# Patient Record
Sex: Female | Born: 1955 | Race: Black or African American | Hispanic: No | Marital: Married | Smoking: Former smoker
Health system: Southern US, Community
[De-identification: ages and names within clinical notes are randomized; demographics above are authoritative.]

## PROBLEM LIST (undated history)

## (undated) DIAGNOSIS — G5711 Meralgia paresthetica, right lower limb: Secondary | ICD-10-CM

## (undated) DIAGNOSIS — K219 Gastro-esophageal reflux disease without esophagitis: Secondary | ICD-10-CM

## (undated) DIAGNOSIS — F419 Anxiety disorder, unspecified: Secondary | ICD-10-CM

## (undated) DIAGNOSIS — F32A Depression, unspecified: Secondary | ICD-10-CM

## (undated) DIAGNOSIS — I10 Essential (primary) hypertension: Secondary | ICD-10-CM

## (undated) DIAGNOSIS — F329 Major depressive disorder, single episode, unspecified: Secondary | ICD-10-CM

## (undated) HISTORY — DX: Essential (primary) hypertension: I10

## (undated) HISTORY — DX: Depression, unspecified: F32.A

## (undated) HISTORY — DX: Major depressive disorder, single episode, unspecified: F32.9

## (undated) HISTORY — DX: Anxiety disorder, unspecified: F41.9

## (undated) HISTORY — DX: Meralgia paresthetica, right lower limb: G57.11

## (undated) HISTORY — DX: Gastro-esophageal reflux disease without esophagitis: K21.9

## (undated) HISTORY — PX: KNEE CARTILAGE SURGERY: SHX688

---

## 2000-04-09 ENCOUNTER — Other Ambulatory Visit: Admission: RE | Admit: 2000-04-09 | Discharge: 2000-04-09 | Payer: Self-pay | Admitting: Obstetrics and Gynecology

## 2000-12-16 ENCOUNTER — Emergency Department (HOSPITAL_COMMUNITY): Admission: EM | Admit: 2000-12-16 | Discharge: 2000-12-16 | Payer: Self-pay | Admitting: Emergency Medicine

## 2001-06-30 ENCOUNTER — Other Ambulatory Visit: Admission: RE | Admit: 2001-06-30 | Discharge: 2001-06-30 | Payer: Self-pay | Admitting: Gynecology

## 2002-08-02 ENCOUNTER — Other Ambulatory Visit: Admission: RE | Admit: 2002-08-02 | Discharge: 2002-08-02 | Payer: Self-pay | Admitting: Gynecology

## 2003-08-03 ENCOUNTER — Other Ambulatory Visit: Admission: RE | Admit: 2003-08-03 | Discharge: 2003-08-03 | Payer: Self-pay | Admitting: Gynecology

## 2004-08-08 ENCOUNTER — Other Ambulatory Visit: Admission: RE | Admit: 2004-08-08 | Discharge: 2004-08-08 | Payer: Self-pay | Admitting: Gynecology

## 2005-10-22 ENCOUNTER — Other Ambulatory Visit: Admission: RE | Admit: 2005-10-22 | Discharge: 2005-10-22 | Payer: Self-pay | Admitting: Gynecology

## 2006-11-13 ENCOUNTER — Other Ambulatory Visit: Admission: RE | Admit: 2006-11-13 | Discharge: 2006-11-13 | Payer: Self-pay | Admitting: Gynecology

## 2007-05-05 ENCOUNTER — Encounter (INDEPENDENT_AMBULATORY_CARE_PROVIDER_SITE_OTHER): Payer: Self-pay | Admitting: Orthopedic Surgery

## 2007-05-05 ENCOUNTER — Ambulatory Visit (HOSPITAL_BASED_OUTPATIENT_CLINIC_OR_DEPARTMENT_OTHER): Admission: RE | Admit: 2007-05-05 | Discharge: 2007-05-05 | Payer: Self-pay | Admitting: Orthopedic Surgery

## 2008-01-19 ENCOUNTER — Ambulatory Visit (HOSPITAL_BASED_OUTPATIENT_CLINIC_OR_DEPARTMENT_OTHER): Admission: RE | Admit: 2008-01-19 | Discharge: 2008-01-19 | Payer: Self-pay | Admitting: Orthopedic Surgery

## 2010-07-03 NOTE — Op Note (Signed)
[name], [name]                ACCOUNT NO.:  [id number]   MEDICAL RECORD NO.:  [id number]          PATIENT TYPE:  AMB   LOCATION:  DSC                          FACILITY:  [facility]   PHYSICIAN:  Cindee Salt, M.D.       DATE OF BIRTH:  May 19, 1955   DATE OF PROCEDURE:  05/05/2007  DATE OF DISCHARGE:                               OPERATIVE REPORT   PREOPERATIVE DIAGNOSIS:  Carpal tunnel syndrome right hand, stenosing  tenosynovitis right ring, right thumb.   POSTOPERATIVE DIAGNOSIS:  Carpal tunnel syndrome right hand, stenosing  tenosynovitis right ring, right thumb, plus flexor sheath cyst right  ring finger.   OPERATION:  Right carpal tunnel release, release of A1 pulley right ring  finger with excision of cyst, release A1 pulley right thumb.   SURGEON:  Cindee Salt, M.D.   ASSISTANT:  Carolyne Fiscal R.N.   ANESTHESIA:  Forearm based IV regional.   ANESTHESIOLOGIST:  Dr. Sampson Goon.   HISTORY:  The patient is a 55 year old female with a history of carpal  tunnel syndrome, EMG nerve conductions positive which has not responded  to conservative treatment.  She also has triggering of her thumb and  ring finger also not responsive.  She has elected to undergo surgical  decompression of each of these.  She is aware of risks and complications  including infection, recurrence, injury to arteries, nerves, tendons  incomplete relief of symptoms, dystrophy.  In the preoperative area the  patient is seen, questions encouraged and answered, the extremity marked  by both the patient and surgeon.  Antibiotic given.   PROCEDURE:  The patient is brought to the operating room where a forearm  based IV regional anesthetic was carried out without difficulty.  She  was prepped using DuraPrep, supine position, right arm free.  A  transverse incision was made over the A1 pulley of the thumb, carried  down through subcutaneous tissue.  Bleeders were electrocauterized.  Retractors were placed, protecting the  radial and ulnar digital artery  and nerve on the radial aspect of the A1 pulley.  A release was then  performed.  The oblique pulley was left intact.  The thumb placed  through full range motion, no further triggering was evident.  The wound  was irrigated.  The skin closed with interrupted 5-0 Vicryl Rapide  sutures.  An oblique incision was then made over the metacarpophalangeal  joint of the ring finger, carried down through subcutaneous tissue.  Retractors again placed, protecting neurovascular structures both  radially and ulnarly.  A release was then performed on the radial side  of the A1 pulley.  A large cyst was present.  This was excised and sent  to pathology.  A small incision was made centrally in the A2 pulley.  Again a flexion/extension of the finger revealed no further triggering.  The wound was irrigated and skin closed with interrupted 5-0 Vicryl  Rapide sutures.  A separate incision was then made longitudinally in the  palm just to the radial side of the ring finger, carried down through  subcutaneous tissue.  Bleeders again electrocauterized.  Palmar fascia  was split, superficial palmar arch identified, flexor tendon to the ring  finger identified to the ulnar side of the median nerve.  Carpal  retinaculum was incised with sharp dissection.  A right-angle and Sewall  retractor were placed between skin and forearm fascia.  The fascia was  then released for approximately 1.5 cm proximal to the wrist crease  under direct vision.  The canal was explored.  Area of compression of  median nerve was apparent.  Persistent median artery was also present.  This was not thrombosed.  The wound was irrigated.  The skin was then  closed with interrupted 5-0 Vicryl Rapide sutures.  Sterile compressive  dressing and splint to the wrist, fingers free, was applied.  The  patient tolerated the procedure well and was taken to the recovery room  for observation in satisfactory condition.   She will be discharged home  to return to [facility] [place] in one week on Vicodin.           ______________________________  Cindee Salt, M.D.     GK/MEDQ  D:  05/05/2007  T:  05/05/2007  Job:  [id number]

## 2010-07-03 NOTE — Op Note (Signed)
[name], [name]                ACCOUNT NO.:  [id number]   MEDICAL RECORD NO.:  [id number]          PATIENT TYPE:  AMB   LOCATION:  DSC                          FACILITY:  [facility]   PHYSICIAN:  Cindee Salt, M.D.       DATE OF BIRTH:  19-Oct-1955   DATE OF PROCEDURE:  01/19/2008  DATE OF DISCHARGE:                               OPERATIVE REPORT   PREOPERATIVE DIAGNOSES:  1. Carpal tunnel syndrome, left hand.  2. Stenosing tenosynovitis, left ring finger.   POSTOPERATIVE DIAGNOSES:  1. Carpal tunnel syndrome, left hand.  2. Stenosing tenosynovitis, left ring finger.   OPERATION:  Release of A1 pulley left ring finger, carpal tunnel release  left hand.   SURGEON:  Cindee Salt, MD   ASSISTANT:  Carolyne Fiscal, RN   ANESTHESIA:  General.   ANESTHESIOLOGIST:  Burna Forts, MD.   HISTORY:  The patient is a 55 year old female with a history of carpal  tunnel syndrome.  EMG nerve conduction is positive which has not  responded to conservative treatment.  She has elected to undergo  surgical decompression.  Postoperative course had been discussed along  with risks and complications.  She is aware that there is no guarantee  with the surgery, possibility of infection, recurrence of injury to  arteries, nerves, tendons, incomplete relief of symptoms, dystrophy, and  possible recurrence of the stenosing tenosynovitis.  In the preoperative  area, the patient is seen.  The extremity marked by both the patient and  surgeon.  Antibiotic given.   PROCEDURE:  The patient was brought to the operating room where a  general anesthetic was carried out without difficulty under the  direction of Dr. Jacklynn Bue.  She was prepped using DuraPrep in supine  position with the left arm free.  A time-out was taken.  A longitudinal  incision was made in the palm, carried down through subcutaneous tissue.  Bleeders were electrocauterized.  Palmar fascia was split.  Superficial  palmar arch identified.  The  flexor tendon to the ring and little finger  identified to the ulnar side of the median nerve.  The carpal  retinaculum was incised with sharp dissection.  Right angle and Sewall  retractor were placed between skin and forearm fascia.  The fascia  released for approximately 1.5 cm proximal to the wrist crease under  direct vision.  Canal was explored.  Area compression to the nerve was  apparent.  No further lesions were identified.  The wound was irrigated  and closed with interrupted 5-0 Vicryl Rapide sutures.  A separate  incision was then made over the A1 pulley.  Obliquely, the left ring  finger carried down through the subcutaneous tissue.  Bleeders again  electrocauterized with bipolar.  The dissection carried down to the  flexor sheath.  The A1 pulley was then released on its radial aspect.  A  small incision was made centrally in the A2 pulley.  No further lesions  were identified.  Finger placed through full range motion, no further  triggering was noted.  The wound was again irrigated and closed with  interrupted 5-0 Vicryl Rapide  sutures.  Sterile compressive dressing and splint to the wrist was  applied.  The patient tolerated the procedure well, was taken to the  recovery room for observation in satisfactory condition.  She will be  discharged home to return to the [facility] [place] in 1 week on  Vicodin.           ______________________________  Cindee Salt, M.D.     GK/MEDQ  D:  01/19/2008  T:  01/19/2008  Job:  [id number]

## 2010-11-12 LAB — BASIC METABOLIC PANEL
GFR calc non Af Amer: >60
Potassium: 3.9
Sodium: 137

## 2010-11-12 LAB — POCT HEMOGLOBIN-HEMACUE: Hemoglobin: 13.9

## 2016-10-14 ENCOUNTER — Ambulatory Visit (INDEPENDENT_AMBULATORY_CARE_PROVIDER_SITE_OTHER): Payer: Commercial Managed Care - PPO | Admitting: Family Medicine

## 2016-10-14 ENCOUNTER — Encounter: Payer: Self-pay | Admitting: Family Medicine

## 2016-10-14 VITALS — BP 140/82 | HR 89 | Temp 98.1°F | Resp 16 | Ht 62.0 in | Wt 178.2 lb

## 2016-10-14 DIAGNOSIS — I10 Essential (primary) hypertension: Secondary | ICD-10-CM | POA: Diagnosis not present

## 2016-10-14 DIAGNOSIS — F419 Anxiety disorder, unspecified: Secondary | ICD-10-CM

## 2016-10-14 DIAGNOSIS — G4701 Insomnia due to medical condition: Secondary | ICD-10-CM

## 2016-10-14 DIAGNOSIS — F339 Major depressive disorder, recurrent, unspecified: Secondary | ICD-10-CM

## 2016-10-14 MED ORDER — SERTRALINE HCL 100 MG PO TABS: 200.0000 mg | ORAL_TABLET | Freq: Every day | ORAL | 0 refills | Status: DC

## 2016-10-14 MED ORDER — AMLODIPINE BESYLATE 5 MG PO TABS: 5.0000 mg | ORAL_TABLET | Freq: Every day | ORAL | 1 refills | Status: DC

## 2016-10-14 NOTE — Progress Notes (Signed)
Subjective:     Patient ID: [name], female   DOB: [birth year], 61 y.o.   MRN: [id number]  Here as a new patient visit. Has been struggling with mood for some time now. Reports that has tremendous family stress. Has been on the zoloft for about 7 months. Takes trazodone for sleep. Uses xanax for helping with sleep at night. Reports that can take xanax up to two pills and still does not sleep.  Had been on Wellbutrin and Prozac in the past for depression. Has no interest in doing anything that used to enjoy doing. Reports that spends most of time just taking care of mom and then when she is not doing that, she does not have the motivation. Has two sisters that help to take care of mom. Reports that she has not told them how she feels or that she is suffering with depression. Has never thought of hurting herself. No alcohol or drug use.   Needs refill on BP medication. Has never had labs done to check thyroid. Has taken BP medication, has not had any problems with it. 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    Anxiety  Presents for initial visit. Episode onset: Has dealth with anxiety and depression for the past several years.  The problem has been gradually worsening. Symptoms include decreased concentration, depressed mood, insomnia and nervous/anxious behavior. Patient reports no chest pain, compulsions, dizziness, dry mouth, feeling of choking, irritability, malaise, muscle tension, nausea, palpitations, panic, shortness of breath or suicidal ideas. Symptoms occur occasionally. The severity of symptoms is mild. The symptoms are aggravated by family issues and social activities. The quality of sleep is poor. Nighttime awakenings: one to two.   There is no history of suicide attempts. Past treatments include benzodiazephines, SSRIs, non-SSRI antidepressants and lifestyle changes.  Depression         This is a recurrent problem.  The current  episode started more than 1 month ago.   The onset quality is gradual.   The problem occurs daily.  Associated symptoms include decreased concentration, fatigue, hopelessness, insomnia, decreased interest, body aches and sad.  Associated symptoms include no appetite change, no myalgias, no headaches, no indigestion and no suicidal ideas.     The symptoms are aggravated by family issues.  Past treatments include SSRIs - Selective serotonin reuptake inhibitors and SNRIs - Serotonin and norepinephrine reuptake inhibitors.  Compliance with treatment is good.  Previous treatment provided mild relief.  Risk factors include history of self-injury.   Past medical history includes chronic illness and anxiety.     Pertinent negatives include no thyroid problem and no suicide attempts. Hypertension  This is a chronic problem. The current episode started more than 1 year ago. The problem has been waxing and waning since onset. The problem is controlled (BP runs well when she takes her medication). Associated symptoms include anxiety. Pertinent negatives include no chest pain, headaches, orthopnea, palpitations, peripheral edema, shortness of breath or sweats. There are no associated agents to hypertension. Risk factors for coronary artery disease include family history, stress and sedentary lifestyle. Past treatments include calcium channel blockers. The current treatment provides moderate improvement. Compliance problems include diet, exercise and psychosocial issues.  There is no history of angina, kidney disease or CAD/MI. There is no history of chronic renal disease or a thyroid problem.   Review of Systems  Constitutional: Positive for fatigue. Negative for appetite change and irritability.  Eyes: Negative  for visual disturbance.  Respiratory: Negative for shortness of breath.   Cardiovascular: Negative for chest pain, palpitations and orthopnea.  Gastrointestinal: Negative for nausea.  Endocrine: Positive for  polyuria.  Musculoskeletal: Negative for myalgias.  Skin: Negative for rash.  Neurological: Negative for dizziness and headaches.  Psychiatric/Behavioral: Positive for decreased concentration and depression. Negative for suicidal ideas. The patient is nervous/anxious and has insomnia.    Past Medical History:  Diagnosis Date  . Anxiety   . Depression   . GERD (gastroesophageal reflux disease)   . Hypertension     There is no immunization history on file for this patient. Social History   Social History  . Marital status: Married    Spouse name: N/A  . Number of children: N/A  . Years of education: N/A   Occupational History  . Not on file.   Social History Main Topics  . Smoking status: Former Research scientist (life sciences)  . Smokeless tobacco: Never Used  . Alcohol use No  . Drug use: No  . Sexual activity: Yes    Birth control/ protection: Post-menopausal   Other Topics Concern  . Not on file   Social History Narrative   Married. 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 Spends every 3rd night at [facility]. Has been taking care of mother since 89.    Family History  Problem Relation Age of Onset  . Alzheimer's disease Mother   . Hypertension Sister   . Diabetes Maternal Uncle   . Hypertension Sister        Objective:   Physical Exam  Constitutional: She is oriented to person, place, and time. She appears well-developed and well-nourished. No distress.  HENT:  Head: Normocephalic.  Mouth/Throat: No oropharyngeal exudate.  Eyes: Pupils are equal, round, and reactive to light. Conjunctivae are normal.  Neck: Normal range of motion. Neck supple. No JVD present. No tracheal deviation present. No thyromegaly present.  Cardiovascular: Normal rate and regular rhythm.   Pulmonary/Chest: Effort normal and breath sounds normal. No respiratory distress. She has no wheezes. She exhibits no tenderness.  Abdominal: Soft. Bowel sounds are normal. She exhibits no distension.   Musculoskeletal: She exhibits no edema.  Lymphadenopathy:    She has no cervical adenopathy.  Neurological: She is alert and oriented to person, place, and time. No cranial nerve deficit.  Skin: Skin is warm and dry. No rash noted. She is not diaphoretic. No erythema.  Psychiatric: Her behavior is normal. Thought content normal. She is not agitated, not aggressive, not hyperactive, not slowed, not withdrawn and not actively hallucinating. Thought content is not paranoid and not delusional. Cognition and memory are not impaired. She does not express impulsivity or inappropriate judgment. She exhibits a depressed mood. She expresses no homicidal and no suicidal ideation. She expresses no suicidal plans and no homicidal plans. She exhibits normal recent memory and normal remote memory. She is attentive.   Vitals:   10/14/16 1441  BP: 140/82  Pulse: 89  Resp: 16  Temp: 98.1 F (36.7 C)  SpO2: 98%          Plan and Assessment:     1. Depression, recurrent ([place]),, uncontrolled -Discussed with patient in detail treatment of depression and side effects of depression. Discussed medication. Discussed risks and benefits of medications. Discussed that if patient felt like she wanted to harm herself or anyone else that she would call 911 or seek help. Patient voiced agreement and understanding. Discussed ways to help with depression. -Patient to start  walking each day as directed.  - sertraline (ZOLOFT) 100 MG tablet; Take 2 tablets (200 mg total) by mouth daily.  Dispense: 60 tablet; Refill: 0 Will increase the zoloft as directed.  -Has not been taking the trazodone. Will start with Trazodone 50 mg po qhs. She will not take more than xanax 1mg  po qhs. Will begin decreasing the xanax. Discussed in detail the risks of addiction, hablit forming, sedation, etc of xanax. Patient voiced understanding.   2. Anxiety -.secondary to family stress.Defers counseling at this time. Agrees to discuss with sisters  need for help and will consider other care options for her mother.  - sertraline (ZOLOFT) 100 MG tablet; Take 2 tablets (200 mg total) by mouth daily.  Dispense: 60 tablet; Refill: 0 -patient does not need refill of xanax at this time, but understands that this is not a good management for anxiety or sleep.   3. HTN, goal below 130/80  -Check BMP.  Diet and exercise discussed.  - amLODipine (NORVASC) 5 MG tablet; Take 1 tablet (5 mg total) by mouth daily.; Refill: 1  4. 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  Avoid caffeine.  Exercise early in the day. Suspect that insomnia is secondary to above.  Trazodone for sleep as directed. Has not been taking at all. So take, trazodone 50 mg, po qhs as directed.  Medication risks discussed with patient.    Suspect that fatigue is secondary to above problems. But check labs and follow up as directed.  Call with questions or concerns or worsening changes in mood.  Sleep hygiene discussed with patient.  Spend over 50% of OV counseling patient on medications, diet, sleep, exercise, depression, anxiety.

## 2016-10-16 ENCOUNTER — Telehealth: Payer: Self-pay | Admitting: Family Medicine

## 2016-10-16 DIAGNOSIS — I10 Essential (primary) hypertension: Secondary | ICD-10-CM

## 2016-10-16 DIAGNOSIS — Z79899 Other long term (current) drug therapy: Secondary | ICD-10-CM

## 2016-10-16 DIAGNOSIS — F419 Anxiety disorder, unspecified: Secondary | ICD-10-CM

## 2016-10-16 DIAGNOSIS — R5383 Other fatigue: Secondary | ICD-10-CM

## 2016-10-16 DIAGNOSIS — F339 Major depressive disorder, recurrent, unspecified: Secondary | ICD-10-CM

## 2016-10-16 MED ORDER — SERTRALINE HCL 100 MG PO TABS: 200.0000 mg | ORAL_TABLET | Freq: Every day | ORAL | 0 refills | Status: DC

## 2016-10-16 MED ORDER — AMLODIPINE BESYLATE 5 MG PO TABS: 5.0000 mg | ORAL_TABLET | Freq: Every day | ORAL | 1 refills | Status: DC

## 2016-10-16 NOTE — Telephone Encounter (Signed)
Called patient regarding message below. No answer, left generic message for patient to return call.   

## 2016-10-16 NOTE — Telephone Encounter (Signed)
Patient called [facility] and they dont have the BP medication and Zoloft.  Pt will be taking 200 mg of the Zoloft instead of 100 mg.  Was that changed?  Please advise.  She called them late yesterday.  Please let her know when this has been sent in.  Also patient was taking 2 xanax and 2 advil PM and instead last night she was to take 50 mg of trazadone at bedtime and 1 xanax 1mg  tablet at bedtime. She wants to let you know she did not sleep well at all.  Does not feel rested today.  [place] to leave VM

## 2016-10-16 NOTE — Telephone Encounter (Signed)
Medications refaxed to pharmacy. Message sent to PCP for review.

## 2016-10-16 NOTE — Telephone Encounter (Signed)
Please advise patient that she does need to decrease the xanax as we discussed. She is going to probably not sleep well for the first several nights, but the trazodone will help with her sleep and give her more benefit.  Please also try to do the relaxation and exercise if possible during the day to help with her sleep hygiene. Please keep follow up appointment and let us know if she is having any problems.

## 2016-10-17 NOTE — Telephone Encounter (Signed)
I have ordered the labs, please advise patient that she should be fasting when she gets the labs done. You can read her the labs that I have ordered. Thanks.

## 2016-10-17 NOTE — Telephone Encounter (Signed)
Patient informed of message below, verbalized understanding.  

## 2016-10-17 NOTE — Telephone Encounter (Signed)
Spoke to patient regarding all information below. She is planning on getting necessary labs 1 weeks prior to next visit. What labs need to be ordered for her?

## 2016-10-18 ENCOUNTER — Ambulatory Visit: Payer: Self-pay | Admitting: Family Medicine

## 2016-11-06 ENCOUNTER — Other Ambulatory Visit: Payer: Self-pay | Admitting: Family Medicine

## 2016-11-06 NOTE — Progress Notes (Signed)
Patient ID: [name], female    DOB: [birth year], 61 y.o.   MRN: [id number]  Chief Complaint  Patient presents with  . Follow-up  . Hypertension  . Depression    Allergies Patient has no allergy information on record.  Subjective:   [name] is a 61 y.o. female who presents to [facility] today.  HPI Here for follow up. Has been feeling a bit better. Taking zoloft 100 mg, two a day. Takes one xanax and two trazodone each night. Reports that is feeling better. Does not feel quite as stressed. Sleeping at night. The days after stays with mom during the night and there the next day, has to recover. Takes care of uncle too. When goes home the next day, usually messes around the house and does laundry/clean. Has not been doing any exercise. Still have knee issues where surgery was done. Waiting for attorney to call.   Here to discuss labs and recheck BP.   Hyperlipidemia  This is a new problem. This is a new diagnosis. The problem is uncontrolled. Recent lipid tests were reviewed and are high. Exacerbating diseases include obesity. She has no history of diabetes. Factors aggravating her hyperlipidemia include fatty foods. Pertinent negatives include no chest pain, focal sensory loss, focal weakness, leg pain, myalgias or shortness of breath. She is currently on no antihyperlipidemic treatment. Compliance problems include adherence to diet and adherence to exercise.  Risk factors for coronary artery disease include dyslipidemia, family history, hypertension, obesity, stress, a sedentary lifestyle and post-menopausal.    Past Medical History:  Diagnosis Date  . Anxiety   . Depression   . GERD (gastroesophageal reflux disease)   . Hypertension     Past Surgical History:  Procedure Laterality Date  . KNEE CARTILAGE SURGERY Right    Workers Compensation, fell out of chair at work.     Family History  Problem Relation Age of Onset  . Alzheimer's disease Mother     . Hypertension Sister   . Diabetes Maternal Uncle   . Hypertension Sister      Social History   Social History  . Marital status: Married    Spouse name: N/A  . Number of children: N/A  . Years of education: N/A   Social History Main Topics  . Smoking status: Former Research scientist (life sciences)  . Smokeless tobacco: Never Used  . Alcohol use No  . Drug use: No  . Sexual activity: Yes    Birth control/ protection: Post-menopausal   Other Topics Concern  . None   Social History Narrative   Married. 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 Spends every 3rd night at [facility] [employer]. Has been taking care of mother since 45.     Review of Systems  Respiratory: Negative for shortness of breath.   Cardiovascular: Negative for chest pain.  Musculoskeletal: Negative for myalgias.  Neurological: Negative for focal weakness.    Current Outpatient Prescriptions on File Prior to Visit  Medication Sig Dispense Refill  . ALPRAZolam (XANAX) 1 MG tablet Take 1 mg by mouth at bedtime as needed.  1  . aspirin EC 81 MG tablet Take 81 mg by mouth daily.    . Cholecalciferol (D3-1000) 1000 units capsule Take 4,000 Units by mouth daily.    . Multiple Vitamin (MULTIVITAMIN) capsule Take 1 capsule by mouth daily.     No current facility-administered medications on file prior to visit.     Objective:   BP 120/82 (BP Location:  Left Arm, Patient Position: Sitting, Cuff Size: Normal)   Pulse 91   Temp 97.8 F (36.6 C) (Other (Comment))   Resp 16   Ht 5\' 2"  (1.575 m)   Wt 180 lb 8 oz (81.9 kg)   SpO2 98%   BMI 33.01 kg/m   Physical Exam  Constitutional: She is oriented to person, place, and time. She appears well-developed and well-nourished.  HENT:  Head: Normocephalic and atraumatic.  Eyes: Pupils are equal, round, and reactive to light. EOM are normal.  Neck: Normal range of motion. Neck supple. No JVD present. No tracheal deviation present. No thyromegaly present.  Cardiovascular: Normal  rate and regular rhythm.   Pulmonary/Chest: Effort normal and breath sounds normal.  Lymphadenopathy:    She has no cervical adenopathy.  Neurological: She is alert and oriented to person, place, and time. No cranial nerve deficit.  Skin: Skin is warm and dry. No rash noted.  Psychiatric: She has a normal mood and affect. Her behavior is normal. Judgment and thought content normal.  Vitals reviewed.    Assessment and Plan   1. HTN, goal below 130/80. Lifestyle modifications discussed with patient including a diet emphasizing vegetables, fruits, and whole grains. Limiting intake of sodium to less than 2,400 mg per day.  Recommendations discussed include consuming low-fat dairy products, poultry, fish, legumes, non-tropical vegetable oils, and nuts; and limiting intake of sweets, sugar-sweetened beverages, and red meat. Discussed following a plan such as the Dietary Approaches to Stop Hypertension (DASH) diet. Patient to read up on this diet.  Continue norvasc qd. Patient counseled in detail regarding the risks of medication. Told to call or return to clinic if develop any worrisome signs or symptoms. Patient voiced understanding.    2. Insomnia, unspecified type Patient went back to her previous level of trazodone, which we discussed is an elevated dose, but she reports it is working. She will continue medication. Has decreased the xanax to 1 at bedtime. Now will decreased the xanax to 1/2 po qd. Understands the risks of xanax.  - traZODone (DESYREL) 100 MG tablet; Take two pills each night as directed.  Dispense: 180 tablet; Refill: 0  3. Hyperlipidemia LDL goal <100 Discussed cholesterol goals and ways to lower cholesterol. Would like to try diet modifications and recheck it in one month. Spent time discussing this with patient and lifestyle changes. Weight loss and exercise discussed. Future lab for lipid panel placed along with liver tests.   4. Need for immunization against  influenza  - Flu Vaccine QUAD 36+ mos IM  5.  Anxiety Improved. Continue the zoloft and trazodone. Relaxation and stress management discussed.   Return in about 2 months (around 01/12/2017) for cholesterol . Caren Macadam, MD 11/12/2016

## 2016-11-12 ENCOUNTER — Encounter: Payer: Self-pay | Admitting: Family Medicine

## 2016-11-12 ENCOUNTER — Telehealth: Payer: Self-pay

## 2016-11-12 ENCOUNTER — Ambulatory Visit (INDEPENDENT_AMBULATORY_CARE_PROVIDER_SITE_OTHER): Payer: Commercial Managed Care - PPO | Admitting: Family Medicine

## 2016-11-12 VITALS — BP 120/82 | HR 91 | Temp 97.8°F | Resp 16 | Ht 62.0 in | Wt 180.5 lb

## 2016-11-12 DIAGNOSIS — Z23 Encounter for immunization: Secondary | ICD-10-CM | POA: Diagnosis not present

## 2016-11-12 DIAGNOSIS — E785 Hyperlipidemia, unspecified: Secondary | ICD-10-CM | POA: Diagnosis not present

## 2016-11-12 DIAGNOSIS — F419 Anxiety disorder, unspecified: Secondary | ICD-10-CM

## 2016-11-12 DIAGNOSIS — F339 Major depressive disorder, recurrent, unspecified: Secondary | ICD-10-CM

## 2016-11-12 DIAGNOSIS — G4700 Insomnia, unspecified: Secondary | ICD-10-CM | POA: Diagnosis not present

## 2016-11-12 DIAGNOSIS — I10 Essential (primary) hypertension: Secondary | ICD-10-CM | POA: Diagnosis not present

## 2016-11-12 LAB — BASIC METABOLIC PANEL
CALCIUM: 9
CHLORIDE: 108
Carbon Dioxide, Total: 29
Creatine, Serum: 0.89
EGFR (African American): 82
EGFR (Non-African Amer.): 70
GLUCOSE: 94
POTASSIUM: 4
Sodium: 143
Urea Nitrogen: 8

## 2016-11-12 LAB — CBC WITH DIFFERENTIAL/PLATELET
BASOS ABS: 47
Basophils Absolute: 47 cells/uL (ref 0–200)
Basophils Relative: 0.5 %
Basophils: 0.5
EOS PCT: 2.2 %
Eosinophil: 2.2
Eosinophils Absolute: 207 cells/uL (ref 15–500)
Eosinophils Absolute: 207
HCT: 35.9 % (ref 35.0–45.0)
HCT: 36
Hemoglobin: 12 g/dL (ref 11.7–15.5)
Hemoglobin: 12
LYMPHO ABS: 1570 /uL
LYMPHS PCT: 16.7
Lymphs Abs: 1570 cells/uL (ref 850–3900)
MCH: 27.8 pg (ref 27.0–33.0)
MCH: 27.8
MCHC: 33.4 g/dL (ref 32.0–36.0)
MCHC: 33.4
MCV: 83.1 fL (ref 80.0–100.0)
MCV: 83.1
MONO ABS: 498
MPV: 9.2 fL (ref 7.5–12.5)
MPV: 9.2 fL (ref 7.5–11.5)
Monocytes Relative: 5.3 %
Monocytes: 5.3
NEUTROS PCT: 75.3
Neutro Abs: 7078 cells/uL (ref 1500–7800)
Neutrophils Absolute: 7078 /uL
Neutrophils Relative %: 75.3 %
PLATELET COUNT: 317
PLATELETS: 317 10*3/uL (ref 140–400)
RBC: 4.32 10*6/uL (ref 3.80–5.10)
RDW: 13.6 % (ref 11.0–15.0)
RDW: 13.6
Red Blood Cell Count: 4.32
TOTAL LYMPHOCYTE: 16.7 %
WBC mixed population: 498 cells/uL (ref 200–950)
WBC: 9.4 10*3/uL (ref 3.8–10.8)
White Blood Cells: 9.4

## 2016-11-12 LAB — HEMOGLOBIN A1C
EAG (MMOL/L): 6.2 (calc)
EAG (MMOL/L): 6.2
HEMOGLOBIN A1C: 5.5
Hgb A1c MFr Bld: 5.5 % of total Hgb (ref <5.7)
Mean Plasma Glucose: 111 (calc)

## 2016-11-12 LAB — TSH
TSH: 1.23 m[IU]/L (ref 0.40–4.50)
TSH: 1.23

## 2016-11-12 LAB — LIPID PANEL
CHOLESTEROL: 217 mg/dL — AB (ref <200)
Cholesterol, Total: 217
HDL Cholesterol: 61
HDL: 61 mg/dL (ref >50)
LDL CALC: 140
LDL Cholesterol (Calc): 140 mg/dL (calc) — ABNORMAL HIGH
NON-HDL CHOLESTEROL (CALC): 156 mg/dL — AB (ref <130)
Non HDL Cholesterol: 156
Total CHOL/HDL Ratio: 3.6 (calc) (ref <5.0)
Total CHOL/HDL Ratio: 3.6
Triglycerides: 66 mg/dL (ref <150)
Triglycerides: 66

## 2016-11-12 LAB — BASIC METABOLIC PANEL WITH GFR
BUN: 8 mg/dL (ref 7–25)
CALCIUM: 9 mg/dL (ref 8.6–10.4)
CHLORIDE: 108 mmol/L (ref 98–110)
CO2: 29 mmol/L (ref 20–32)
CREATININE: 0.89 mg/dL (ref 0.50–0.99)
GFR, EST AFRICAN AMERICAN: 82 mL/min/{1.73_m2} (ref >=60)
GFR, Est Non African American: 70 mL/min/{1.73_m2} (ref >=60)
Glucose, Bld: 94 mg/dL (ref 65–99)
Potassium: 4 mmol/L (ref 3.5–5.3)
Sodium: 143 mmol/L (ref 135–146)

## 2016-11-12 MED ORDER — SERTRALINE HCL 100 MG PO TABS: 200.0000 mg | ORAL_TABLET | Freq: Every day | ORAL | 0 refills | Status: DC

## 2016-11-12 MED ORDER — AMLODIPINE BESYLATE 5 MG PO TABS: 5.0000 mg | ORAL_TABLET | Freq: Every day | ORAL | 1 refills | Status: DC

## 2016-11-12 MED ORDER — TRAZODONE HCL 100 MG PO TABS: ORAL_TABLET | ORAL | 0 refills | Status: DC

## 2016-11-12 NOTE — Patient Instructions (Addendum)
Cholesterol Cholesterol is a white, waxy, fat-like substance that is needed by the human body in small amounts. The liver makes all the cholesterol we need. Cholesterol is carried from the liver by the blood through the blood vessels. Deposits of cholesterol (plaques) may build up on blood vessel (artery) walls. Plaques make the arteries narrower and stiffer. Cholesterol plaques increase the risk for heart attack and stroke. You cannot feel your cholesterol level even if it is very high. The only way to know that it is high is to have a blood test. Once you know your cholesterol levels, you should keep a record of the test results. Work with your health care provider to keep your levels in the desired range. What do the results mean?  Total cholesterol is a rough measure of all the cholesterol in your blood.  LDL (low-density lipoprotein) is the "bad" cholesterol. This is the type that causes plaque to build up on the artery walls. You want this level to be low.  HDL (high-density lipoprotein) is the "good" cholesterol because it cleans the arteries and carries the LDL away. You want this level to be high.  Triglycerides are fat that the body can either burn for energy or store. High levels are closely linked to heart disease. What are the desired levels of cholesterol?  Total cholesterol below 200.  LDL below 100 for people who are at risk, below 70 for people at very high risk.  HDL above 40 is good. A level of 60 or higher is considered to be protective against heart disease.  Triglycerides below 150. How can I lower my cholesterol? Diet Follow your diet program as told by your health care provider.  Choose fish or white meat chicken and [place], roasted or baked. Limit fatty cuts of red meat, fried foods, and processed meats, such as sausage and lunch meats.  Eat lots of fresh fruits and vegetables.  Choose whole grains, beans, pasta, potatoes, and cereals.  Choose olive oil, corn  oil, or canola oil, and use only small amounts.  Avoid butter, mayonnaise, shortening, or palm kernel oils.  Avoid foods with trans fats.  Drink skim or nonfat milk and eat low-fat or nonfat yogurt and cheeses. Avoid whole milk, cream, ice cream, egg yolks, and full-fat cheeses.  Healthier desserts include angel food cake, ginger snaps, animal crackers, hard candy, popsicles, and low-fat or nonfat frozen yogurt. Avoid pastries, cakes, pies, and cookies.  Exercise  Follow your exercise program as told by your health care provider. A regular program: ? Helps to decrease LDL and raise HDL. ? Helps with weight control.  Do things that increase your activity level, such as gardening, walking, and taking the stairs.  Ask your health care provider about ways that you can be more active in your daily life.  Medicine  Take over-the-counter and prescription medicines only as told by your health care provider. ? Medicine may be prescribed by your health care provider to help lower cholesterol and decrease the risk for heart disease. This is usually done if diet and exercise have failed to bring down cholesterol levels. ? If you have several risk factors, you may need medicine even if your levels are normal.  This information is not intended to replace advice given to you by your health care provider. Make sure you discuss any questions you have with your health care provider. Document Released: 10/30/2000 Document Revised: 09/02/2015 Document Reviewed: 08/05/2015 Elsevier Interactive Patient Education  2017 [place]. High Cholesterol  High cholesterol is a condition in which the blood has high levels of a white, waxy, fat-like substance (cholesterol). The human body needs small amounts of cholesterol. The liver makes all the cholesterol that the body needs. Extra (excess) cholesterol comes from the food that we eat. Cholesterol is carried from the liver by the blood through the blood vessels.  If you have high cholesterol, deposits (plaques) may build up on the walls of your blood vessels (arteries). Plaques make the arteries narrower and stiffer. Cholesterol plaques increase your risk for heart attack and stroke. Work with your health care provider to keep your cholesterol levels in a healthy range. What increases the risk? This condition is more likely to develop in people who:  Eat foods that are high in animal fat (saturated fat) or cholesterol.  Are overweight.  Are not getting enough exercise.  Have a family history of high cholesterol.  What are the signs or symptoms? There are no symptoms of this condition. How is this diagnosed? This condition may be diagnosed from the results of a blood test.  If you are older than age 44, your health care provider may check your cholesterol every 4-6 years.  You may be checked more often if you already have high cholesterol or other risk factors for heart disease.  The blood test for cholesterol measures:  "Bad" cholesterol (LDL cholesterol). This is the main type of cholesterol that causes heart disease. The desired level for LDL is less than 100.  "Good" cholesterol (HDL cholesterol). This type helps to protect against heart disease by cleaning the arteries and carrying the LDL away. The desired level for HDL is 60 or higher.  Triglycerides. These are fats that the body can store or burn for energy. The desired number for triglycerides is lower than 150.  Total cholesterol. This is a measure of the total amount of cholesterol in your blood, including LDL cholesterol, HDL cholesterol, and triglycerides. A healthy number is less than 200.  How is this treated? This condition is treated with diet changes, lifestyle changes, and medicines. Diet changes  This may include eating more whole grains, fruits, vegetables, nuts, and fish.  This may also include cutting back on red meat and foods that have a lot of added  sugar. Lifestyle changes  Changes may include getting at least 40 minutes of aerobic exercise 3 times a week. Aerobic exercises include walking, biking, and swimming. Aerobic exercise along with a healthy diet can help you maintain a healthy weight.  Changes may also include quitting smoking. Medicines  Medicines are usually given if diet and lifestyle changes have failed to reduce your cholesterol to healthy levels.  Your health care provider may prescribe a statin medicine. Statin medicines have been shown to reduce cholesterol, which can reduce the risk of heart disease. Follow these instructions at home: Eating and drinking  If told by your health care provider:  Eat chicken (without skin), fish, veal, shellfish, ground [place] breast, and round or loin cuts of red meat.  Do not eat fried foods or fatty meats, such as hot dogs and salami.  Eat plenty of fruits, such as apples.  Eat plenty of vegetables, such as broccoli, potatoes, and carrots.  Eat beans, peas, and lentils.  Eat grains such as barley, rice, couscous, and bulgur wheat.  Eat pasta without cream sauces.  Use skim or nonfat milk, and eat low-fat or nonfat yogurt and cheeses.  Do not eat or drink whole milk, cream, ice cream,  egg yolks, or hard cheeses.  Do not eat stick margarine or tub margarines that contain trans fats (also called partially hydrogenated oils).  Do not eat saturated tropical oils, such as coconut oil and palm oil.  Do not eat cakes, cookies, crackers, or other baked goods that contain trans fats.  General instructions  Exercise as directed by your health care provider. Increase your activity level with activities such as gardening, walking, and taking the stairs.  Take over-the-counter and prescription medicines only as told by your health care provider.  Do not use any products that contain nicotine or tobacco, such as cigarettes and e-cigarettes. If you need help quitting, ask your  health care provider.  Keep all follow-up visits as told by your health care provider. This is important. Contact a health care provider if:  You are struggling to maintain a healthy diet or weight.  You need help to start on an exercise program.  You need help to stop smoking. Get help right away if:  You have chest pain.  You have trouble breathing. This information is not intended to replace advice given to you by your health care provider. Make sure you discuss any questions you have with your health care provider. Document Released: 02/04/2005 Document Revised: 09/02/2015 Document Reviewed: 08/05/2015 Elsevier Interactive Patient Education  2017 [employer] Fat and Cholesterol Restricted Diet High levels of fat and cholesterol in your blood may lead to various health problems, such as diseases of the heart, blood vessels, gallbladder, liver, and pancreas. Fats are concentrated sources of energy that come in various forms. Certain types of fat, including saturated fat, may be harmful in excess. Cholesterol is a substance needed by your body in small amounts. Your body makes all the cholesterol it needs. Excess cholesterol comes from the food you eat. When you have high levels of cholesterol and saturated fat in your blood, health problems can develop because the excess fat and cholesterol will gather along the walls of your blood vessels, causing them to narrow. Choosing the right foods will help you control your intake of fat and cholesterol. This will help keep the levels of these substances in your blood within normal limits and reduce your risk of disease. What is my plan? Your health care provider recommends that you:  Limit your fat intake to ______% or less of your total calories per day.  Limit the amount of cholesterol in your diet to less than _________mg per day.  Eat 20-30 grams of fiber each day.  What types of fat should I choose?  Choose healthy fats more often.  Choose monounsaturated and polyunsaturated fats, such as olive and canola oil, flaxseeds, walnuts, almonds, and seeds.  Eat more omega-3 fats. Good choices include salmon, mackerel, sardines, tuna, flaxseed oil, and ground flaxseeds. Aim to eat fish at least two times a week.  Limit saturated fats. Saturated fats are primarily found in animal products, such as meats, butter, and cream. Plant sources of saturated fats include palm oil, palm kernel oil, and coconut oil.  Avoid foods with partially hydrogenated oils in them. These contain trans fats. Examples of foods that contain trans fats are stick margarine, some tub margarines, cookies, crackers, and other baked goods. What general guidelines do I need to follow? These guidelines for healthy eating will help you control your intake of fat and cholesterol:  Check food labels carefully to identify foods with trans fats or high amounts of saturated fat.  Fill one half of your plate  with vegetables and green salads.  Fill one fourth of your plate with whole grains. Look for the word "whole" as the first word in the ingredient list.  Fill one fourth of your plate with lean protein foods.  Limit fruit to two servings a day. Choose fruit instead of juice.  Eat more foods that contain fiber, such as apples, broccoli, carrots, beans, peas, and barley.  Eat more home-cooked food and less restaurant, buffet, and fast food.  Limit or avoid alcohol.  Limit foods high in starch and sugar.  Limit fried foods.  Cook foods using methods other than frying. Baking, boiling, grilling, and broiling are all great options.  Lose weight if you are overweight. Losing just 5-10% of your initial body weight can help your overall health and prevent diseases such as diabetes and heart disease.  What foods can I eat? Grains  Whole grains, such as whole wheat or whole grain breads, crackers, cereals, and pasta. Unsweetened oatmeal, bulgur, barley, quinoa, or  brown rice. Corn or whole wheat flour tortillas. Vegetables  Fresh or frozen vegetables (raw, steamed, roasted, or grilled). Green salads. Fruits  All fresh, canned (in natural juice), or frozen fruits. Meats and other protein foods  Ground beef (85% or leaner), grass-fed beef, or beef trimmed of fat. Skinless chicken or [place]. Ground chicken or [place]. Pork trimmed of fat. All fish and seafood. Eggs. Dried beans, peas, or lentils. Unsalted nuts or seeds. Unsalted canned or dry beans. Dairy  Low-fat dairy products, such as skim or 1% milk, 2% or reduced-fat cheeses, low-fat ricotta or cottage cheese, or plain low-fat yo Fats and oils  Tub margarines without trans fats. Light or reduced-fat mayonnaise and salad dressings. Avocado. Olive, canola, sesame, or safflower oils. Natural peanut or almond butter (choose ones without added sugar and oil). The items listed above may not be a complete list of recommended foods or beverages. Contact your dietitian for more options. Foods to avoid Grains  White bread. White pasta. White rice. Cornbread. Bagels, pastries, and croissants. Crackers that contain trans fat. Vegetables  White potatoes. Corn. Creamed or fried vegetables. Vegetables in a cheese sauce. Fruits  Dried fruits. Canned fruit in light or heavy syrup. Fruit juice. Meats and other protein foods  Fatty cuts of meat. Ribs, chicken wings, bacon, sausage, bologna, salami, chitterlings, fatback, hot dogs, bratwurst, and packaged luncheon meats. Liver and organ meats. Dairy  Whole or 2% milk, cream, half-and-half, and cream cheese. Whole milk cheeses. Whole-fat or sweetened yogurt. Full-fat cheeses. Nondairy creamers and whipped toppings. Processed cheese, cheese spreads, or cheese curds. Beverages  Alcohol. Sweetened drinks (such as sodas, lemonade, and fruit drinks or punches). Fats and oils  Butter, stick margarine, lard, shortening, ghee, or bacon fat. Coconut, palm kernel, or  palm oils. Sweets and desserts  Corn syrup, sugars, honey, and molasses. Candy. Jam and jelly. Syrup. Sweetened cereals. Cookies, pies, cakes, donuts, muffins, and ice cream. The items listed above may not be a complete list of foods and beverages to avoid. Contact your dietitian for more information. This information is not intended to replace advice given to you by your health care provider. Make sure you discuss any questions you have with your health care provider. Document Released: 02/04/2005 Document Revised: 02/25/2014 Document Reviewed: 05/05/2013 Elsevier Interactive Patient Education  2017 [employer].

## 2016-11-12 NOTE — Telephone Encounter (Signed)
Pt said she needs a 90 day rx for Zoloft and Norvasc.  She said the drug store told her the only rx they received was Trazodone.

## 2016-11-13 DIAGNOSIS — Z23 Encounter for immunization: Secondary | ICD-10-CM | POA: Insufficient documentation

## 2016-11-13 DIAGNOSIS — F419 Anxiety disorder, unspecified: Secondary | ICD-10-CM | POA: Insufficient documentation

## 2016-11-13 DIAGNOSIS — E785 Hyperlipidemia, unspecified: Secondary | ICD-10-CM | POA: Insufficient documentation

## 2016-11-13 DIAGNOSIS — G4700 Insomnia, unspecified: Secondary | ICD-10-CM | POA: Insufficient documentation

## 2016-11-13 DIAGNOSIS — I10 Essential (primary) hypertension: Secondary | ICD-10-CM | POA: Insufficient documentation

## 2016-11-15 ENCOUNTER — Telehealth: Payer: Self-pay | Admitting: Family Medicine

## 2016-11-15 NOTE — Telephone Encounter (Signed)
Patient is requesting a referral to Dr.Doonquah. She says she is having burning, numbness, and stinging around her knee/upper thigh. Cb#: [phone]

## 2016-11-19 NOTE — Telephone Encounter (Signed)
What type of doctor is he and where is he located? Gwen Her. Mannie Stabile, MD

## 2016-11-19 NOTE — Telephone Encounter (Signed)
[name] is neuro here in [place]

## 2016-11-20 ENCOUNTER — Telehealth: Payer: Self-pay | Admitting: Family Medicine

## 2016-11-20 DIAGNOSIS — M5432 Sciatica, left side: Secondary | ICD-10-CM

## 2016-11-20 DIAGNOSIS — M5431 Sciatica, right side: Secondary | ICD-10-CM

## 2016-11-20 NOTE — Telephone Encounter (Signed)
Patient called and left second message. See other message.

## 2016-11-20 NOTE — Telephone Encounter (Signed)
Ok. Please advise that I did the referral but she might want to see orthopedics here in [place], Dr. Aline Brochure or Luna Glasgow. We could probably get her in today or tomorrow. Advise. Request notes from hospital. Thanks.

## 2016-11-20 NOTE — Telephone Encounter (Signed)
REGARDING REFERRAL INFO REQUESTED BY PATIENT LAST WEEK...  Patient went to [facility] 11/18/16  (i will request note) due to the pain radiating from thigh into foot on right leg. ER Dr. Diagnosed her with sciatic nerve pain.  Her Orthopedic is out of the office until the end of the month so patient is requesting a referral to see Dr.Doonquah. The pain in unbearable so the sooner the appt the better.   Cb#: [phone]

## 2016-11-20 NOTE — Telephone Encounter (Signed)
Please call patient and advise that I would recommend orthopedics. Please get her an appointment with Dr. Aline Brochure /Dr. Luna Glasgow for tomorrow. Thanks.

## 2016-11-20 NOTE — Telephone Encounter (Signed)
Patient wants to know if it is better to see neuro or ortho,but is in agreement to go see ortho. She states tomorrow would be best because she is sitting with her mother today. She states ED Dr. Did not think pain was r/t to surgery though.

## 2016-11-20 NOTE — Telephone Encounter (Signed)
Please ask patient if this is related to the surgery that she had on her knee? Is orthopedics following her for this. If this is the knee that she had surgery on, she needs to follow up and discuss this with orthopedics. Gwen Her. Mannie Stabile, MD

## 2016-11-26 ENCOUNTER — Telehealth: Payer: Self-pay | Admitting: Orthopaedic Surgery

## 2016-11-26 ENCOUNTER — Encounter: Payer: Self-pay | Admitting: Family Medicine

## 2016-11-26 NOTE — Telephone Encounter (Signed)
Dr. Luna Glasgow reviewed notes and stated he would see this patient.  I called her to schedule an appointment and she said she originialy had an appointment with Dr. Alphonzo Cruise but it was not until the 29th.  She said Dr. Alphonzo Cruise had a cancellation and they called her and have scheduled her for the 16th.  She said she would just rather go ahead and see Dr. Alphonzo Cruise since she has seen him in the past.  I told her that was fine

## 2016-12-06 ENCOUNTER — Telehealth: Payer: Self-pay | Admitting: Family Medicine

## 2016-12-06 NOTE — Telephone Encounter (Signed)
Patient requesting Rx Xanax (she takes 1/2 tablet of the 1mg  at bedtime)  Dr. Scotty Court was the last to refill.  Please call in @ [facility] in [place].  She is completely out.  She is still taking the Trazodone, but without the xanax she was unable to sleep last night.  She is aware that Dr. Mannie Stabile is not in until Tuesday 23rd.

## 2016-12-09 ENCOUNTER — Telehealth: Payer: Self-pay | Admitting: Family Medicine

## 2016-12-09 NOTE — Telephone Encounter (Signed)
Patient was seen in the ER [facility] last night. Her complaint was heart palpitations.  She was not sure if it was from the Rx steroid given to her by the  orthopedist for her R Knee surgery.   She stopped the Rx yesterday. She states that EKG was done along with chest xray and blood work.  The ER doctor told her that everything looked normal.  She was unable to sleep last night and feels like this anxiety which she denies having felt like this before.  Patient is scheduled for appt this Friday but is requesting something sooner if at all possible. Please call patient and advise.

## 2016-12-09 NOTE — Telephone Encounter (Signed)
I will call patient to inform you are not prescribing xanax, unless otherwise told.

## 2016-12-09 NOTE — Telephone Encounter (Signed)
See if she can come in at 8 in the morning for a work in visit. I will come in early and see her. Gwen Her. Mannie Stabile, MD

## 2016-12-09 NOTE — Telephone Encounter (Signed)
Per Jackelyn Poling, she will be here at 8

## 2016-12-10 ENCOUNTER — Ambulatory Visit (INDEPENDENT_AMBULATORY_CARE_PROVIDER_SITE_OTHER): Payer: Commercial Managed Care - PPO | Admitting: Family Medicine

## 2016-12-10 ENCOUNTER — Encounter (INDEPENDENT_AMBULATORY_CARE_PROVIDER_SITE_OTHER): Payer: Self-pay

## 2016-12-10 ENCOUNTER — Other Ambulatory Visit: Payer: Self-pay

## 2016-12-10 ENCOUNTER — Encounter: Payer: Self-pay | Admitting: Family Medicine

## 2016-12-10 VITALS — BP 120/78 | HR 94 | Temp 97.2°F | Resp 16 | Ht 62.0 in | Wt 172.2 lb

## 2016-12-10 DIAGNOSIS — R002 Palpitations: Secondary | ICD-10-CM

## 2016-12-10 DIAGNOSIS — F99 Mental disorder, not otherwise specified: Secondary | ICD-10-CM

## 2016-12-10 DIAGNOSIS — G4700 Insomnia, unspecified: Secondary | ICD-10-CM

## 2016-12-10 DIAGNOSIS — F5105 Insomnia due to other mental disorder: Secondary | ICD-10-CM | POA: Diagnosis not present

## 2016-12-10 DIAGNOSIS — F419 Anxiety disorder, unspecified: Secondary | ICD-10-CM | POA: Diagnosis not present

## 2016-12-10 LAB — BASIC METABOLIC PANEL
BUN: 14 mg/dL (ref 7–25)
CO2: 30 mmol/L (ref 20–32)
CREATININE: 0.93 mg/dL (ref 0.50–0.99)
Calcium: 9.2 mg/dL (ref 8.6–10.4)
Chloride: 106 mmol/L (ref 98–110)
GLUCOSE: 107 mg/dL — AB (ref 65–99)
Potassium: 4 mmol/L (ref 3.5–5.3)
Sodium: 143 mmol/L (ref 135–146)

## 2016-12-10 MED ORDER — TRAZODONE HCL 100 MG PO TABS: 100.0000 mg | ORAL_TABLET | Freq: Every day | ORAL | 0 refills | Status: DC

## 2016-12-10 MED ORDER — METOPROLOL SUCCINATE ER 25 MG PO TB24: 25.0000 mg | ORAL_TABLET | Freq: Every day | ORAL | 0 refills | Status: DC

## 2016-12-10 MED ORDER — TEMAZEPAM 7.5 MG PO CAPS: 7.5000 mg | ORAL_CAPSULE | Freq: Every evening | ORAL | 0 refills | Status: DC | PRN

## 2016-12-10 NOTE — Progress Notes (Signed)
Patient ID: [name], female    DOB: 1955/12/08, 61 y.o.   MRN: [id number]  Chief Complaint  Patient presents with  . Palpitations    around day 4 of steroid, heart racing, went back to ED    Allergies Patient has no allergy information on record.  Subjective:   [name] is a 61 y.o. female who presents to [facility] today.  HPI Here for follow up after going to the ED. Went to the ED on 12/07/16 b/c was having palpitations. Had been seen at orthopedic the week before (10/16) and given steroids for her knee. Felt like the steroids were causing to feel bad. Started having palpitations and so sister took her to the ED at [facility] for evaluation. At the ED, they did CXR, EKG, blood work. Told her that everything was ok and gave her hydroxyzine. It did not help just made feel groggy.   Palpitations feel like heart is beating faster. Heart will be beating at a regular rhythm and then speed up and skip a beat. Last for a few seconds and then it goes back to regular rhythm, can last a bit longer. Occurs throughout the day. Nothing seems to make it better or worse. Can occur when thinking about stress in life or just going about normal life. Does not make SOB, nauseated, and no associated CP.   Has felt stressed lately with life situation and caring for mother and her uncle. Eating well. Does not feel depressed. Taking the zoloft. Has not had much sleep b/c weaned off xanax as we discussed and she ran out of it. Sleep schedule can be sporadic on the nights she has to stay and take care of mother and uncle. Is trying to exercise and take care of herself. Drinks one cup of coffee and a diet coke each day. Is taking trazodone for sleep each night but it does not seem to help. Has trouble initiating sleep and staying asleep. Does not drink alcohol or do drugs. No tobacco use.   Labs reviewed from [facility] ED and K of 3.2, all other labs within normal limits.     Palpitations    This is a new problem. The current episode started 1 to 4 weeks ago. The problem occurs 2 to 4 times per day (sometimes more frequently). The problem has been gradually improving (have decresed some in frequency since stopping steroid). The symptoms are aggravated by unknown. Associated symptoms include an irregular heartbeat. Pertinent negatives include no chest fullness, chest pain, coughing, diaphoresis, dizziness, nausea, near-syncope, shortness of breath, syncope, vomiting or weakness. Treatments tried: was given hydroxyzine but no help. The treatment provided no relief. Risk factors include obesity, post menopause and stress. Her past medical history is significant for anxiety. There is no history of hyperthyroidism or a valve disorder.    Past Medical History:  Diagnosis Date  . Anxiety   . Depression   . GERD (gastroesophageal reflux disease)   . Hypertension     Past Surgical History:  Procedure Laterality Date  . KNEE CARTILAGE SURGERY Right    Workers Compensation, fell out of chair at work.     Family History  Problem Relation Age of Onset  . Alzheimer's disease Mother   . Hypertension Sister   . Diabetes Maternal Uncle   . Hypertension Sister      Social History   Social History  . Marital status: Married    Spouse name: N/A  . Number of  children: N/A  . Years of education: N/A   Social History Main Topics  . Smoking status: Former Research scientist (life sciences)  . Smokeless tobacco: Never Used  . Alcohol use No  . Drug use: No  . Sexual activity: Yes    Birth control/ protection: Post-menopausal   Other Topics Concern  . None   Social History Narrative   Married. 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 Spends every 3rd night at [employer]. Has been taking care of mother since 50.     Review of Systems  Constitutional: Negative for diaphoresis.  Respiratory: Negative for cough and shortness of breath.   Cardiovascular: Positive for palpitations. Negative for  chest pain, syncope and near-syncope.  Gastrointestinal: Negative for nausea and vomiting.  Neurological: Negative for dizziness and weakness.     Objective:   BP 120/78 (BP Location: Left Arm, Patient Position: Sitting, Cuff Size: Normal)   Pulse 94   Temp (!) 97.2 F (36.2 C) (Other (Comment))   Resp 16   Ht 5\' 2"  (1.575 m)   Wt 172 lb 4 oz (78.1 kg)   SpO2 96%   BMI 31.50 kg/m   Physical Exam  Constitutional: She is oriented to person, place, and time. She appears well-developed and well-nourished. No distress.  HENT:  Head: Normocephalic and atraumatic.  Eyes: Pupils are equal, round, and reactive to light.  Neck: Normal range of motion. Neck supple. No JVD present. No tracheal deviation present. No thyromegaly present.  Cardiovascular: Normal rate, regular rhythm and normal heart sounds.   No murmur heard. Pulmonary/Chest: Effort normal and breath sounds normal. No respiratory distress.  Lymphadenopathy:    She has no cervical adenopathy.  Neurological: She is alert and oriented to person, place, and time. No cranial nerve deficit.  Skin: Skin is warm and dry. She is not diaphoretic.  Psychiatric: She has a normal mood and affect. Her behavior is normal. Judgment and thought content normal.  Nursing note and vitals reviewed.  EKG done and reviewed, NSR.    Assessment and Plan   1. Palpitations Uncertain etiology, refer to cardiology for heart monitor/recorder and evaluation.  TSH was WNL in 9/18. Recheck potassium that was 3.2 at ED. CBC was WNL.  D/c the amlodipine and start metoprolol.  Possible associated with anxiety but need to rule out electrical issue with heart.  Counseled regarding worrisome s/s and if develop to the ED. Reassurance given today.  - metoprolol succinate (TOPROL-XL) 25 MG 24 hr tablet; Take 1 tablet (25 mg total) by mouth daily.  Dispense: 30 tablet; Refill: 0 - Basic Metabolic Panel (BMET) - Ambulatory referral to Cardiology  2. Anxiety/  Insomnia, unspecified type, likely due to stress/anxiety and sleep/wake cycle disturbance.   - traZODone (DESYREL) 100 MG tablet; Take 1 tablet (100 mg total) by mouth at bedtime.   Dispense: 90 tablet; Refill: 0  - temazepam (RESTORIL) 7.5 MG capsule; Take 1 capsule (7.5 mg total) by mouth at bedtime as needed for sleep.  Dispense: 30 capsule; Refill: 0 - traZODone (DESYREL) 100 MG tablet; Take 1 tablet (100 mg total) by mouth at bedtime. Take two pills each night as directed.  Dispense: 90 tablet; Refill: 0 Patient counseled in detail regarding the risks of medication. Told to call or return to clinic if develop any worrisome signs or symptoms. Patient voiced understanding.   Sleep hygiene discussed with patient in detail. Risk of this medication and abuse potential discussed.  Patient defers counseling at this time due to  time constraints.  Continue zoloft.  Follow up in 2-4 weeks.   Return in about 4 weeks (around 01/07/2017). Caren Macadam, MD 12/10/2016

## 2016-12-10 NOTE — Patient Instructions (Signed)
Stop the amlodipine for Blood pressure and start the metoprolol er 25 mg a day  You may use the retoril/temazepam for sleep at night if needed Just take one of the trazodone at night, every night , for sleep    Tell the lab, only check BMP today-not cholesterol and liver those are future orders.

## 2016-12-12 ENCOUNTER — Encounter: Payer: Self-pay | Admitting: Family Medicine

## 2016-12-13 ENCOUNTER — Ambulatory Visit: Payer: Self-pay | Admitting: Family Medicine

## 2016-12-13 ENCOUNTER — Encounter: Payer: Self-pay | Admitting: Family Medicine

## 2016-12-13 NOTE — Telephone Encounter (Signed)
Please call patient and put her on the open slot for Tuesday. I have to see and evaluate her for this pain before I can do a referral. We need to get records from Dr. Case. Advise her that if she wants to call him and have him do referral or MRI that would be fine, but I need to see her for this problem before doing referral. Thanks. Gwen Her. Mannie Stabile, MD

## 2016-12-13 NOTE — Telephone Encounter (Signed)
I called patient and left a message, per DPR, and informed of this. I went ahead and scheduled patient so that appointment would not be taken. She is to call back and confirm or cancel

## 2016-12-17 ENCOUNTER — Ambulatory Visit: Payer: Self-pay | Admitting: Family Medicine

## 2016-12-25 ENCOUNTER — Ambulatory Visit: Payer: Self-pay | Admitting: Cardiovascular Disease

## 2016-12-27 ENCOUNTER — Other Ambulatory Visit: Payer: Self-pay

## 2016-12-27 ENCOUNTER — Ambulatory Visit (INDEPENDENT_AMBULATORY_CARE_PROVIDER_SITE_OTHER): Payer: Commercial Managed Care - PPO | Admitting: Family Medicine

## 2016-12-27 ENCOUNTER — Encounter: Payer: Self-pay | Admitting: Family Medicine

## 2016-12-27 VITALS — BP 134/82 | HR 68 | Temp 97.7°F | Resp 16 | Ht 62.0 in | Wt 178.8 lb

## 2016-12-27 DIAGNOSIS — M79609 Pain in unspecified limb: Secondary | ICD-10-CM | POA: Diagnosis not present

## 2016-12-27 DIAGNOSIS — M25572 Pain in left ankle and joints of left foot: Secondary | ICD-10-CM | POA: Diagnosis not present

## 2016-12-27 DIAGNOSIS — R202 Paresthesia of skin: Secondary | ICD-10-CM | POA: Diagnosis not present

## 2016-12-27 MED ORDER — GABAPENTIN 100 MG PO CAPS: 100.0000 mg | ORAL_CAPSULE | Freq: Two times a day (BID) | ORAL | 1 refills | Status: DC

## 2016-12-27 NOTE — Progress Notes (Signed)
Patient ID: [name], female    DOB: 08-11-1955, 61 y.o.   MRN: [id number]  Chief Complaint  Patient presents with  . Leg Pain    right leg; burning, tingling, numbness  . Edema    left ankle    Allergies Patient has no known allergies.  Subjective:   [name] is a 62 y.o. female who presents to [facility] today.  HPI Here for follow up. Reports that has right leg pain that radiates down to the toes on the right foot. Has never had this type of pain until after the knee surgery. Has had this pain since August/September. The pain is severe and causes her significant distress. Reports that the pain is burning in quality with associated numbness and paresthesias. Is not having any back pain. No muscle pain in back. Has had some pain/discomfort sensation in the right gluteal region.  Can walk normally. Ice has helped it but the pain sensations come back and bother her everyday. No change in muscle strength.   Has had some edema in her left ankle for two days. Has not had this in the past. Bother a little bit to walk b/c it hurts. Took advil tablet and it did not help. No falls or injury to the left foot/ankle.   Leg Pain   The incident occurred more than 1 week ago. There was no injury mechanism. The pain is present in the right thigh, right foot and right toes. The quality of the pain is described as burning. The pain is at a severity of 7/10. The pain is moderate. The pain has been fluctuating since onset. Associated symptoms include numbness and tingling. Pertinent negatives include no inability to bear weight, loss of motion or muscle weakness. Nothing aggravates the symptoms. She has tried acetaminophen, heat, ice, NSAIDs and rest for the symptoms. The treatment provided mild relief.  Ankle Pain   The incident occurred 2 days ago. There was no injury mechanism. The pain is present in the left ankle. The quality of the pain is described as aching. The pain is at a  severity of 1/10. The pain is mild. The pain has been fluctuating since onset. Associated symptoms include numbness and tingling. Pertinent negatives include no inability to bear weight, loss of motion or muscle weakness. The symptoms are aggravated by movement. She has tried acetaminophen for the symptoms. The treatment provided mild relief.    Past Medical History:  Diagnosis Date  . Anxiety   . Depression   . GERD (gastroesophageal reflux disease)   . Hypertension     Past Surgical History:  Procedure Laterality Date  . KNEE CARTILAGE SURGERY Right    Workers Compensation, fell out of chair at work.     Family History  Problem Relation Age of Onset  . Alzheimer's disease Mother   . Hypertension Sister   . Diabetes Maternal Uncle   . Hypertension Sister      Social History   Socioeconomic History  . Marital status: Married    Spouse name: None  . Number of children: None  . Years of education: None  . Highest education level: None  Social Needs  . Financial resource strain: None  . Food insecurity - worry: None  . Food insecurity - inability: None  . Transportation needs - medical: None  . Transportation needs - non-medical: None  Occupational History  . None  Tobacco Use  . Smoking status: Former Research scientist (life sciences)  . Smokeless tobacco: Never  Used  Substance and Sexual Activity  . Alcohol use: No  . Drug use: No  . Sexual activity: Yes    Birth control/protection: Post-menopausal  Other Topics Concern  . None  Social History Narrative   Married. 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 Spends every 3rd night at [facility] [employer]. Has been taking care of mother since 8.    Current Outpatient Medications on File Prior to Visit  Medication Sig Dispense Refill  . aspirin EC 81 MG tablet Take 81 mg by mouth daily.    . Cholecalciferol (D3-1000) 1000 units capsule Take 4,000 Units by mouth daily.    . hydrOXYzine (ATARAX/VISTARIL) 25 MG tablet Take 25 mg by mouth 3  (three) times daily as needed.    . metoprolol succinate (TOPROL-XL) 25 MG 24 hr tablet Take 1 tablet (25 mg total) by mouth daily. 30 tablet 0  . Multiple Vitamin (MULTIVITAMIN) capsule Take 1 capsule by mouth daily.    . sertraline (ZOLOFT) 100 MG tablet Take 2 tablets (200 mg total) by mouth daily. 60 tablet 0  . temazepam (RESTORIL) 7.5 MG capsule Take 1 capsule (7.5 mg total) by mouth at bedtime as needed for sleep. 30 capsule 0  . traZODone (DESYREL) 100 MG tablet Take 1 tablet (100 mg total) by mouth at bedtime. Take two pills each night as directed. 90 tablet 0   No current facility-administered medications on file prior to visit.     Review of Systems  Constitutional: Negative for appetite change, chills, diaphoresis, fatigue and unexpected weight change.  HENT: Negative for trouble swallowing.   Eyes: Negative for visual disturbance.  Respiratory: Negative for cough.   Cardiovascular: Negative for chest pain and leg swelling.  Gastrointestinal: Negative for abdominal distention, abdominal pain, diarrhea and vomiting.  Genitourinary: Negative for decreased urine volume, difficulty urinating, dysuria, flank pain, frequency, hematuria and urgency.  Musculoskeletal: Positive for arthralgias and joint swelling. Negative for gait problem, neck pain and neck stiffness.  Skin: Negative for rash.  Neurological: Positive for tingling and numbness. Negative for dizziness, tremors, syncope, speech difficulty, light-headedness and headaches.  Psychiatric/Behavioral: Negative for agitation and behavioral problems. The patient is not nervous/anxious.      Objective:   BP 134/82 (BP Location: Left Arm, Patient Position: Sitting, Cuff Size: Normal)   Pulse 68   Temp 97.7 F (36.5 C) (Other (Comment))   Resp 16   Ht 5\' 2"  (1.575 m)   Wt 178 lb 12 oz (81.1 kg)   SpO2 98%   BMI 32.69 kg/m   Physical Exam  Constitutional: She appears well-developed and well-nourished.  HENT:  Head:  Normocephalic and atraumatic.  Eyes: EOM are normal. Pupils are equal, round, and reactive to light.  Neck: Normal range of motion. Neck supple. No thyromegaly present.  Cardiovascular: Normal rate, regular rhythm and normal heart sounds.  Pulmonary/Chest: Effort normal and breath sounds normal.  Musculoskeletal:       Lumbar back: She exhibits tenderness and pain. She exhibits normal range of motion, no bony tenderness, no edema, no deformity and no spasm.       Left foot: There is tenderness, bony tenderness, swelling and deformity. There is normal range of motion, normal capillary refill, no crepitus and no laceration.  Hammer toes on left foot. Mild bony tenderness at left ankle joint. Mild tenderness with plantar flexion, dorsiflexion, and eversion of foot at ankle.  No bony tenderness to palpation.   Mild pain with palpation over the gluteal region  at the L4 region and below.   Neurological: She is alert. She has normal strength. She is not disoriented. She displays no atrophy and no tremor. No cranial nerve deficit or sensory deficit. She exhibits normal muscle tone.  Reflex Scores:      Patellar reflexes are 1+ on the right side and 2+ on the left side. No sensation difference b/w left and right in the LE.   Skin: Skin is warm and dry.  Psychiatric: She has a normal mood and affect. Her behavior is normal. Thought content normal.  Vitals reviewed.     Assessment and Plan  1. Paresthesia and pain of right extremity Prescription given for gabapentin 100 mg, 1 p.o. twice daily as directed. Patient counseled in detail regarding the risks of medication. Told to call or return to clinic if develop any worrisome signs or symptoms. Patient voiced understanding.  We will proceed with testing at this time and follow-up after results.  Patient was counseled concerning worrisome signs and symptoms of paresthesias and back pain.  She was told if those develop to call office or go to the  emergency department. - MR LUMBAR SPINE W WO CONTRAST; Future  2. Acute left ankle pain  Suspect pain in ankle at this time is secondary to a strain will defer x-rays at this time.  Patient is having very minimal discomfort and able to bear weight with no problem. Patient told to use ice or heat 20 minutes several times a day. She was told to elevate ankle and leg as directed. She was told to call with any questions or concerns. Handouts given and questions addressed. She was instructed on range of motion exercises to perform 3 times a day.  Return in about 4 weeks (around 01/24/2017). Caren Macadam, MD 12/27/2016

## 2016-12-27 NOTE — Patient Instructions (Signed)
Ankle Sprain An ankle sprain is a stretch or tear in one of the tough, fiber-like tissues (ligaments) in the ankle. The ligaments in your ankle help to hold the bones of the ankle together. What are the causes? This condition is often caused by stepping on or falling on the outer edge of the foot. What increases the risk? This condition is more likely to develop in people who play sports. What are the signs or symptoms? Symptoms of this condition include:  Pain in your ankle.  Swelling.  Bruising. Bruising may develop right after you sprain your ankle or 1-2 days later.  Trouble standing or walking, especially when you turn or change directions.  How is this diagnosed? This condition is diagnosed with a physical exam. During the exam, your health care provider will press on certain parts of your foot and ankle and try to move them in certain ways. X-rays may be taken to see how severe the sprain is and to check for broken bones. How is this treated? This condition may be treated with:  A brace. This is used to keep the ankle from moving until it heals.  An elastic bandage. This is used to support the ankle.  Crutches.  Pain medicine.  Surgery. This may be needed if the sprain is severe.  Physical therapy. This may help to improve the range of motion in the ankle.  Follow these instructions at home:  Rest your ankle.  Take over-the-counter and prescription medicines only as told by your health care provider.  For 2-3 days, keep your ankle raised (elevated) above the level of your heart as much as possible.  If directed, apply ice to the area: ? Put ice in a plastic bag. ? Place a towel between your skin and the bag. ? Leave the ice on for 20 minutes, 2-3 times a day.  If you were given a brace: ? Wear it as directed. ? Remove it to shower or bathe. ? Try not to move your ankle much, but wiggle your toes from time to time. This helps to prevent swelling.  If you were  given an elastic bandage (dressing): ? Remove it to shower or bathe. ? Try not to move your ankle much, but wiggle your toes from time to time. This helps to prevent swelling. ? Adjust the dressing to make it more comfortable if it feels too tight. ? Loosen the dressing if you have numbness or tingling in your foot, or if your foot becomes cold and blue.  If you have crutches, use them as told by your health care provider. Continue to use them until you can walk without feeling pain in your ankle. Contact a health care provider if:  You have rapidly increasing bruising or swelling.  Your pain is not relieved with medicine. Get help right away if:  Your toes or foot becomes numb or blue.  You have severe pain that gets worse. This information is not intended to replace advice given to you by your health care provider. Make sure you discuss any questions you have with your health care provider. Document Released: 02/04/2005 Document Revised: 06/14/2015 Document Reviewed: 09/06/2014 Elsevier Interactive Patient Education  2017 [employer]. Ankle Pain Many things can cause ankle pain, including an injury to the area and overuse of the ankle.The ankle joint holds your body weight and allows you to move around. Ankle pain can occur on either side or the back of one ankle or both ankles. Ankle pain may  be sharp and burning or dull and aching. There may be tenderness, stiffness, redness, or warmth around the ankle. Follow these instructions at home: Activity  Rest your ankle as told by your health care provider. Avoid any activities that cause ankle pain.  Do exercises as told by your health care provider.  Ask your health care provider if you can drive. Using a brace, a bandage, or crutches  If you were given a brace: ? Wear it as told by your health care provider. ? Remove it when you take a bath or a shower. ? Try not to move your ankle very much, but wiggle your toes from time to  time. This helps to prevent swelling.  If you were given an elastic bandage: ? Remove it when you take a bath or a shower. ? Try not to move your ankle very much, but wiggle your toes from time to time. This helps to prevent swelling. ? Adjust the bandage to make it more comfortable if it feels too tight. ? Loosen the bandage if you have numbness or tingling in your foot or if your foot turns cold and blue.  If you have crutches, use them as told by your health care provider. Continue to use them until you can walk without feeling pain in your ankle. Managing pain, stiffness, and swelling  Raise (elevate) your ankle above the level of your heart while you are sitting or lying down.  If directed, apply ice to the area: ? Put ice in a plastic bag. ? Place a towel between your skin and the bag. ? Leave the ice on for 20 minutes, 2-3 times per day. General instructions  Keep all follow-up visits as told by your health care provider. This is important.  Record this information that may be helpful for you and your health care provider: ? How often you have ankle pain. ? Where the pain is located. ? What the pain feels like.  Take over-the-counter and prescription medicines only as told by your health care provider. Contact a health care provider if:  Your pain gets worse.  Your pain is not relieved with medicines.  You have a fever or chills.  You are having more trouble with walking.  You have new symptoms. Get help right away if:  Your foot, leg, toes, or ankle tingles or becomes numb.  Your foot, leg, toes, or ankle becomes swollen.  Your foot, leg, toes, or ankle turns pale or blue. This information is not intended to replace advice given to you by your health care provider. Make sure you discuss any questions you have with your health care provider. Document Released: 07/25/2009 Document Revised: 10/06/2015 Document Reviewed: 09/06/2014 Elsevier Interactive Patient  Education  2017 [employer].

## 2016-12-31 ENCOUNTER — Ambulatory Visit (HOSPITAL_COMMUNITY): Payer: Commercial Managed Care - PPO

## 2017-01-02 ENCOUNTER — Ambulatory Visit (INDEPENDENT_AMBULATORY_CARE_PROVIDER_SITE_OTHER): Payer: Commercial Managed Care - PPO | Admitting: Cardiology

## 2017-01-02 ENCOUNTER — Telehealth: Payer: Self-pay | Admitting: *Deleted

## 2017-01-02 ENCOUNTER — Encounter: Payer: Self-pay | Admitting: Cardiology

## 2017-01-02 VITALS — BP 148/92 | HR 82 | Ht 62.0 in | Wt 176.0 lb

## 2017-01-02 DIAGNOSIS — I10 Essential (primary) hypertension: Secondary | ICD-10-CM

## 2017-01-02 DIAGNOSIS — F17201 Nicotine dependence, unspecified, in remission: Secondary | ICD-10-CM

## 2017-01-02 DIAGNOSIS — R002 Palpitations: Secondary | ICD-10-CM

## 2017-01-02 NOTE — Telephone Encounter (Signed)
Spoke to patient. Informed that [name] was received.

## 2017-01-02 NOTE — Progress Notes (Signed)
Cardiology Office Note  Date: 01/02/2017   ID: [name], DOB 04-25-1955, MRN [id number]  PCP: Caren Macadam, MD  Consulting Cardiologist: Rozann Lesches, MD   Chief Complaint  Patient presents with  . Palpitations    History of Present Illness: [name] is a 61 y.o. female referred for cardiology consultation by Dr. Mannie Stabile for the evaluation of palpitations.  She states that about 3 or 4 weeks ago she experienced an episode of shortness of breath and diaphoresis when she was indoors at her mother's home.  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  She went outside to catch her breath and felt a sense of rapid heartbeat, was taken to the ER, reportedly with no significant arrhythmia documented by telemetry or ECG.  She states that she has been under a lot of stress as a caregiver.  Since that event she has had no prolonged palpitations, only brief events.  These tend to occur when she is "stressed out."  She has no associated chest pain, lightheadedness, or syncope.  In reviewing the chart I see that she was changed to Norvasc to Toprol-XL back on October 23 related to palpitations.  I reviewed her recent ECG and lab work.  TSH was normal.  She states that she is compliant with her medications as outlined below.  Blood pressure was elevated today.  She checks it periodically at home.  She denies any known history of cardiac arrhythmias.  Past Medical History:  Diagnosis Date  . Anxiety   . Depression   . GERD (gastroesophageal reflux disease)   . Hypertension     Past Surgical History:  Procedure Laterality Date  . KNEE CARTILAGE SURGERY Right    Work[employer] Compensation, fell out of chair at work.     Current Outpatient Medications  Medication Sig Dispense Refill  . aspirin EC 81 MG tablet Take 81 mg by mouth daily.    . Cholecalciferol (D3-1000) 1000 units capsule Take 4,000 Units by mouth daily.    Marland Kitchen gabapentin (NEURONTIN) 100 MG  capsule Take 1 capsule (100 mg total) 2 (two) times daily by mouth. 60 capsule 1  . hydrOXYzine (ATARAX/VISTARIL) 25 MG tablet Take 25 mg by mouth 3 (three) times daily as needed.    . metoprolol succinate (TOPROL-XL) 25 MG 24 hr tablet Take 1 tablet (25 mg total) by mouth daily. 30 tablet 0  . Multiple Vitamin (MULTIVITAMIN) capsule Take 1 capsule by mouth daily.    . sertraline (ZOLOFT) 100 MG tablet Take 2 tablets (200 mg total) by mouth daily. 60 tablet 0  . temazepam (RESTORIL) 7.5 MG capsule Take 1 capsule (7.5 mg total) by mouth at bedtime as needed for sleep. 30 capsule 0  . traZODone (DESYREL) 100 MG tablet Take 1 tablet (100 mg total) by mouth at bedtime. Take two pills each night as directed. 90 tablet 0   No current facility-administered medications for this visit.    Allergies:  Patient has no known allergies.   Social History: The patient  reports that she has quit smoking. she has never used smokeless tobacco. She reports that she does not drink alcohol or use drugs.   Family History: The patient's family history includes Alzheimer's disease in her mother; Diabetes in her maternal uncle; Hypertension in her sister and sister.   ROS:  Please see the history of present illness. Otherwise, complete review of systems is positive for situational stress.  All other systems are reviewed  and negative.   Physical Exam: VS:  BP (!) 148/92   Pulse 82   Ht 5\' 2"  (1.575 m)   Wt 176 lb (79.8 kg)   SpO2 98%   BMI 32.19 kg/m , BMI Body mass index is 32.19 kg/m.  Wt Readings from Last 3 Encounters:  01/02/17 176 lb (79.8 kg)  12/27/16 178 lb 12 oz (81.1 kg)  12/10/16 172 lb 4 oz (78.1 kg)    General: Patient appears comfortable at rest. HEENT: Conjunctiva and lids normal, oropharynx clear. Neck: Supple, no elevated JVP or carotid bruits, no thyromegaly. Lungs: Clear to auscultation, nonlabored breathing at rest. Cardiac: Regular rate and rhythm, no S3 or significant systolic  murmur, no pericardial rub. Abdomen: Soft, nontender, bowel sounds present, no guarding or rebound. Extremities: No pitting edema, distal pulses 2+. Skin: Warm and dry. Musculoskeletal: No kyphosis. Neuropsychiatric: Alert and oriented x3, affect grossly appropriate.  ECG: I personally reviewed the tracing from 12/10/2016 which revealed sinus rhythm with possible left atrial enlargement and nonspecific ST changes.  Recent Labwork: 11/06/2016: Hemoglobin 12.0; Platelets 317; TSH 1.23; TSH 1.23 12/10/2016: BUN 14; Creat 0.93; Potassium 4.0; Sodium 143     Component Value Date/Time   CHOL 217 11/06/2016 1105   CHOL 217 (H) 11/06/2016 1105   TRIG 66 11/06/2016 1105   TRIG 66 11/06/2016 1105   HDL 61 11/06/2016 1105   HDL 61 11/06/2016 1105   CHOLHDL 3.6 11/06/2016 1105   CHOLHDL 3.6 11/06/2016 1105   LDLCALC 140 11/06/2016 1105    Assessment and Plan:  1.  Palpitations as discussed above.  No associated chest pain or syncope.  ECG reviewed and nonspecific.  TSH normal.  Patient admits to a significant amount of situational stress as caregiver for her mother with advanced dementia, also an uncle with chronic health problems.  Most palpitations seem to be associated with psychosocial stressors.  We will obtain a 7-day event monitor to exclude any obvious arrhythmias.  If this is reassuring, no further cardiac workup is planned.  2.  Essential hypertension, previously on Norvasc, switched to Toprol-XL in October.  Blood pressure is elevated today.  I asked her to keep a blood pressure log and follow-up with PCP to see if medication adjustments need to be made.  3.  Tobacco abuse in remission.  Current medicines were reviewed with the patient today.   Orders Placed This Encounter  Procedures  . Cardiac event monitor    Disposition: Call with test results.  Signed, Satira Sark, MD, [facility] 01/02/2017 10:58 AM    [facility] [place] [place], [place], [place] [address] Phone: [phone]; Fax: [phone]

## 2017-01-02 NOTE — Patient Instructions (Signed)
Medication Instructions:  Your physician recommends that you continue on your current medications as directed. Please refer to the Current Medication list given to you today.  Labwork: NONE  Testing/Procedures: Your physician has recommended that you wear an event monitor FOR 7 DAYS. Event monitors are medical devices that record the heart's electrical activity. Doctors most often us these monitors to diagnose arrhythmias. Arrhythmias are problems with the speed or rhythm of the heartbeat. The monitor is a small, portable device. You can wear one while you do your normal daily activities. This is usually used to diagnose what is causing palpitations/syncope (passing out).  Follow-Up: Your physician recommends that you schedule a follow-up appointment PENDING TEST RESULTS  Any Other Special Instructions Will Be Listed Below (If Applicable).  If you need a refill on your cardiac medications before your next appointment, please call your pharmacy. 

## 2017-01-02 NOTE — Telephone Encounter (Signed)
Patient left message stating she was scheduled for tomorrow for her MRI and they called her stating the MRI has not been authorized by [employer]. Please call patient [phone]

## 2017-01-03 ENCOUNTER — Ambulatory Visit (HOSPITAL_COMMUNITY)
Admission: RE | Admit: 2017-01-03 | Discharge: 2017-01-03 | Disposition: A | Discharge status: Home / Self Care | Payer: Commercial Managed Care - PPO | Source: Ambulatory Visit | Attending: Family Medicine | Admitting: Family Medicine

## 2017-01-03 ENCOUNTER — Ambulatory Visit (HOSPITAL_COMMUNITY): Payer: Commercial Managed Care - PPO

## 2017-01-03 DIAGNOSIS — M79604 Pain in right leg: Secondary | ICD-10-CM | POA: Insufficient documentation

## 2017-01-03 DIAGNOSIS — R202 Paresthesia of skin: Secondary | ICD-10-CM | POA: Insufficient documentation

## 2017-01-03 DIAGNOSIS — M48061 Spinal stenosis, lumbar region without neurogenic claudication: Secondary | ICD-10-CM | POA: Insufficient documentation

## 2017-01-03 DIAGNOSIS — M79609 Pain in unspecified limb: Secondary | ICD-10-CM

## 2017-01-03 MED ORDER — GADOBENATE DIMEGLUMINE 529 MG/ML IV SOLN
20.0000 mL | Freq: Once | INTRAVENOUS | Status: AC | PRN
  Administered 2017-01-03: 20 mL via INTRAVENOUS

## 2017-01-06 ENCOUNTER — Telehealth: Payer: Self-pay | Admitting: Family Medicine

## 2017-01-06 ENCOUNTER — Other Ambulatory Visit: Payer: Self-pay | Admitting: Family Medicine

## 2017-01-06 DIAGNOSIS — M5126 Other intervertebral disc displacement, lumbar region: Secondary | ICD-10-CM

## 2017-01-06 DIAGNOSIS — M5136 Other intervertebral disc degeneration, lumbar region: Secondary | ICD-10-CM

## 2017-01-06 DIAGNOSIS — F99 Mental disorder, not otherwise specified: Principal | ICD-10-CM

## 2017-01-06 DIAGNOSIS — M5441 Lumbago with sciatica, right side: Secondary | ICD-10-CM

## 2017-01-06 DIAGNOSIS — F5105 Insomnia due to other mental disorder: Secondary | ICD-10-CM

## 2017-01-06 MED ORDER — TEMAZEPAM 7.5 MG PO CAPS: 7.5000 mg | ORAL_CAPSULE | Freq: Every evening | ORAL | 0 refills | Status: DC | PRN

## 2017-01-06 NOTE — Telephone Encounter (Signed)
Patient informed of message below, verbalized understanding.  

## 2017-01-06 NOTE — Telephone Encounter (Signed)
Patient returned [name]'s call. [phone]

## 2017-01-06 NOTE — Telephone Encounter (Signed)
Called patient regarding message below. No answer, unable to leave message. Voice mail not set up 

## 2017-01-06 NOTE — Telephone Encounter (Signed)
Called patient regarding message below. No answer, unable to leave message. Patient does not have voicemail set up

## 2017-01-06 NOTE — Telephone Encounter (Signed)
  Please call patient and advise that her MRI revealed 1. At L3-4 there is a minimal broad-based disc bulge. Mild bilateral facet arthropathy. Mild left foraminal stenosis. 2. At L4-5 there is a broad-based disc bulge eccentric towards the right. Mild bilateral facet arthropathy. Right lateral recess stenosis. Moderate right foraminal stenosis. 3. At L5-S1 there is a broad-based disc bulge. Mild bilateral facet arthropathy. Moderate right foraminal stenosis. Mild left foraminal Stenosis.  These findings are consistent with the symptoms that she is having on physical exam.  Please advised that I have placed a referral to a back specialist at this time.[facility], Dr. Vertell Limber or Dr. Trenton Gammon.

## 2017-01-06 NOTE — Telephone Encounter (Signed)
Please try to call patient again. [name]. Mannie Stabile, MD

## 2017-01-08 ENCOUNTER — Telehealth: Payer: Self-pay | Admitting: Family Medicine

## 2017-01-08 NOTE — Telephone Encounter (Signed)
She has hepatic and lipid that have not yet been drawn. Should she complete those before coming back??

## 2017-01-08 NOTE — Telephone Encounter (Signed)
Patient wants to know if she needs any labs before her next followup

## 2017-01-12 ENCOUNTER — Other Ambulatory Visit: Payer: Self-pay | Admitting: Family Medicine

## 2017-01-12 DIAGNOSIS — F339 Major depressive disorder, recurrent, unspecified: Secondary | ICD-10-CM

## 2017-01-12 DIAGNOSIS — R002 Palpitations: Secondary | ICD-10-CM

## 2017-01-12 DIAGNOSIS — F419 Anxiety disorder, unspecified: Secondary | ICD-10-CM

## 2017-01-13 MED ORDER — METOPROLOL SUCCINATE ER 25 MG PO TB24: 25.0000 mg | ORAL_TABLET | Freq: Every day | ORAL | 0 refills | Status: DC

## 2017-01-13 MED ORDER — SERTRALINE HCL 100 MG PO TABS: 200.0000 mg | ORAL_TABLET | Freq: Every day | ORAL | 0 refills | Status: DC

## 2017-01-13 NOTE — Telephone Encounter (Signed)
Patient called left message wanting to know if she needs to complete lab work. Please advise [phone]

## 2017-01-14 ENCOUNTER — Ambulatory Visit: Payer: Commercial Managed Care - PPO | Admitting: Family Medicine

## 2017-01-14 LAB — LIPID PANEL
CHOL/HDL RATIO: 3.5 (calc) (ref <5.0)
CHOLESTEROL: 221 mg/dL — AB (ref <200)
HDL: 64 mg/dL (ref >50)
LDL CHOLESTEROL (CALC): 137 mg/dL — AB
NON-HDL CHOLESTEROL (CALC): 157 mg/dL — AB (ref <130)
TRIGLYCERIDES: 92 mg/dL (ref <150)

## 2017-01-14 LAB — HEPATIC FUNCTION PANEL
AG Ratio: 1.4 (calc) (ref 1.0–2.5)
ALBUMIN MSPROF: 3.8 g/dL (ref 3.6–5.1)
ALT: 15 U/L (ref 6–29)
AST: 18 U/L (ref 10–35)
Alkaline phosphatase (APISO): 77 U/L (ref 33–130)
BILIRUBIN DIRECT: 0.1 mg/dL (ref 0.0–0.2)
BILIRUBIN INDIRECT: 0.2 mg/dL (ref 0.2–1.2)
BILIRUBIN TOTAL: 0.3 mg/dL (ref 0.2–1.2)
Globulin: 2.7 g/dL (calc) (ref 1.9–3.7)
Total Protein: 6.5 g/dL (ref 6.1–8.1)

## 2017-01-14 NOTE — Telephone Encounter (Signed)
Has office visit today. Just wait and discuss at visit since would not have results back anyway. [name]. Renee Stabile, MD

## 2017-01-15 ENCOUNTER — Telehealth: Payer: Self-pay | Admitting: Family Medicine

## 2017-01-15 ENCOUNTER — Encounter: Payer: Self-pay | Admitting: Family Medicine

## 2017-01-15 NOTE — Telephone Encounter (Signed)
Please ask Selena who Dr. Meda Coffee refers patients to for back/sciatica that need evaluation due to disc issues on MRI and symptoms. I do not know of a specific provider. Gwen Her. Mannie Stabile, MD

## 2017-01-15 NOTE — Telephone Encounter (Signed)
Patient will come in for a visit.  Referral for neurosurgery is in. Do you have a recommendation for provider? Patient is waiting on referral and appointment.

## 2017-01-15 NOTE — Telephone Encounter (Signed)
Please call patient and advised that her cholesterol is basically unchanged from when we first checked it.  Advised her that her LDL is still elevated at 137 and prior it was 141.  I am glad she has been working on her diet and exercise however I would recommend cholesterol medication at this time.  Which she like to start this or discuss at her follow-up.  We discussed cholesterol medications including mechanism of action, risks versus benefits at her last appointment.  Please let me know if she wants me to call in her whether she will return to clinic to discuss.

## 2017-01-16 ENCOUNTER — Telehealth: Payer: Self-pay

## 2017-01-16 ENCOUNTER — Telehealth: Payer: Self-pay | Admitting: Family Medicine

## 2017-01-16 DIAGNOSIS — F419 Anxiety disorder, unspecified: Secondary | ICD-10-CM

## 2017-01-16 DIAGNOSIS — F339 Major depressive disorder, recurrent, unspecified: Secondary | ICD-10-CM

## 2017-01-16 MED ORDER — SERTRALINE HCL 100 MG PO TABS: 200.0000 mg | ORAL_TABLET | Freq: Every day | ORAL | 0 refills | Status: DC

## 2017-01-16 NOTE — Telephone Encounter (Signed)
Patient contacted office stating she has too much going on in her life right now to worry with the monitor and that she sent it back this morning. Patient stated it may be something she can try at a later time but right now is not good. Patient states she never opened the box. Advised patient on the importance of wearing monitor so Dr. Domenic Polite can further assess palpitations.

## 2017-01-16 NOTE — Telephone Encounter (Signed)
Patient states [name] did not receive her zoloft script. They gave her 4 pills to last her until they receive it. Cb# [phone]

## 2017-01-16 NOTE — Telephone Encounter (Signed)
Resent

## 2017-01-16 NOTE — Telephone Encounter (Signed)
Noted.  Will let Dr. Mannie Stabile know as well.  Certainly, if the patient has more palpitations and would like to investigate this further, cardiac monitor could be pursued at a future date.

## 2017-01-19 ENCOUNTER — Encounter: Payer: Self-pay | Admitting: Family Medicine

## 2017-01-21 ENCOUNTER — Ambulatory Visit: Payer: Commercial Managed Care - PPO | Admitting: Family Medicine

## 2017-01-21 NOTE — Telephone Encounter (Signed)
We do not know of any providers. Do you?

## 2017-01-21 NOTE — Telephone Encounter (Signed)
This has been done, I sent it to [facility].

## 2017-01-22 ENCOUNTER — Other Ambulatory Visit: Payer: Self-pay

## 2017-01-22 DIAGNOSIS — F339 Major depressive disorder, recurrent, unspecified: Secondary | ICD-10-CM

## 2017-01-22 DIAGNOSIS — F419 Anxiety disorder, unspecified: Secondary | ICD-10-CM

## 2017-01-22 MED ORDER — SERTRALINE HCL 100 MG PO TABS: 200.0000 mg | ORAL_TABLET | Freq: Every day | ORAL | 0 refills | Status: DC

## 2017-01-25 ENCOUNTER — Other Ambulatory Visit: Payer: Self-pay | Admitting: Family Medicine

## 2017-01-25 DIAGNOSIS — F339 Major depressive disorder, recurrent, unspecified: Secondary | ICD-10-CM

## 2017-01-25 DIAGNOSIS — F419 Anxiety disorder, unspecified: Secondary | ICD-10-CM

## 2017-01-29 ENCOUNTER — Ambulatory Visit (INDEPENDENT_AMBULATORY_CARE_PROVIDER_SITE_OTHER): Payer: Commercial Managed Care - PPO | Admitting: Family Medicine

## 2017-01-29 ENCOUNTER — Other Ambulatory Visit: Payer: Self-pay

## 2017-01-29 ENCOUNTER — Encounter: Payer: Self-pay | Admitting: Family Medicine

## 2017-01-29 VITALS — BP 148/90 | HR 100 | Temp 97.8°F | Resp 16 | Ht 62.0 in | Wt 179.8 lb

## 2017-01-29 DIAGNOSIS — I10 Essential (primary) hypertension: Secondary | ICD-10-CM | POA: Diagnosis not present

## 2017-01-29 DIAGNOSIS — F5105 Insomnia due to other mental disorder: Secondary | ICD-10-CM

## 2017-01-29 DIAGNOSIS — F339 Major depressive disorder, recurrent, unspecified: Secondary | ICD-10-CM | POA: Diagnosis not present

## 2017-01-29 DIAGNOSIS — F419 Anxiety disorder, unspecified: Secondary | ICD-10-CM | POA: Diagnosis not present

## 2017-01-29 DIAGNOSIS — M5431 Sciatica, right side: Secondary | ICD-10-CM

## 2017-01-29 DIAGNOSIS — E782 Mixed hyperlipidemia: Secondary | ICD-10-CM

## 2017-01-29 DIAGNOSIS — F99 Mental disorder, not otherwise specified: Secondary | ICD-10-CM

## 2017-01-29 DIAGNOSIS — G4700 Insomnia, unspecified: Secondary | ICD-10-CM

## 2017-01-29 MED ORDER — METOPROLOL SUCCINATE ER 50 MG PO TB24: 50.0000 mg | ORAL_TABLET | Freq: Every day | ORAL | 3 refills | Status: DC

## 2017-01-29 MED ORDER — SERTRALINE HCL 100 MG PO TABS: 200.0000 mg | ORAL_TABLET | Freq: Every day | ORAL | 3 refills | Status: DC

## 2017-01-29 MED ORDER — GABAPENTIN 100 MG PO CAPS: ORAL_CAPSULE | ORAL | 0 refills | Status: DC

## 2017-01-29 MED ORDER — TRAZODONE HCL 100 MG PO TABS: ORAL_TABLET | ORAL | 0 refills | Status: DC

## 2017-01-29 NOTE — Patient Instructions (Signed)
Fat and Cholesterol Restricted Diet Getting too much fat and cholesterol in your diet may cause health problems. Following this diet helps keep your fat and cholesterol at normal levels. This can keep you from getting sick. What types of fat should I choose?  Choose monosaturated and polyunsaturated fats. These are found in foods such as olive oil, canola oil, flaxseeds, walnuts, almonds, and seeds.  Eat more omega-3 fats. Good choices include salmon, mackerel, sardines, tuna, flaxseed oil, and ground flaxseeds.  Limit saturated fats. These are in animal products such as meats, butter, and cream. They can also be in plant products such as palm oil, palm kernel oil, and coconut oil.  Avoid foods with partially hydrogenated oils in them. These contain trans fats. Examples of foods that have trans fats are stick margarine, some tub margarines, cookies, crackers, and other baked goods. What general guidelines do I need to follow?  Check food labels. Look for the words "trans fat" and "saturated fat."  When preparing a meal: ? Fill half of your plate with vegetables and green salads. ? Fill one fourth of your plate with whole grains. Look for the word "whole" as the first word in the ingredient list. ? Fill one fourth of your plate with lean protein foods.  Eat more foods that have fiber, like apples, carrots, beans, peas, and barley.  Eat more home-cooked foods. Eat less at restaurants and buffets.  Limit or avoid alcohol.  Limit foods high in starch and sugar.  Limit fried foods.  Cook foods without frying them. Baking, boiling, grilling, and broiling are all great options.  Lose weight if you are overweight. Losing even a small amount of weight can help your overall health. It can also help prevent diseases such as diabetes and heart disease. What foods can I eat? Grains Whole grains, such as whole wheat or whole grain breads, crackers, cereals, and pasta. Unsweetened oatmeal,  bulgur, barley, quinoa, or brown rice. Corn or whole wheat flour tortillas. Vegetables Fresh or frozen vegetables (raw, steamed, roasted, or grilled). Green salads. Fruits All fresh, canned (in natural juice), or frozen fruits. Meat and Other Protein Products Ground beef (85% or leaner), grass-fed beef, or beef trimmed of fat. Skinless chicken or turkey. Ground chicken or turkey. Pork trimmed of fat. All fish and seafood. Eggs. Dried beans, peas, or lentils. Unsalted nuts or seeds. Unsalted canned or dry beans. Dairy Low-fat dairy products, such as skim or 1% milk, 2% or reduced-fat cheeses, low-fat ricotta or cottage cheese, or plain low-fat yogurt. Fats and Oils Tub margarines without trans fats. Light or reduced-fat mayonnaise and salad dressings. Avocado. Olive, canola, sesame, or safflower oils. Natural peanut or almond butter (choose ones without added sugar and oil). The items listed above may not be a complete list of recommended foods or beverages. Contact your dietitian for more options. What foods are not recommended? Grains White bread. White pasta. White rice. Cornbread. Bagels, pastries, and croissants. Crackers that contain trans fat. Vegetables White potatoes. Corn. Creamed or fried vegetables. Vegetables in a cheese sauce. Fruits Dried fruits. Canned fruit in light or heavy syrup. Fruit juice. Meat and Other Protein Products Fatty cuts of meat. Ribs, chicken wings, bacon, sausage, bologna, salami, chitterlings, fatback, hot dogs, bratwurst, and packaged luncheon meats. Liver and organ meats. Dairy Whole or 2% milk, cream, half-and-half, and cream cheese. Whole milk cheeses. Whole-fat or sweetened yogurt. Full-fat cheeses. Nondairy creamers and whipped toppings. Processed cheese, cheese spreads, or cheese curds. Sweets and Desserts Corn   syrup, sugars, honey, and molasses. Candy. Jam and jelly. Syrup. Sweetened cereals. Cookies, pies, cakes, donuts, muffins, and ice  cream. Fats and Oils Butter, stick margarine, lard, shortening, ghee, or bacon fat. Coconut, palm kernel, or palm oils. Beverages Alcohol. Sweetened drinks (such as sodas, lemonade, and fruit drinks or punches). The items listed above may not be a complete list of foods and beverages to avoid. Contact your dietitian for more information. This information is not intended to replace advice given to you by your health care provider. Make sure you discuss any questions you have with your health care provider. Document Released: 08/06/2011 Document Revised: 10/12/2015 Document Reviewed: 05/06/2013 Elsevier Interactive Patient Education  2018 [employer]. Cholesterol Cholesterol is a white, waxy, fat-like substance that is needed by the human body in small amounts. The liver makes all the cholesterol we need. Cholesterol is carried from the liver by the blood through the blood vessels. Deposits of cholesterol (plaques) may build up on blood vessel (artery) walls. Plaques make the arteries narrower and stiffer. Cholesterol plaques increase the risk for heart attack and stroke. You cannot feel your cholesterol level even if it is very high. The only way to know that it is high is to have a blood test. Once you know your cholesterol levels, you should keep a record of the test results. Work with your health care provider to keep your levels in the desired range. What do the results mean?  Total cholesterol is a rough measure of all the cholesterol in your blood.  LDL (low-density lipoprotein) is the "bad" cholesterol. This is the type that causes plaque to build up on the artery walls. You want this level to be low.  HDL (high-density lipoprotein) is the "good" cholesterol because it cleans the arteries and carries the LDL away. You want this level to be high.  Triglycerides are fat that the body can either burn for energy or store. High levels are closely linked to heart disease. What are the desired  levels of cholesterol?  Total cholesterol below 200.  LDL below 100 for people who are at risk, below 70 for people at very high risk.  HDL above 40 is good. A level of 60 or higher is considered to be protective against heart disease.  Triglycerides below 150. How can I lower my cholesterol? Diet Follow your diet program as told by your health care provider.  Choose fish or white meat chicken and [place], roasted or baked. Limit fatty cuts of red meat, fried foods, and processed meats, such as sausage and lunch meats.  Eat lots of fresh fruits and vegetables.  Choose whole grains, beans, pasta, potatoes, and cereals.  Choose olive oil, corn oil, or canola oil, and use only small amounts.  Avoid butter, mayonnaise, shortening, or palm kernel oils.  Avoid foods with trans fats.  Drink skim or nonfat milk and eat low-fat or nonfat yogurt and cheeses. Avoid whole milk, cream, ice cream, egg yolks, and full-fat cheeses.  Healthier desserts include angel food cake, ginger snaps, animal crackers, hard candy, popsicles, and low-fat or nonfat frozen yogurt. Avoid pastries, cakes, pies, and cookies.  Exercise  Follow your exercise program as told by your health care provider. A regular program: ? Helps to decrease LDL and raise HDL. ? Helps with weight control.  Do things that increase your activity level, such as gardening, walking, and taking the stairs.  Ask your health care provider about ways that you can be more active in your  daily life.  Medicine  Take over-the-counter and prescription medicines only as told by your health care provider. ? Medicine may be prescribed by your health care provider to help lower cholesterol and decrease the risk for heart disease. This is usually done if diet and exercise have failed to bring down cholesterol levels. ? If you have several risk factors, you may need medicine even if your levels are normal.  This information is not intended to  replace advice given to you by your health care provider. Make sure you discuss any questions you have with your health care provider. Document Released: 10/30/2000 Document Revised: 09/02/2015 Document Reviewed: 08/05/2015 Elsevier Interactive Patient Education  2017 [email or web address] American [employer] Association [email or web address]    Diet to lower your cholesterol.

## 2017-01-29 NOTE — Progress Notes (Signed)
Patient ID: [name], female    DOB: Dec 26, 1955, 61 y.o.   MRN: [id number]  Chief Complaint  Patient presents with  . Hyperlipidemia  . Follow-up    Allergies Patient has no known allergies.  Subjective:   [name] is a 61 y.o. female who presents to [facility] today.  HPI Here to discuss cholesterol. Came in to discuss cholesterol. Does not want to be on statin medication. Has a history of HLD and HTN. Reports that does not eat healthy.  Reports that she eats ice cream, large cup of it each day.  Reports she just eats basically whatever she wants.  Has never thought that she had a problem with her cholesterol.  Does not want to be on a statin medication.  Reports she is motivated to make some changes in her diet.  Was seen by cardiology within the past several months due to palpitations.  Was started on a beta-blocker.  Reports her blood pressure has been a bit elevated at home and when she was at the cardiologist office.  Review of visit does show elevated blood pressure.  She reports that just dealing with the stress of taking care of her mother and life stressors that she does feel like her heart beats faster when she gets anxious.  She denies any chest pain, shortness of breath or swelling in her extremities.  She reports that she does have an upcoming appointment with the neurosurgeon.  Reports that the pain in her right side going down to her knee and foot is slightly better with the Neurontin.  Reports that with the Neurontin the burning and tingling pain is better.  She has been using ice to the area and has also been doing stretching exercises.  She is interested in increasing the Neurontin.  She denies any side effects with the medication.  She is now urinating well.  Denies any weakness in her upper or lower extremities.  Hyperlipidemia  This is a new problem. This is a new diagnosis. The problem is uncontrolled. Recent lipid tests were reviewed and are  high. Exacerbating diseases include obesity. She has no history of chronic renal disease, diabetes, hypothyroidism, liver disease or nephrotic syndrome. Factors aggravating her hyperlipidemia include beta blockers and fatty foods. Pertinent negatives include no chest pain, focal sensory loss, focal weakness, myalgias or shortness of breath. She is currently on no antihyperlipidemic treatment. Compliance problems include adherence to diet and adherence to exercise.  Risk factors for coronary artery disease include dyslipidemia, family history, obesity, hypertension, a sedentary lifestyle, stress and post-menopausal.    Past Medical History:  Diagnosis Date  . Anxiety   . Depression   . GERD (gastroesophageal reflux disease)   . Hypertension     Past Surgical History:  Procedure Laterality Date  . KNEE CARTILAGE SURGERY Right    Workers Compensation, fell out of chair at work.     Family History  Problem Relation Age of Onset  . Alzheimer's disease Mother   . Hypertension Sister   . Diabetes Maternal Uncle   . Hypertension Sister      Social History   Socioeconomic History  . Marital status: Married    Spouse name: None  . Number of children: None  . Years of education: None  . Highest education level: None  Social Needs  . Financial resource strain: None  . Food insecurity - worry: None  . Food insecurity - inability: None  . Transportation needs - medical:  None  . Transportation needs - non-medical: None  Occupational History  . None  Tobacco Use  . Smoking status: Former Research scientist (life sciences)  . Smokeless tobacco: Never Used  Substance and Sexual Activity  . Alcohol use: No  . Drug use: No  . Sexual activity: Yes    Birth control/protection: Post-menopausal  Other Topics Concern  . None  Social History Narrative   Married. 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 Spends every 3rd night at [facility]. Has been taking care of mother since 1.    Current Outpatient  Medications on File Prior to Visit  Medication Sig Dispense Refill  . aspirin EC 81 MG tablet Take 81 mg by mouth daily.    . Cholecalciferol (D3-1000) 1000 units capsule Take 4,000 Units by mouth daily.    . hydrOXYzine (ATARAX/VISTARIL) 25 MG tablet Take 25 mg by mouth 3 (three) times daily as needed.    . Multiple Vitamin (MULTIVITAMIN) capsule Take 1 capsule by mouth daily.    . temazepam (RESTORIL) 7.5 MG capsule Take 1 capsule (7.5 mg total) at bedtime as needed by mouth for sleep. 30 capsule 0   No current facility-administered medications on file prior to visit.     Review of Systems  Constitutional: Negative for appetite change, chills, diaphoresis and fatigue.  Eyes: Negative for visual disturbance.  Respiratory: Negative for cough, choking, chest tightness and shortness of breath.   Cardiovascular: Negative for chest pain, palpitations and leg swelling.  Gastrointestinal: Negative for abdominal pain and blood in stool.  Endocrine: Negative for polyuria.  Musculoskeletal: Negative for myalgias.  Neurological: Negative for focal weakness, syncope, facial asymmetry, light-headedness and numbness.  Hematological: Negative for adenopathy. Does not bruise/bleed easily.  Psychiatric/Behavioral: Negative for behavioral problems, confusion, decreased concentration and dysphoric mood.     Objective:   BP 140/90 (BP Location: Left Arm, Patient Position: Sitting, Cuff Size: Normal)   Pulse 100   Temp 97.8 F (36.6 C) (Other (Comment))   Resp 16   Ht 5\' 2"  (1.575 m)   Wt 179 lb 12 oz (81.5 kg)   SpO2 97%   BMI 32.88 kg/m   Physical Exam  Constitutional: She is oriented to person, place, and time. She appears well-developed and well-nourished.  HENT:  Head: Normocephalic and atraumatic.  Eyes: EOM are normal. Pupils are equal, round, and reactive to light. No scleral icterus.  Neck: Normal range of motion. Neck supple. No tracheal deviation present. No thyromegaly present.    Cardiovascular: Normal rate, regular rhythm and normal heart sounds.  No murmur heard. Pulmonary/Chest: Effort normal and breath sounds normal. No respiratory distress.  Musculoskeletal: Normal range of motion.  Lymphadenopathy:    She has no cervical adenopathy.  Neurological: She is alert and oriented to person, place, and time.  Psychiatric: She has a normal mood and affect. Her behavior is normal. Judgment and thought content normal.  Vitals reviewed.    Assessment and Plan  1. Sciatica of right side Patient has upcoming appointment with neurosurgery.  Will increase gabapentin at this time due to her symptoms. Patient counseled in detail regarding the risks of medication. Told to call or return to clinic if develop any worrisome signs or symptoms. Patient voiced understanding.   - gabapentin (NEURONTIN) 100 MG capsule; Take two pills twice a day as directed.  Dispense: 180 capsule; Refill: 0  2. HTN, goal below 140/90 Increase metoprolol as directed.  Recheck in 1 month. - metoprolol succinate (TOPROL-XL) 50 MG 24 hr  tablet; Take 1 tablet (50 mg total) by mouth daily. Take with or immediately following a meal.  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  Will allow patient to take this medication.  We did discuss possible side effects secondary to the fact that she is already on Zoloft.  She has agreed not to take any more than her prescribed dose.  She was told to call with any questions or concerns.  She denies any suicidal or homicidal ideations.  She was told if there are any abrupt changes in her mood to please call or return to clinic. - traZODone (DESYREL) 100 MG tablet; Take two pills each night as directed.  Dispense: 180 tablet; Refill: 0  4. Depression, recurrent ([place]) Continue medication.  Patient defers any therapy or counseling at this time.  Suicide precautions given again at this visit. - sertraline  (ZOLOFT) 100 MG tablet; Take 2 tablets (200 mg total) by mouth daily.  Dispense: 60 tablet; Refill: 3  7. Mixed hyperlipidemia The patient is asked to make an attempt to improve diet and exercise patterns to aid in medical management of this problem. Hyperlipidemia and the associated risk of ASCVD were discussed today. Primary vs. Secondary prevention of ASCVD were discussed and how it relates to patient morbidity, mortality, and quality of life.  We discussed heart healthy diet, lifestyle modifications, risk factor modifications, and adherence to the recommended treatment plan. We discussed the need to periodically monitor lipid panel and liver function tests while on statin therapy.  Handouts given regarding cholesterol management, dietary recommendations.  Patient reports she does use the Internet frequently.  She was referred to the American Heart Association website and the American Academy of family physicians website to do more reading on ways to lower her cholesterol.  She defers dietitian/nutritionist.  We will plan to recheck her cholesterol in 3 months.  Return in about 4 weeks (around 02/26/2017) for BP. Caren Macadam, MD 01/29/2017

## 2017-02-07 ENCOUNTER — Other Ambulatory Visit: Payer: Self-pay | Admitting: Family Medicine

## 2017-02-07 DIAGNOSIS — F5105 Insomnia due to other mental disorder: Secondary | ICD-10-CM

## 2017-02-07 DIAGNOSIS — F99 Mental disorder, not otherwise specified: Principal | ICD-10-CM

## 2017-02-11 ENCOUNTER — Other Ambulatory Visit: Payer: Self-pay | Admitting: Family Medicine

## 2017-02-11 DIAGNOSIS — F5105 Insomnia due to other mental disorder: Secondary | ICD-10-CM

## 2017-02-11 DIAGNOSIS — F99 Mental disorder, not otherwise specified: Principal | ICD-10-CM

## 2017-02-13 ENCOUNTER — Telehealth: Payer: Self-pay | Admitting: Family Medicine

## 2017-02-13 ENCOUNTER — Encounter: Payer: Self-pay | Admitting: Family Medicine

## 2017-02-13 NOTE — Telephone Encounter (Signed)
Pt needs a refill for tramazapan

## 2017-02-13 NOTE — Telephone Encounter (Signed)
This has been done.

## 2017-02-17 ENCOUNTER — Telehealth: Payer: Self-pay | Admitting: Family Medicine

## 2017-02-17 NOTE — Telephone Encounter (Signed)
Pt is calling in regarding her Amazpin--

## 2017-02-17 NOTE — Telephone Encounter (Signed)
Called patient regarding message below. No answer, left generic message for patient to return call.   

## 2017-02-20 NOTE — Telephone Encounter (Signed)
Called patient regarding message below. No answer, left generic message for patient to return call.   

## 2017-03-05 ENCOUNTER — Ambulatory Visit: Payer: Commercial Managed Care - PPO | Admitting: Family Medicine

## 2017-03-13 ENCOUNTER — Encounter: Payer: Self-pay | Admitting: Family Medicine

## 2017-03-13 ENCOUNTER — Ambulatory Visit (INDEPENDENT_AMBULATORY_CARE_PROVIDER_SITE_OTHER): Payer: Commercial Managed Care - PPO | Admitting: Family Medicine

## 2017-03-13 ENCOUNTER — Other Ambulatory Visit: Payer: Self-pay

## 2017-03-13 VITALS — BP 126/74 | HR 66 | Temp 97.4°F | Resp 16 | Ht 62.0 in | Wt 182.2 lb

## 2017-03-13 DIAGNOSIS — M792 Neuralgia and neuritis, unspecified: Secondary | ICD-10-CM

## 2017-03-13 DIAGNOSIS — I10 Essential (primary) hypertension: Secondary | ICD-10-CM | POA: Diagnosis not present

## 2017-03-13 DIAGNOSIS — G5791 Unspecified mononeuropathy of right lower limb: Secondary | ICD-10-CM | POA: Diagnosis not present

## 2017-03-13 DIAGNOSIS — Z23 Encounter for immunization: Secondary | ICD-10-CM

## 2017-03-13 DIAGNOSIS — Z1239 Encounter for other screening for malignant neoplasm of breast: Secondary | ICD-10-CM

## 2017-03-13 DIAGNOSIS — Z1231 Encounter for screening mammogram for malignant neoplasm of breast: Secondary | ICD-10-CM

## 2017-03-13 MED ORDER — METHYLPREDNISOLONE ACETATE 80 MG/ML IJ SUSP
80.0000 mg | Freq: Once | INTRAMUSCULAR | Status: AC
  Administered 2017-03-13: 80 mg via INTRAMUSCULAR

## 2017-03-13 MED ORDER — GABAPENTIN 300 MG PO CAPS: 300.0000 mg | ORAL_CAPSULE | Freq: Three times a day (TID) | ORAL | 1 refills | Status: DC

## 2017-03-13 MED ORDER — DICLOFENAC SODIUM 75 MG PO TBEC
75.0000 mg | DELAYED_RELEASE_TABLET | Freq: Two times a day (BID) | ORAL | 0 refills | Status: DC
Start: 2017-03-13 — End: 2017-04-03

## 2017-03-13 NOTE — Progress Notes (Signed)
Patient ID: [name], female    DOB: 1955-11-16, 62 y.o.   MRN: [id number]  Chief Complaint  Patient presents with  . Follow-up    Allergies Patient has no known allergies.  Subjective:   [name] is a 62 y.o. female who presents to [facility] today.  HPI Here for follow up. Has been doing a bit better. Brings in a note from her PT in [place]. Has been seen by neurosurgery on 01/30/2017 in [place], [place]. Is not sure of what her actual diagnosis was from him. He gave her the options surgery and  spinal injection. Went for PT after the visit. Believes that PT is helping but it has not alleviated the pain. Still has pain in the right thigh region. Pain is burning, numb, and tingling in character. Has been on neurontin with some help. Pain is never really gone away. The pain radiates down thigh and into the foot. Taking the neurontin twice a day. Was given steroids by a previous doctor and it did help a little. Was also given the steroid pills and they made her feel anxious and have an elevated heart rate.  This bother her on a daily basis throughout the day. No weakness in leg. Just chronic pain that bothers her. When walks the pain is worse at times. When bends right leg at knee can feel some pain in thigh. PT is helping to stretch and with exercise. Gets some pain in the buttock region. Has never been on NSAID for pain and would like to give it a try.  Reports that he talked with her physical therapist about initiating steroid injection and he had suggested it.  She brings a note from physical therapy suggesting patient's symptoms could be complicated by meralgia paresthetica.  She is also here for blood pressure check.  Reports that her blood pressures been running well.  Compliance with medication on a daily basis.  Denies any chest pain, shortness of breath, or swelling in extremities.  Denies any side effects with medication.  Reports that her mood is good taking her  Zoloft.  Has cut down theTrazodone to 1 at bedtime.  Still using the temazepam for sleep.  Denies any depression symptoms at this time.  Still very busy taking care of her mother.    Past Medical History:  Diagnosis Date  . Anxiety   . Depression   . GERD (gastroesophageal reflux disease)   . Hypertension     Past Surgical History:  Procedure Laterality Date  . KNEE CARTILAGE SURGERY Right    Workers Compensation, fell out of chair at work.     Family History  Problem Relation Age of Onset  . Alzheimer's disease Mother   . Hypertension Sister   . Diabetes Maternal Uncle   . Hypertension Sister      Social History   Socioeconomic History  . Marital status: Married    Spouse name: None  . Number of children: None  . Years of education: None  . Highest education level: None  Social Needs  . Financial resource strain: None  . Food insecurity - worry: None  . Food insecurity - inability: None  . Transportation needs - medical: None  . Transportation needs - non-medical: None  Occupational History  . None  Tobacco Use  . Smoking status: Former Research scientist (life sciences)  . Smokeless tobacco: Never Used  Substance and Sexual Activity  . Alcohol use: No  . Drug use: No  . Sexual activity:  Yes    Birth control/protection: Post-menopausal  Other Topics Concern  . None  Social History Narrative   Married. 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 Spends every 3rd night at [facility]. Has been taking care of mother since 63.    Current Outpatient Medications on File Prior to Visit  Medication Sig Dispense Refill  . aspirin EC 81 MG tablet Take 81 mg by mouth daily.    . Cholecalciferol (D3-1000) 1000 units capsule Take 4,000 Units by mouth daily.    . hydrOXYzine (ATARAX/VISTARIL) 25 MG tablet Take 25 mg by mouth 3 (three) times daily as needed.    . metoprolol succinate (TOPROL-XL) 50 MG 24 hr tablet Take 1 tablet (50 mg total) by mouth daily. Take with or immediately  following a meal. 90 tablet 3  . Multiple Vitamin (MULTIVITAMIN) capsule Take 1 capsule by mouth daily.    . sertraline (ZOLOFT) 100 MG tablet Take 2 tablets (200 mg total) by mouth daily. 60 tablet 3  . temazepam (RESTORIL) 7.5 MG capsule TAKE ONE CAPSULE BY MOUTH AT BEDTIME AS NEEDED FOR SLEEP 30 capsule 2  . traZODone (DESYREL) 100 MG tablet Take two pills each night as directed. 180 tablet 0   No current facility-administered medications on file prior to visit.     Review of Systems  Constitutional: Negative for appetite change and unexpected weight change.  Cardiovascular: Negative for chest pain, palpitations and leg swelling.  Musculoskeletal: Negative for arthralgias, gait problem, joint swelling and myalgias.  Skin: Negative for rash.  Neurological: Positive for numbness.       Numbness, paresthesia, hyperesthesia is in right lateral thigh region, radiating down to her foot and toes.  Psychiatric/Behavioral: Negative for behavioral problems and dysphoric mood. The patient is not nervous/anxious.      Objective:   BP 126/74 (BP Location: Left Arm, Patient Position: Sitting, Cuff Size: Normal)   Pulse 66   Temp (!) 97.4 F (36.3 C) (Temporal)   Resp 16   Ht 5\' 2"  (1.575 m)   Wt 182 lb 4 oz (82.7 kg)   SpO2 97%   BMI 33.33 kg/m  BP 126/74 Physical Exam  Constitutional: She is oriented to person, place, and time. She appears well-developed and well-nourished.  HENT:  Head: Normocephalic and atraumatic.  Eyes: EOM are normal. Pupils are equal, round, and reactive to light.  Cardiovascular: Normal rate and regular rhythm.  Pulmonary/Chest: Effort normal and breath sounds normal.  Musculoskeletal: Normal range of motion.  Neurological: She is alert and oriented to person, place, and time. She displays normal reflexes. A sensory deficit is present. No cranial nerve deficit. She exhibits normal muscle tone. Coordination normal.  Patient with sensory changes on left lateral  thigh region.  Hyperesthesia present.  Skin: Skin is warm and dry.  Vitals reviewed.    Assessment and Plan  1. Neuropathic pain of thigh, right Request records from [facility]. -Patient counseled in detail regarding the risks of medication. Told to call or return to clinic if develop any worrisome signs or symptoms. Patient voiced understanding.  Sedation precautions of medication given and discussed with patient.  She agrees titrating her medication she will not drive or operate heavy machinery. - gabapentin (NEURONTIN) 300 MG capsule; Take 1 capsule (300 mg total) by mouth 3 (three) times daily.  Dispense: 90 capsule; Refill: 1 - diclofenac (VOLTAREN) 75 MG EC tablet; Take 1 tablet (75 mg total) by mouth 2 (two) times daily.  Dispense:  28 tablet; Refill: 0 - methylPREDNISolone acetate (DEPO-MEDROL) injection 80 mg Discussed risks of cardiovascular thrombotic events related to NSAIDS. Discussed increased risk of AMI and CVA. Discussed risk of serious GI adverse events including bleeding, ulcers, and perforation. Patient understands risks of this medication.   2. HTN, goal below 140/90 Lifestyle modifications discussed with patient including a diet emphasizing vegetables, fruits, and whole grains. Limiting intake of sodium to less than 2,400 mg per day.  Recommendations discussed include consuming low-fat dairy products, poultry, fish, legumes, non-tropical vegetable oils, and nuts; and limiting intake of sweets, sugar-sweetened beverages, and red meat. Discussed following a plan such as the Dietary Approaches to Stop Hypertension (DASH) diet. Patient to read up on this diet.  The patient is asked to make an attempt to improve diet and exercise patterns to aid in medical management of this problem.  Stable. Continue medication. Weight loss disucssed.   3. Screening for breast cancer Ordered.  - MM Digital Screening; Future  4. Immunization due - Tdap vaccine greater  than or equal to 7yo IM  Return in about 3 weeks (around 04/03/2017) for follow up. Caren Macadam, MD 03/13/2017

## 2017-04-03 ENCOUNTER — Encounter: Payer: Self-pay | Admitting: Family Medicine

## 2017-04-03 ENCOUNTER — Ambulatory Visit (INDEPENDENT_AMBULATORY_CARE_PROVIDER_SITE_OTHER): Payer: Commercial Managed Care - PPO | Admitting: Family Medicine

## 2017-04-03 ENCOUNTER — Other Ambulatory Visit: Payer: Self-pay

## 2017-04-03 ENCOUNTER — Other Ambulatory Visit: Payer: Self-pay | Admitting: Family Medicine

## 2017-04-03 VITALS — BP 156/98 | HR 74 | Temp 97.5°F | Resp 16 | Ht 62.0 in | Wt 182.8 lb

## 2017-04-03 DIAGNOSIS — Z1239 Encounter for other screening for malignant neoplasm of breast: Secondary | ICD-10-CM

## 2017-04-03 DIAGNOSIS — I10 Essential (primary) hypertension: Secondary | ICD-10-CM

## 2017-04-03 DIAGNOSIS — R202 Paresthesia of skin: Secondary | ICD-10-CM | POA: Diagnosis not present

## 2017-04-03 DIAGNOSIS — Z1231 Encounter for screening mammogram for malignant neoplasm of breast: Secondary | ICD-10-CM | POA: Diagnosis not present

## 2017-04-03 DIAGNOSIS — G5791 Unspecified mononeuropathy of right lower limb: Secondary | ICD-10-CM

## 2017-04-03 DIAGNOSIS — M792 Neuralgia and neuritis, unspecified: Secondary | ICD-10-CM

## 2017-04-03 MED ORDER — GABAPENTIN 300 MG PO CAPS: 600.0000 mg | ORAL_CAPSULE | Freq: Three times a day (TID) | ORAL | 1 refills | Status: DC

## 2017-04-03 NOTE — Progress Notes (Signed)
Patient ID: [name], female    DOB: July 26, 1955, 62 y.o.   MRN: [id number]  Chief Complaint  Patient presents with  . Follow-up    Allergies Patient has no known allergies.  Subjective:   [name] is a 62 y.o. female who presents to [facility] today.  HPI [name] is here today for follow-up visit to discuss the pain in her right upper thigh.  She reports that she has been taking the Neurontin as directed.  She reports that the pain is a little bit better but it still bothers her on a daily basis.  She reports the pain is always constant but it is decreased in severity.  She reports the pain is still sharp and burning in quality and feels like electrical impulses in her leg at times.  She denies any weakness.  She reports that it is more numbness and tingling in sensation.  She is urinating fine.  She can move her legs within normal limits.  She does report that she is taken her blood pressure medication today.  She reports she has been somewhat stressed and taking care of her mother and dealing with life.  She does report compliance with her medication.  She reports her energy level is pretty good.  Her sleep is fair.  She denies any depression or anxiety symptoms.    Past Medical History:  Diagnosis Date  . Anxiety   . Depression   . GERD (gastroesophageal reflux disease)   . Hypertension     Past Surgical History:  Procedure Laterality Date  . KNEE CARTILAGE SURGERY Right    Workers Compensation, fell out of chair at work.     Family History  Problem Relation Age of Onset  . Alzheimer's disease Mother   . Hypertension Sister   . Diabetes Maternal Uncle   . Hypertension Sister      Social History   Socioeconomic History  . Marital status: Married    Spouse name: None  . Number of children: None  . Years of education: None  . Highest education level: None  Social Needs  . Financial resource strain: None  . Food insecurity - worry: None  .  Food insecurity - inability: None  . Transportation needs - medical: None  . Transportation needs - non-medical: None  Occupational History  . None  Tobacco Use  . Smoking status: Former Research scientist (life sciences)  . Smokeless tobacco: Never Used  Substance and Sexual Activity  . Alcohol use: No  . Drug use: No  . Sexual activity: Yes    Birth control/protection: Post-menopausal  Other Topics Concern  . None  Social History Narrative   Married. 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 Spends every 3rd night at [facility]. Has been taking care of mother since 52.     Review of Systems  Constitutional: Negative for activity change, appetite change and fever.  Eyes: Negative for visual disturbance.  Respiratory: Negative for cough, chest tightness and shortness of breath.   Cardiovascular: Negative for chest pain, palpitations and leg swelling.  Gastrointestinal: Negative for abdominal pain, nausea and vomiting.  Genitourinary: Negative for dysuria, frequency and urgency.  Musculoskeletal: Negative for arthralgias and myalgias.  Skin: Negative for rash.  Neurological: Positive for numbness. Negative for dizziness, syncope and light-headedness.  Hematological: Negative for adenopathy.  Psychiatric/Behavioral: Negative for behavioral problems, decreased concentration, dysphoric mood and suicidal ideas.     Objective:   BP (!) 156/98 (BP Location: Left Arm,  Patient Position: Sitting, Cuff Size: Normal)   Pulse 74   Temp (!) 97.5 F (36.4 C) (Temporal)   Resp 16   Ht 5\' 2"  (1.575 m)   Wt 182 lb 12 oz (82.9 kg)   SpO2 98%   BMI 33.43 kg/m   Physical Exam  Constitutional: She is oriented to person, place, and time. She appears well-developed and well-nourished.  HENT:  Head: Normocephalic and atraumatic.  Eyes: Conjunctivae and EOM are normal. Pupils are equal, round, and reactive to light.  Neck: Normal range of motion. Neck supple.  Cardiovascular: Normal rate, regular rhythm  and normal heart sounds.  Pulmonary/Chest: Effort normal and breath sounds normal. No respiratory distress.  Neurological: She is alert and oriented to person, place, and time.  Skin: Skin is warm and dry. Capillary refill takes less than 2 seconds.  Psychiatric: She has a normal mood and affect. Her behavior is normal. Judgment and thought content normal.  Vitals reviewed.    Assessment and Plan   1. Screening for breast cancer Referral placed.  Clinical breast exam at follow-up. - MM Digital Screening; Future  2. Right leg paresthesias/Neuropathic pain of thigh, right Patient has had minimal decrease in her pain with the dose of Neurontin.  However, she has had some improvement.  At this time will increase the medication to 2 pills, 3 times a day.  She will titrate up her dose over the next several weeks as directed.  She was counseled concerning possible side effects with this medication.  Will refer patient to neurology at this time for evaluation. Will again request records from [facility]. Patient is not interested in surgery, interventional pain management, narcotic medications.  However, she would like this pain to be resolved.  Referral was placed for secondary evaluation and workup. - gabapentin (NEURONTIN) 300 MG capsule; Take 2 capsules (600 mg total) by mouth 3 (three) times daily.  Dispense: 180 capsule; Refill: 1  3. Hypertension Patient's blood pressure is usually well controlled on her medications.  However, her blood pressure is more elevated today than usual.  She will check her blood pressure at home for the next  several days and if elevated she will call me back.  In addition she will come by the office and let me check her blood pressure cuff to make sure it is recording correctly.  She will decrease her salt intake as directed.  We will decide about needing to add another medication for blood pressure control pending her readings.  Office visit  today was 25 minutes.  Greater than 50% of office visit was spent counseling and coordinating care. Return in about 4 weeks (around 05/01/2017) for BP/nerve pain. Caren Macadam, MD 04/04/2017

## 2017-04-04 ENCOUNTER — Encounter: Payer: Self-pay | Admitting: Family Medicine

## 2017-04-05 ENCOUNTER — Encounter: Payer: Self-pay | Admitting: Family Medicine

## 2017-04-07 ENCOUNTER — Telehealth: Payer: Self-pay | Admitting: Family Medicine

## 2017-04-07 NOTE — Telephone Encounter (Signed)
BP Readings---for Dr Mannie Stabile  Feb 15 10:35 am 154/98              12:24 pm 149/86  Feb 16th  1:02 am 152/99                  11:40am 166/100                   2:20pm     154/92                   2:23pm    139/86  Feb 17th  11:48am   168/100

## 2017-04-08 ENCOUNTER — Telehealth: Payer: Self-pay | Admitting: Family Medicine

## 2017-04-08 DIAGNOSIS — I10 Essential (primary) hypertension: Secondary | ICD-10-CM

## 2017-04-08 MED ORDER — HYDROCHLOROTHIAZIDE 25 MG PO TABS: 25.0000 mg | ORAL_TABLET | Freq: Every day | ORAL | 0 refills | Status: DC

## 2017-04-08 NOTE — Telephone Encounter (Signed)
Patient wanted to call in to see what Dr.Hagler thought of her BP readings that she has been sending through message as well as calling with readings.   Not sure if Dr.Hagler wants to adjust medication, please call patient to discuss Cb#: [phone]

## 2017-04-08 NOTE — Telephone Encounter (Signed)
Patient informed of message below, verbalized understanding.  

## 2017-04-08 NOTE — Telephone Encounter (Signed)
Please call patient and advise at this time will start HCTZ 25 mg, 1 p.o. daily.  Take first thing in the morning.  We will need to follow-up in 2-3 weeks for blood pressure check and potassium check at that time.  She may continue to check her blood pressures but she does not need to do so 3 times a day.  Prescription sent into the pharmacy.

## 2017-04-09 ENCOUNTER — Telehealth: Payer: Self-pay | Admitting: Family Medicine

## 2017-04-09 NOTE — Telephone Encounter (Signed)
I spoke with patient yesterday and informed her of medication changes. This message was left prior to me speaking with her.

## 2017-04-09 NOTE — Telephone Encounter (Signed)
[name] on 04-08-17 calling to follow up on the BP readings that she left, and wanted to know if Dr Mannie Stabile was going to make a change to her medicine

## 2017-04-17 ENCOUNTER — Ambulatory Visit (HOSPITAL_COMMUNITY)
Admission: RE | Admit: 2017-04-17 | Discharge: 2017-04-17 | Disposition: A | Discharge status: Home / Self Care | Payer: Commercial Managed Care - PPO | Source: Ambulatory Visit | Attending: Family Medicine | Admitting: Family Medicine

## 2017-04-17 DIAGNOSIS — Z1231 Encounter for screening mammogram for malignant neoplasm of breast: Secondary | ICD-10-CM | POA: Insufficient documentation

## 2017-05-01 ENCOUNTER — Other Ambulatory Visit: Payer: Self-pay | Admitting: Family Medicine

## 2017-05-01 DIAGNOSIS — I10 Essential (primary) hypertension: Secondary | ICD-10-CM

## 2017-05-01 DIAGNOSIS — F5105 Insomnia due to other mental disorder: Secondary | ICD-10-CM

## 2017-05-01 DIAGNOSIS — F99 Mental disorder, not otherwise specified: Secondary | ICD-10-CM

## 2017-05-10 ENCOUNTER — Other Ambulatory Visit: Payer: Self-pay | Admitting: Family Medicine

## 2017-05-10 DIAGNOSIS — F5105 Insomnia due to other mental disorder: Secondary | ICD-10-CM

## 2017-05-10 DIAGNOSIS — F99 Mental disorder, not otherwise specified: Principal | ICD-10-CM

## 2017-05-14 ENCOUNTER — Other Ambulatory Visit: Payer: Self-pay | Admitting: Family Medicine

## 2017-05-14 ENCOUNTER — Other Ambulatory Visit: Payer: Self-pay

## 2017-05-14 ENCOUNTER — Ambulatory Visit (INDEPENDENT_AMBULATORY_CARE_PROVIDER_SITE_OTHER): Payer: Commercial Managed Care - PPO | Admitting: Family Medicine

## 2017-05-14 ENCOUNTER — Other Ambulatory Visit (HOSPITAL_COMMUNITY)
Admission: RE | Admit: 2017-05-14 | Discharge: 2017-05-14 | Disposition: A | Discharge status: Home / Self Care | Payer: Commercial Managed Care - PPO | Source: Ambulatory Visit | Attending: Family Medicine | Admitting: Family Medicine

## 2017-05-14 ENCOUNTER — Encounter: Payer: Self-pay | Admitting: Family Medicine

## 2017-05-14 VITALS — BP 130/71 | HR 84 | Temp 98.0°F | Resp 16 | Ht 62.0 in | Wt 184.0 lb

## 2017-05-14 DIAGNOSIS — M792 Neuralgia and neuritis, unspecified: Secondary | ICD-10-CM

## 2017-05-14 DIAGNOSIS — G4700 Insomnia, unspecified: Secondary | ICD-10-CM | POA: Diagnosis not present

## 2017-05-14 DIAGNOSIS — M799 Soft tissue disorder, unspecified: Secondary | ICD-10-CM

## 2017-05-14 DIAGNOSIS — F339 Major depressive disorder, recurrent, unspecified: Secondary | ICD-10-CM | POA: Diagnosis not present

## 2017-05-14 DIAGNOSIS — I10 Essential (primary) hypertension: Secondary | ICD-10-CM | POA: Diagnosis not present

## 2017-05-14 DIAGNOSIS — Z124 Encounter for screening for malignant neoplasm of cervix: Secondary | ICD-10-CM

## 2017-05-14 DIAGNOSIS — F419 Anxiety disorder, unspecified: Secondary | ICD-10-CM
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
## 2017-05-15 ENCOUNTER — Telehealth: Payer: Self-pay

## 2017-05-15 ENCOUNTER — Encounter: Payer: Self-pay | Admitting: Family Medicine

## 2017-05-15 LAB — BASIC METABOLIC PANEL
BUN: 12 mg/dL (ref 7–25)
CO2: 30 mmol/L (ref 20–32)
Calcium: 9.3 mg/dL (ref 8.6–10.4)
Chloride: 104 mmol/L (ref 98–110)
Creat: 0.86 mg/dL (ref 0.50–0.99)
GLUCOSE: 89 mg/dL (ref 65–99)
Potassium: 4 mmol/L (ref 3.5–5.3)
SODIUM: 141 mmol/L (ref 135–146)

## 2017-05-15 NOTE — Telephone Encounter (Signed)
This [facility] introduced services to patient but patient unable to complete initial assessment.  This [facility] set up a follow up phone call appointment.

## 2017-05-15 NOTE — Telephone Encounter (Signed)
Patient wants me to call her back tomorrow at 2pm for the initial assessment.

## 2017-05-15 NOTE — Telephone Encounter (Signed)
This [facility] introduced services to patient but patient unable to complete initial assessment.  This [facility] set up a follow up phone call appointment.   The patient wants me to call her back tomorrow at 2pm to complete the intake assessment.

## 2017-05-16 ENCOUNTER — Encounter: Payer: Self-pay | Admitting: Family Medicine

## 2017-05-16 LAB — CYTOLOGY - PAP: DIAGNOSIS: NEGATIVE

## 2017-05-21 ENCOUNTER — Telehealth: Payer: Self-pay | Admitting: Family Medicine

## 2017-05-21 NOTE — Telephone Encounter (Signed)
[name] from pre-service center is requesting a call for a required precert for upcoming CT  [phone]

## 2017-05-21 NOTE — Telephone Encounter (Signed)
Approval acquired via Vilinda Blanks.

## 2017-05-26 ENCOUNTER — Ambulatory Visit (HOSPITAL_COMMUNITY)
Admission: RE | Admit: 2017-05-26 | Discharge: 2017-05-26 | Disposition: A | Discharge status: Home / Self Care | Payer: Commercial Managed Care - PPO | Source: Ambulatory Visit | Attending: Family Medicine | Admitting: Family Medicine

## 2017-05-26 ENCOUNTER — Encounter (HOSPITAL_COMMUNITY): Payer: Self-pay

## 2017-05-26 DIAGNOSIS — M799 Soft tissue disorder, unspecified: Secondary | ICD-10-CM | POA: Diagnosis present

## 2017-05-26 DIAGNOSIS — I739 Peripheral vascular disease, unspecified: Secondary | ICD-10-CM | POA: Diagnosis not present

## 2017-05-26 MED ORDER — IOPAMIDOL (ISOVUE-300) INJECTION 61%
75.0000 mL | Freq: Once | INTRAVENOUS | Status: AC | PRN
  Administered 2017-05-26: 75 mL via INTRAVENOUS

## 2017-05-28 ENCOUNTER — Telehealth: Payer: Self-pay

## 2017-05-28 ENCOUNTER — Telehealth: Payer: Self-pay | Admitting: Family Medicine

## 2017-05-28 DIAGNOSIS — M799 Soft tissue disorder, unspecified: Secondary | ICD-10-CM

## 2017-05-28 DIAGNOSIS — F339 Major depressive disorder, recurrent, unspecified: Secondary | ICD-10-CM

## 2017-05-28 DIAGNOSIS — F419 Anxiety disorder, unspecified: Secondary | ICD-10-CM

## 2017-05-28 NOTE — Telephone Encounter (Signed)
Spoke with patient and gave results of recent CT scan. Patient had questions regarding the Peripheral vascular atherosclerotic disease.  Patient also stated that "Ava" the therapist had called on March 28th, but because it was at an inconvenient time for her, they agreed that "Ava" would call the next day. "Ava" didn't call the next day, so the patient called the number she called from and didn't get an answer. No one answered the phone, so she left a message on the voicemail. Patient stated she has called several times and every time left a message on the voicemail because no one ever answers the phone. She doesn't understand what is going on.

## 2017-05-28 NOTE — Telephone Encounter (Signed)
[facility] will contact patient on 05-30-2017 at 3pm - patient is going to write out her schedule and let me know how she is ablel to have time for herself.  Then we are going to process whi it is important for her to have time for her self to do things that, "make her happy"

## 2017-05-28 NOTE — BH Specialist Note (Signed)
Virtual [facility] Initial Clinical Assessment  MRN: [id number] NAME: [name] Date: 05/28/17   Total time: 30 minutes  Type of Contact: Type of Contact: Phone Call Initial Contact Patient consent obtained: Patient consent obtained for Virtual Visit: (NA) Reason for Visit today: Reason for Your Call/Visit Today: [facility] Initial Assessment   Treatment History Patient recently received Inpatient Treatment: Have You Recently Been in Any Inpatient Treatment (Hospital/Detox/Crisis Center/28-Day Program)?: No  Facility/Program:  NA  Date of discharge:  NA Patient currently being seen by therapist/psychiatrist: Do You Currently Have a Therapist/Psychiatrist?: No Patient currently receiving the following services: Patient Currently Receiving the Following Services:: (NA)   Past outpatient therapy: Yes - 31yrs ago - unable to remember the name of the facility.   Past Psychiatric History/Hospitalization(s): Anxiety: Yes Bipolar Disorder: No Depression: Yes Mania: No Psychosis: No Schizophrenia: No Personality Disorder: No Hospitalization for psychiatric illness: No History of Electroconvulsive Shock Therapy: No Prior Suicide Attempts: No  Physical, Sexual or Emotional Abuse: No  Decreased need for sleep: No Euphoria: No  Family history of mental illness: No  Family history of substance abuse:No  Substance Abuse: No  Insomnia: No History of violence: No    Clinical Assessment:  PHQ-9 Assessments: Depression screen [facility] 2/9 05/28/2017 05/14/2017 03/13/2017  Decreased Interest 1 0 0  Down, Depressed, Hopeless 1 3 0  PHQ - 2 Score 2 3 0  Altered sleeping 0 0 -  Tired, decreased energy 0 0 -  Change in appetite 0 0 -  Feeling bad or failure about yourself  1 1 -  Trouble concentrating 0 0 -  Moving slowly or fidgety/restless 0 0 -  Suicidal thoughts 0 0 -  PHQ-9 Score 3 4 -  Difficult doing work/chores Not difficult at all Not difficult at all -    GAD-7 Assessments: GAD  7 : Generalized Anxiety Score 05/28/2017  Nervous, Anxious, on Edge 3  Control/stop worrying 2  Worry too much - different things 3  Trouble relaxing 2  Restless 1  Easily annoyed or irritable 0  Afraid - awful might happen 3  Total GAD 7 Score 14     Social Functioning Social maturity: Social Maturity: Responsible Social judgement: Social Judgement: Normal   Stress Current stressors: Current Stressors: (Anxiety assoicated with her right thigh; Caring for mom that has Alzheimers) Familial stressors:  None Reported Sleep: Sleep: No problems Appetite: Appetite: No problems Coping ability: Coping ability: Normal Patient taking medications as prescribed: Patient taking medications as prescribed: Yes - denies any negative side effects     Current medications:  Outpatient Encounter Medications as of 05/28/2017  Medication Sig  . aspirin EC 81 MG tablet Take 81 mg by mouth daily.  . Cholecalciferol (D3-1000) 1000 units capsule Take 4,000 Units by mouth daily.  Marland Kitchen gabapentin (NEURONTIN) 300 MG capsule TAKE TWO (2) CAPSULES BY MOUTH THREE TIMES DAILY.  . hydrochlorothiazide (HYDRODIURIL) 25 MG tablet TAKE ONE TABLET BY MOUTH DAILY.  . hydrOXYzine (ATARAX/VISTARIL) 25 MG tablet Take 25 mg by mouth 3 (three) times daily as needed.  . metoprolol succinate (TOPROL-XL) 50 MG 24 hr tablet Take 1 tablet (50 mg total) by mouth daily. Take with or immediately following a meal.  . Multiple Vitamin (MULTIVITAMIN) capsule Take 1 capsule by mouth daily.  . sertraline (ZOLOFT) 100 MG tablet Take 2 tablets (200 mg total) by mouth daily.  . temazepam (RESTORIL) 7.5 MG capsule TAKE ONE CAPSULE BY MOUTH AT BEDTIME AS NEEDED FOR SLEEP  .  traZODone (DESYREL) 100 MG tablet TAKE TWO (2) TABLETS BY MOUTH EVERY NIGHT AS DIRECTED   No facility-administered encounter medications on file as of 05/28/2017.     Self-harm Behaviors Risk Assessment Self-harm risk factors: Self-harm risk factors: (NA) Patient  endorses recent thoughts of harming self: Have you recently had any thoughts about harming yourself?: No    Danger to Others Risk Assessment Danger to others risk factors: Danger to Others Risk Factors: No risk factors noted Patient endorses recent thoughts of harming others: Notification required: No need or identified person      Goals, Interventions and Follow-up Plan Goals: Increase healthy adjustment to current life circumstances Interventions: Motivational Interviewing, Behavioral Activation and Supportive Counseling Follow-up Plan: [facility] Phone Follow Up   Summary of Clinical Assessment Summary:  Patient is a 74-yo female that report increased anxiety and depression associated with caring for her mother that is bed bound and has dementia.  In addition, to worrying about painful sensation in her right thing.    Patient reports that her life revolves around the care of her mother.  Patient reports that her sisters assists her with the care of her mother; however, she has begun to worry about everything and experiences anxiety attacks and depressive episodes regarding there own health and quality of life.  Patient reports that she is always anxious that something terrible is going to happen to her due to the pain in her upper right thigh.   Patient reports compliance with taking her medication and denies any side effects.  Patient reports that she use to enjoy gardening, teaching cycling classes, going to the movies and shopping and now she does not because caring for her mother has emotionally drained her.   Patient reports that she is a retired and lives at home part time with her husband .  Patient reports that she does not have any children.    Graciella Freer [place], LCAS-A

## 2017-05-28 NOTE — Telephone Encounter (Signed)
Please call patient and advised that the CT scan revealed: 1. No soft tissue mass at the site of clinical palpable abnormality in the mid medial right thigh. 2. Peripheral vascular atherosclerotic disease.  Advise her that at this time I am going to refer her to general surgery to evaluate the lesion and get their recommendations.  I referred her to Dr. Blake Divine.

## 2017-05-28 NOTE — Telephone Encounter (Signed)
Please call patient and advise her to follow up and we will discuss the dx of PAD. I got a message today that she had talked with Ava. Please confirm that this is correct. Gwen Her. Mannie Stabile, MD

## 2017-05-29 ENCOUNTER — Other Ambulatory Visit (HOSPITAL_COMMUNITY): Payer: Commercial Managed Care - PPO

## 2017-05-29 NOTE — Telephone Encounter (Signed)
Patient informed of message below, verbalized understanding. She did speak to Ava.

## 2017-06-05 ENCOUNTER — Ambulatory Visit (INDEPENDENT_AMBULATORY_CARE_PROVIDER_SITE_OTHER): Payer: Commercial Managed Care - PPO | Admitting: Neurology

## 2017-06-05 ENCOUNTER — Encounter: Payer: Self-pay | Admitting: Neurology

## 2017-06-05 VITALS — BP 135/83 | HR 70 | Ht 62.0 in | Wt 187.0 lb

## 2017-06-05 DIAGNOSIS — G5711 Meralgia paresthetica, right lower limb: Secondary | ICD-10-CM | POA: Diagnosis not present

## 2017-06-05 DIAGNOSIS — M5431 Sciatica, right side: Secondary | ICD-10-CM

## 2017-06-05 HISTORY — DX: Meralgia paresthetica, right lower limb: G57.11

## 2017-06-05 MED ORDER — CARBAMAZEPINE 200 MG PO TABS: ORAL_TABLET | ORAL | 3 refills | Status: DC

## 2017-06-05 NOTE — Progress Notes (Signed)
Reason for visit: Right leg pain  Referring physician: Dr. [name] is a 62 y.o. female  History of present illness:  [name] is a 62 year old right-handed black female with a history of arthroscopic surgery that occurred in February 2018.  Beginning in September 2018 she began having tingling sensations and burning sensations in the anterolateral aspect of her right thigh.  The patient has had some discomfort behind the knee on occasion, she also has noted some discomfort and numbness on the foot and toes as well.  She denies any significant low back pain per se.  The patient did undergo MRI of the lumbar spine that did show disc bulges at the L4-5 and L5-S1 level.  The patient was seen and evaluated by Dr. Kathyrn Sheriff from neurosurgery, surgery was not done.  The patient has undergone physical therapy from December 2018 until March 2019, she has had some improvement in the foot discomfort but the anterolateral aspect of the right thigh remains painful.  Ice will seem to help this.  She is on gabapentin taking 600 mg 3 times daily without complete benefit.  She reports no weakness of the lower extremities and no changes in balance.  She denies issues controlling the bowels or the bladder.  She does not have discomfort of the left leg.  She is sent to this office for further evaluation.  Past Medical History:  Diagnosis Date  . Anxiety   . Depression   . GERD (gastroesophageal reflux disease)   . Hypertension     Past Surgical History:  Procedure Laterality Date  . KNEE CARTILAGE SURGERY Right    Work[employer], fell out of chair at work.     Family History  Problem Relation Age of Onset  . Alzheimer's disease Mother   . Hypertension Sister   . Diabetes Maternal Uncle   . Hypertension Sister     Social history:  reports that she has quit smoking. She has never used smokeless tobacco. She reports that she does not drink alcohol or use drugs.  Medications:    Prior to Admission medications   Medication Sig Start Date End Date Taking? Authorizing Provider  aspirin EC 81 MG tablet Take 81 mg by mouth daily.   Yes [provider]  Cholecalciferol (D3-1000) 1000 units capsule Take 4,000 Units by mouth daily.   Yes [provider]  gabapentin (NEURONTIN) 300 MG capsule TAKE TWO (2) CAPSULES BY MOUTH THREE TIMES DAILY. 05/14/17  Yes Hagler, Apolonio Schneiders, MD  hydrochlorothiazide (HYDRODIURIL) 25 MG tablet TAKE ONE TABLET BY MOUTH DAILY. 05/01/17  Yes Caren Macadam, MD  hydrOXYzine (ATARAX/VISTARIL) 25 MG tablet Take 25 mg by mouth 3 (three) times daily as needed.   Yes [provider]  metoprolol succinate (TOPROL-XL) 50 MG 24 hr tablet Take 1 tablet (50 mg total) by mouth daily. Take with or immediately following a meal. 01/29/17  Yes Hagler, Apolonio Schneiders, MD  Multiple Vitamin (MULTIVITAMIN) capsule Take 1 capsule by mouth daily.   Yes [provider]  sertraline (ZOLOFT) 100 MG tablet Take 2 tablets (200 mg total) by mouth daily. 01/29/17  Yes Hagler, Apolonio Schneiders, MD  temazepam (RESTORIL) 7.5 MG capsule TAKE ONE CAPSULE BY MOUTH AT BEDTIME AS NEEDED FOR SLEEP 05/13/17  Yes Hagler, Apolonio Schneiders, MD  traZODone (DESYREL) 100 MG tablet TAKE TWO (2) TABLETS BY MOUTH EVERY NIGHT AS DIRECTED 05/01/17  Yes Caren Macadam, MD     No Known Allergies  ROS:  Out of a complete  14 system review of symptoms, the patient complains only of the following symptoms, and all other reviewed systems are negative.  Depression, anxiety, racing thoughts Skin sensitivity  Blood pressure 135/83, pulse 70, height 5\' 2"  (1.575 m), weight 187 lb (84.8 kg).  Physical Exam  General: The patient is alert and cooperative at the time of the examination.  The patient is moderately obese.  Eyes: Pupils are equal, round, and reactive to light. Discs are flat bilaterally.  Neck: The neck is supple, no carotid bruits are noted.  Respiratory: The respiratory examination is  clear.  Cardiovascular: The cardiovascular examination reveals a regular rate and rhythm, no obvious murmurs or rubs are noted.  Neuromuscular: Range of movement the lumbar spine was full.  Skin: Extremities are without significant edema.  Neurologic Exam  Mental status: The patient is alert and oriented x 3 at the time of the examination. The patient has apparent normal recent and remote memory, with an apparently normal attention span and concentration ability.  Cranial nerves: Facial symmetry is present. There is good sensation of the face to pinprick and soft touch bilaterally. The strength of the facial muscles and the muscles to head turning and shoulder shrug are normal bilaterally. Speech is well enunciated, no aphasia or dysarthria is noted. Extraocular movements are full. Visual fields are full. The tongue is midline, and the patient has symmetric elevation of the soft palate. No obvious hearing deficits are noted.  Motor: The motor testing reveals 5 over 5 strength of all 4 extremities. Good symmetric motor tone is noted throughout.  Sensory: Sensory testing is intact to pinprick, soft touch, vibration sensation, and position sense on all 4 extremities. No evidence of extinction is noted.  Coordination: Cerebellar testing reveals good finger-nose-finger and heel-to-shin bilaterally.  Gait and station: Gait is normal. Tandem gait is normal. Romberg is negative. No drift is seen.  The patient is able to walk on heels and the toes bilaterally.  Reflexes: Deep tendon reflexes are symmetric and normal bilaterally. Toes are downgoing bilaterally.   MRI lumbar 01/03/17:  IMPRESSION: 1. At L3-4 there is a minimal broad-based disc bulge. Mild bilateral facet arthropathy. Mild left foraminal stenosis. 2. At L4-5 there is a broad-based disc bulge eccentric towards the right. Mild bilateral facet arthropathy. Right lateral recess stenosis. Moderate right foraminal stenosis. 3. At  L5-S1 there is a broad-based disc bulge. Mild bilateral facet arthropathy. Moderate right foraminal stenosis. Mild left foraminal Stenosis.  * MRI scan images were reviewed online. I agree with the written report.   Assessment/Plan:  1.  Right meralgia paresthetica  2.  Right foot numbness, discomfort  The patient may have 2 separate issues at work.  The patient is mainly bothered by the anterolateral aspect of the right thigh with discomfort and burning.  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  The patient is not getting complete improvement with her gabapentin.  She reports only about an 8 pound gain of weight in the last year.  She will be placed on carbamazepine working up to 200 mg twice daily to see if this helps her discomfort.  The patient will be set up for nerve conduction studies on both legs and EMG on the right leg.  The patient will follow-up in about 4 months.  Jill Alexanders MD 06/05/2017 2:05 PM  [facility] [address] [place], [place] [address]  Phone [phone] Fax [phone]

## 2017-06-05 NOTE — Patient Instructions (Signed)
   We will start Carbamazepine for the leg pain.    Tegretol (carbamazepine) may result in dizziness, gait instability, cognitive slowing, or drowsiness. Sometimes, and allergic rash may occur, or a photosensitive rash may occur. If any significant side effects are noted, please contact our office.

## 2017-06-10 ENCOUNTER — Telehealth: Payer: Self-pay

## 2017-06-10 NOTE — Telephone Encounter (Signed)
Writer lefta voice mail message. - Informig her that I would be out of  the office from May 2 to May 28 and [place] and Opal Sidles will be following up with her.

## 2017-06-11 ENCOUNTER — Telehealth (HOSPITAL_COMMUNITY): Payer: Self-pay

## 2017-06-11 NOTE — Telephone Encounter (Signed)
Writer discussed patient with Dr. Dwyane Dee on 05/28/2017 and there were no medication recommendations.

## 2017-06-13 ENCOUNTER — Other Ambulatory Visit: Payer: Self-pay | Admitting: Family Medicine

## 2017-06-13 DIAGNOSIS — F99 Mental disorder, not otherwise specified: Principal | ICD-10-CM

## 2017-06-13 DIAGNOSIS — F5105 Insomnia due to other mental disorder: Secondary | ICD-10-CM

## 2017-06-17 ENCOUNTER — Other Ambulatory Visit: Payer: Self-pay

## 2017-06-17 ENCOUNTER — Encounter: Payer: Self-pay | Admitting: Family Medicine

## 2017-06-17 ENCOUNTER — Ambulatory Visit (INDEPENDENT_AMBULATORY_CARE_PROVIDER_SITE_OTHER): Payer: Commercial Managed Care - PPO | Admitting: Family Medicine

## 2017-06-17 VITALS — BP 124/76 | HR 73 | Temp 98.1°F | Resp 16 | Ht 62.0 in | Wt 184.8 lb

## 2017-06-17 DIAGNOSIS — G5711 Meralgia paresthetica, right lower limb: Secondary | ICD-10-CM | POA: Diagnosis not present

## 2017-06-17 DIAGNOSIS — I10 Essential (primary) hypertension: Secondary | ICD-10-CM | POA: Diagnosis not present

## 2017-06-17 DIAGNOSIS — E669 Obesity, unspecified: Secondary | ICD-10-CM | POA: Diagnosis not present

## 2017-06-17 DIAGNOSIS — E785 Hyperlipidemia, unspecified: Secondary | ICD-10-CM | POA: Diagnosis not present

## 2017-06-17 MED ORDER — ATORVASTATIN CALCIUM 20 MG PO TABS: 20.0000 mg | ORAL_TABLET | Freq: Every day | ORAL | 3 refills | Status: AC

## 2017-06-17 NOTE — Patient Instructions (Signed)
DASH Eating Plan DASH stands for "Dietary Approaches to Stop Hypertension." The DASH eating plan is a healthy eating plan that has been shown to reduce high blood pressure (hypertension). It may also reduce your risk for type 2 diabetes, heart disease, and stroke. The DASH eating plan may also help with weight loss. What are tips for following this plan? General guidelines  Avoid eating more than 2,300 mg (milligrams) of salt (sodium) a day. If you have hypertension, you may need to reduce your sodium intake to 1,500 mg a day.  Limit alcohol intake to no more than 1 drink a day for nonpregnant women and 2 drinks a day for men. One drink equals 12 oz of beer, 5 oz of wine, or 1 oz of hard liquor.  Work with your health care provider to maintain a healthy body weight or to lose weight. Ask what an ideal weight is for you.  Get at least 30 minutes of exercise that causes your heart to beat faster (aerobic exercise) most days of the week. Activities may include walking, swimming, or biking.  Work with your health care provider or diet and nutrition specialist (dietitian) to adjust your eating plan to your individual calorie needs. Reading food labels  Check food labels for the amount of sodium per serving. Choose foods with less than 5 percent of the Daily Value of sodium. Generally, foods with less than 300 mg of sodium per serving fit into this eating plan.  To find whole grains, look for the word "whole" as the first word in the ingredient list. Shopping  Buy products labeled as "low-sodium" or "no salt added."  Buy fresh foods. Avoid canned foods and premade or frozen meals. Cooking  Avoid adding salt when cooking. Use salt-free seasonings or herbs instead of table salt or sea salt. Check with your health care provider or pharmacist before using salt substitutes.  Do not fry foods. Cook foods using healthy methods such as baking, boiling, grilling, and broiling instead.  Cook with  heart-healthy oils, such as olive, canola, soybean, or sunflower oil. Meal planning   Eat a balanced diet that includes: ? 5 or more servings of fruits and vegetables each day. At each meal, try to fill half of your plate with fruits and vegetables. ? Up to 6-8 servings of whole grains each day. ? Less than 6 oz of lean meat, poultry, or fish each day. A 3-oz serving of meat is about the same size as a deck of cards. One egg equals 1 oz. ? 2 servings of low-fat dairy each day. ? A serving of nuts, seeds, or beans 5 times each week. ? Heart-healthy fats. Healthy fats called Omega-3 fatty acids are found in foods such as flaxseeds and coldwater fish, like sardines, salmon, and mackerel.  Limit how much you eat of the following: ? Canned or prepackaged foods. ? Food that is high in trans fat, such as fried foods. ? Food that is high in saturated fat, such as fatty meat. ? Sweets, desserts, sugary drinks, and other foods with added sugar. ? Full-fat dairy products.  Do not salt foods before eating.  Try to eat at least 2 vegetarian meals each week.  Eat more home-cooked food and less restaurant, buffet, and fast food.  When eating at a restaurant, ask that your food be prepared with less salt or no salt, if possible. What foods are recommended? The items listed may not be a complete list. Talk with your dietitian about what   dietary choices are best for you. Grains Whole-grain or whole-wheat bread. Whole-grain or whole-wheat pasta. Brown rice. Oatmeal. Quinoa. Bulgur. Whole-grain and low-sodium cereals. Pita bread. Low-fat, low-sodium crackers. Whole-wheat flour tortillas. Vegetables Fresh or frozen vegetables (raw, steamed, roasted, or grilled). Low-sodium or reduced-sodium tomato and vegetable juice. Low-sodium or reduced-sodium tomato sauce and tomato paste. Low-sodium or reduced-sodium canned vegetables. Fruits All fresh, dried, or frozen fruit. Canned fruit in natural juice (without  added sugar). Meat and other protein foods Skinless chicken or turkey. Ground chicken or turkey. Pork with fat trimmed off. Fish and seafood. Egg whites. Dried beans, peas, or lentils. Unsalted nuts, nut butters, and seeds. Unsalted canned beans. Lean cuts of beef with fat trimmed off. Low-sodium, lean deli meat. Dairy Low-fat (1%) or fat-free (skim) milk. Fat-free, low-fat, or reduced-fat cheeses. Nonfat, low-sodium ricotta or cottage cheese. Low-fat or nonfat yogurt. Low-fat, low-sodium cheese. Fats and oils Soft margarine without trans fats. Vegetable oil. Low-fat, reduced-fat, or light mayonnaise and salad dressings (reduced-sodium). Canola, safflower, olive, soybean, and sunflower oils. Avocado. Seasoning and other foods Herbs. Spices. Seasoning mixes without salt. Unsalted popcorn and pretzels. Fat-free sweets. What foods are not recommended? The items listed may not be a complete list. Talk with your dietitian about what dietary choices are best for you. Grains Baked goods made with fat, such as croissants, muffins, or some breads. Dry pasta or rice meal packs. Vegetables Creamed or fried vegetables. Vegetables in a cheese sauce. Regular canned vegetables (not low-sodium or reduced-sodium). Regular canned tomato sauce and paste (not low-sodium or reduced-sodium). Regular tomato and vegetable juice (not low-sodium or reduced-sodium). Pickles. Olives. Fruits Canned fruit in a light or heavy syrup. Fried fruit. Fruit in cream or butter sauce. Meat and other protein foods Fatty cuts of meat. Ribs. Fried meat. Bacon. Sausage. Bologna and other processed lunch meats. Salami. Fatback. Hotdogs. Bratwurst. Salted nuts and seeds. Canned beans with added salt. Canned or smoked fish. Whole eggs or egg yolks. Chicken or turkey with skin. Dairy Whole or 2% milk, cream, and half-and-half. Whole or full-fat cream cheese. Whole-fat or sweetened yogurt. Full-fat cheese. Nondairy creamers. Whipped toppings.  Processed cheese and cheese spreads. Fats and oils Butter. Stick margarine. Lard. Shortening. Ghee. Bacon fat. Tropical oils, such as coconut, palm kernel, or palm oil. Seasoning and other foods Salted popcorn and pretzels. Onion salt, garlic salt, seasoned salt, table salt, and sea salt. Worcestershire sauce. Tartar sauce. Barbecue sauce. Teriyaki sauce. Soy sauce, including reduced-sodium. Steak sauce. Canned and packaged gravies. Fish sauce. Oyster sauce. Cocktail sauce. Horseradish that you find on the shelf. Ketchup. Mustard. Meat flavorings and tenderizers. Bouillon cubes. Hot sauce and Tabasco sauce. Premade or packaged marinades. Premade or packaged taco seasonings. Relishes. Regular salad dressings. Where to find more information:  National Heart, Lung, and Blood Institute: www.nhlbi.nih.gov  American Heart Association: www.heart.org Summary  The DASH eating plan is a healthy eating plan that has been shown to reduce high blood pressure (hypertension). It may also reduce your risk for type 2 diabetes, heart disease, and stroke.  With the DASH eating plan, you should limit salt (sodium) intake to 2,300 mg a day. If you have hypertension, you may need to reduce your sodium intake to 1,500 mg a day.  When on the DASH eating plan, aim to eat more fresh fruits and vegetables, whole grains, lean proteins, low-fat dairy, and heart-healthy fats.  Work with your health care provider or diet and nutrition specialist (dietitian) to adjust your eating plan to your individual   calorie needs. This information is not intended to replace advice given to you by your health care provider. Make sure you discuss any questions you have with your health care provider. Document Released: 01/24/2011 Document Revised: 01/29/2016 Document Reviewed: 01/29/2016 [employer] Interactive Patient Education  2018 [employer]  

## 2017-06-17 NOTE — Progress Notes (Signed)
Patient ID: [name], female    DOB: 08/13/1955, 62 y.o.   MRN: [id number]  Chief Complaint  Patient presents with  . Follow-up    Allergies Patient has no known allergies.  Subjective:   [name] is a 62 y.o. female who presents to [facility] today.  HPI [name] presents today for follow-up of her blood pressure.  She has been taking her medications as directed.  She would also like to discuss the CT scan of the questionable lesion in her right thigh.  The radiology report indicated there was evidence of peripheral vascular disease and she would like to discuss this.  She has an upcoming appointment with Dr. Constance Haw to review the CT and for examination of the area in question.  She reports she is also been seen by neurology since her last visit.  She was placed on carbamazepine by Dr. Jannifer Franklin and neurology.  She reports that her symptoms are somewhat better with the numbness and tingling in her right thigh.  She reports her mood is slightly improved.  Is still talking with behavioral health on the phone.  Is taking her medications as directed.  Is trying to participate in self-care and take better care of herself.  Would be interested in seeing a dietitian and nutritionist to help with losing weight and getting a better diet.  She reports that she does not always eat well.  Her cholesterol at last check approximately 6 months ago did reveal an elevated total and elevated LDL.  She did have a very good HDL.  She is not routinely exercising on a daily basis.  She is still very busy taking care of her mother with Alzheimer's disease.   Past Medical History:  Diagnosis Date  . Anxiety   . Depression   . GERD (gastroesophageal reflux disease)   . Hypertension   . Meralgia paresthetica of right side 06/05/2017    Past Surgical History:  Procedure Laterality Date  . KNEE CARTILAGE SURGERY Right    Workers Compensation, fell out of chair at work.     Family History    Problem Relation Age of Onset  . Alzheimer's disease Mother   . Hypertension Sister   . Diabetes Maternal Uncle   . Hypertension Sister      Social History   Socioeconomic History  . Marital status: Married    Spouse name: Not on file  . Number of children: Not on file  . Years of education: Not on file  . Highest education level: Not on file  Occupational History  . Not on file  Social Needs  . Financial resource strain: Not on file  . Food insecurity:    Worry: Not on file    Inability: Not on file  . Transportation needs:    Medical: Not on file    Non-medical: Not on file  Tobacco Use  . Smoking status: Former Research scientist (life sciences)  . Smokeless tobacco: Never Used  Substance and Sexual Activity  . Alcohol use: No  . Drug use: No  . Sexual activity: Yes    Birth control/protection: Post-menopausal  Lifestyle  . Physical activity:    Days per week: Not on file    Minutes per session: Not on file  . Stress: Not on file  Relationships  . Social connections:    Talks on phone: Not on file    Gets together: Not on file    Attends religious service: Not on file  Active member of club or organization: Not on file    Attends meetings of clubs or organizations: Not on file    Relationship status: Not on file  Other Topics Concern  . Not on file  Social History Narrative   Lives   Caffeine use:    Married. 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    Spends every 3rd night at [employer].    Has been taking care of mother since 26.    Current Outpatient Medications on File Prior to Visit  Medication Sig Dispense Refill  . aspirin EC 81 MG tablet Take 81 mg by mouth daily.    . carbamazepine (TEGRETOL) 200 MG tablet 1/2 tablet twice a day for 2 weeks, then take 1 tablet twice a day 60 tablet 3  . Cholecalciferol (D3-1000) 1000 units capsule Take 4,000 Units by mouth daily.    Marland Kitchen gabapentin (NEURONTIN) 300 MG capsule TAKE TWO (2) CAPSULES BY MOUTH THREE TIMES  DAILY. 180 capsule 1  . hydrochlorothiazide (HYDRODIURIL) 25 MG tablet TAKE ONE TABLET BY MOUTH DAILY. 30 tablet 0  . hydrOXYzine (ATARAX/VISTARIL) 25 MG tablet Take 25 mg by mouth 3 (three) times daily as needed.    . metoprolol succinate (TOPROL-XL) 50 MG 24 hr tablet Take 1 tablet (50 mg total) by mouth daily. Take with or immediately following a meal. 90 tablet 3  . Multiple Vitamin (MULTIVITAMIN) capsule Take 1 capsule by mouth daily.    . sertraline (ZOLOFT) 100 MG tablet Take 2 tablets (200 mg total) by mouth daily. 60 tablet 3  . temazepam (RESTORIL) 7.5 MG capsule TAKE ONE CAPSULE BY MOUTH AT BEDTIME AS NEEDED FOR SLEEP 30 capsule 1  . traZODone (DESYREL) 100 MG tablet TAKE TWO (2) TABLETS BY MOUTH EVERY NIGHT AS DIRECTED 180 tablet 0   No current facility-administered medications on file prior to visit.     Review of Systems  Constitutional: Negative for activity change, appetite change and fever.  HENT: Negative for trouble swallowing and voice change.   Eyes: Negative for visual disturbance.  Respiratory: Negative for cough, chest tightness and shortness of breath.   Cardiovascular: Negative for chest pain, palpitations and leg swelling.  Gastrointestinal: Negative for abdominal pain, nausea and vomiting.  Genitourinary: Negative for dysuria, frequency and urgency.  Musculoskeletal: Negative for myalgias.  Skin: Negative for rash.  Neurological: Negative for dizziness, syncope and light-headedness.  Hematological: Negative for adenopathy.  Psychiatric/Behavioral: Negative for behavioral problems, dysphoric mood, self-injury and suicidal ideas. The patient is not nervous/anxious.      Objective:   BP 124/76 (BP Location: Left Arm, Patient Position: Sitting, Cuff Size: Normal)   Pulse 73   Temp 98.1 F (36.7 C) (Temporal)   Resp 16   Ht 5\' 2"  (1.575 m)   Wt 184 lb 12 oz (83.8 kg)   SpO2 96%   BMI 33.79 kg/m   Physical Exam  Constitutional: She is oriented to  person, place, and time. She appears well-developed and well-nourished. No distress.  HENT:  Head: Normocephalic and atraumatic.  Eyes: Pupils are equal, round, and reactive to light. Conjunctivae are normal. No scleral icterus.  Neck: Normal range of motion. Neck supple. No JVD present. No tracheal deviation present. No thyromegaly present.  Cardiovascular: Normal rate, regular rhythm, normal heart sounds and intact distal pulses.  No murmur heard. Pulmonary/Chest: Effort normal and breath sounds normal. No respiratory distress.  Musculoskeletal: She exhibits no edema.  Lymphadenopathy:    She has no  cervical adenopathy.  Neurological: She is alert and oriented to person, place, and time. No cranial nerve deficit.  Skin: Skin is warm and dry. Capillary refill takes less than 2 seconds.  Psychiatric: She has a normal mood and affect. Her behavior is normal. Judgment and thought content normal.  Nursing note and vitals reviewed.    Assessment and Plan   1. HTN, goal below 140/90 Stable.  Continue current medications.  Blood pressure cuff was checked today which is reading approximately 10 mmHg higher than our office cuff.  Diet, exercise, and weight loss modifications recommended.  - Amb ref to Medical Nutrition Therapy-MNT  2. Hyperlipidemia LDL goal <100 Last FLP was reviewed with patient.  Her calculated 10-year cardiovascular risk of approximately 7%.  She is currently taking aspirin 81 mg a day.  We did discuss the CT results revealing evidence of peripheral vascular disease.  She has no symptoms and she does not have claudication.  She has palpable pulses in her lower extremities and evidence of hair growth on her feet.  We did discuss that the CT scan showed evidence of peripheral vascular disease meaning that there was evidence of plaque most likely calcified buildup in her peripheral arteries.  We discussed that the way to keep this from progression would be to control her cardiac  risk factors.  We discussed that the best way to do this is to eat a healthy diet, exercise, take aspirin as directed, and for her cholesterol and blood pressure to be very well controlled.  She would like to return to the lab and if her blood cholesterol is not significantly improved to an LDL less than 100, would initiate Lipitor at that time.  Hyperlipidemia and the associated risk of ASCVD were discussed today. Primary vs. Secondary prevention of ASCVD were discussed and how it relates to patient morbidity, mortality, and quality of life. Shared decision making with patient including the risks of statins vs.benefits of ASCVD risk reduction discussed.  Risks of stains discussed including myopathy, rhabdomyoloysis, liver problems, increased risk of diabetes discussed. We discussed heart healthy diet, lifestyle modifications, risk factor modifications, and adherence to the recommended treatment plan. We discussed the need to periodically monitor lipid panel and liver function tests while on statin therapy.   Patient will return to the lab in the next couple days to have that checked.  We will also refer her to nutritionist at this time. - Lipid panel - atorvastatin (LIPITOR) 20 MG tablet; Take 1 tablet (20 mg total) by mouth daily.  Dispense: 90 tablet; Refill: 3 - Amb ref to Medical Nutrition Therapy-MNT  3. Obesity (BMI 30-39.9) We did discuss that healthy weight would help to decrease her risk of developing diabetes and to help decrease her cardiovascular risk. - Amb ref to Medical Nutrition Therapy-MNT  4. Meralgia paresthetica of right side Continue carbamazepine as directed by neurology.  Continue follow-up with behavioral health. Office visit was greater than 40 minutes today.  Greater than 50% spent counseling and coordinating care. Keep scheduled upcoming office visit with Dr. Constance Haw for right inner thigh mass. Call with questions, concerns, or worrisome symptoms. Return in about 2  months (around 08/17/2017) for follow up. Caren Macadam, MD 06/17/2017

## 2017-06-25 ENCOUNTER — Other Ambulatory Visit: Payer: Self-pay | Admitting: Family Medicine

## 2017-06-25 DIAGNOSIS — I10 Essential (primary) hypertension: Secondary | ICD-10-CM

## 2017-06-26 ENCOUNTER — Encounter: Payer: Self-pay | Admitting: General Surgery

## 2017-06-26 ENCOUNTER — Ambulatory Visit (INDEPENDENT_AMBULATORY_CARE_PROVIDER_SITE_OTHER): Payer: Commercial Managed Care - PPO | Admitting: General Surgery

## 2017-06-26 VITALS — BP 132/81 | HR 73 | Temp 97.9°F | Wt 185.0 lb

## 2017-06-26 DIAGNOSIS — D1723 Benign lipomatous neoplasm of skin and subcutaneous tissue of right leg: Secondary | ICD-10-CM

## 2017-06-26 NOTE — Progress Notes (Signed)
[facility] History and Physical  Reason for Referral:Lipoma right medial thigh  Referring Physician:  Dr. Alphonse Guild [name] is a 62 y.o. female.  HPI: [name] is a 62 yo who has recent diagnosis of meralgia paresthetic on the right lower extremity and has been referred to neurology regarding this, but in the mean time has been seen by her PCP, Dr. Mannie Stabile and a CT scan was done due to concern for a mass in her right medial thigh. The area has been enlarged for over 20 years, and has never caused her much discomfort or issue. It has grown slowly over this time, but has been about this size for years.  She started wondering about the lipoma when she she started having these "hypersensitivity" issues in her right leg did she think much of it.  She underwent a CT that demonstrated some PVD and no findings of a lipoma were reported on the read of the CT .  On my review, I see an obvious clear septation with demarcated area of fat consistent with lipoma on the right medial thigh, measuring about 3X4X13 cm.  I reviewed the image with Dr. Thornton Papas, radiology who agrees and will see if the prior radiologist will do an addendum of the read.   The patient has been doing PT and is now on gabapentin and carbamazepine for her hypersensitivity of the right leg, and this has improved somewhat. She is here today because she wanted to know if this lipoma could be causing any of these issues.   Past Medical History:  Diagnosis Date  . Anxiety   . Depression   . GERD (gastroesophageal reflux disease)   . Hypertension   . Meralgia paresthetica of right side 06/05/2017    Past Surgical History:  Procedure Laterality Date  . KNEE CARTILAGE SURGERY Right    Workers Compensation, fell out of chair at work.     Family History  Problem Relation Age of Onset  . Alzheimer's disease Mother   . Hypertension Sister   . Diabetes Maternal Uncle   . Hypertension Sister     Social History    Tobacco Use  . Smoking status: Former Research scientist (life sciences)  . Smokeless tobacco: Never Used  Substance Use Topics  . Alcohol use: No  . Drug use: No    Medications: I have reviewed the patient's current medications. Allergies as of 06/26/2017   No Known Allergies     Medication List        Accurate as of 06/26/17  3:02 PM. Always use your most recent med list.          aspirin EC 81 MG tablet Take 81 mg by mouth daily.   atorvastatin 20 MG tablet Commonly known as:  LIPITOR Take 1 tablet (20 mg total) by mouth daily.   carbamazepine 200 MG tablet Commonly known as:  TEGRETOL 1/2 tablet twice a day for 2 weeks, then take 1 tablet twice a day   D3-1000 1000 units capsule Generic drug:  Cholecalciferol Take 4,000 Units by mouth daily.   gabapentin 300 MG capsule Commonly known as:  NEURONTIN TAKE TWO (2) CAPSULES BY MOUTH THREE TIMES DAILY.   hydrochlorothiazide 25 MG tablet Commonly known as:  HYDRODIURIL TAKE ONE TABLET BY MOUTH DAILY.   hydrOXYzine 25 MG tablet Commonly known as:  ATARAX/VISTARIL Take 25 mg by mouth 3 (three) times daily as needed.   metoprolol succinate 50 MG 24 hr tablet Commonly known as:  TOPROL-XL Take 1 tablet (50 mg total) by mouth daily. Take with or immediately following a meal.   multivitamin capsule Take 1 capsule by mouth daily.   sertraline 100 MG tablet Commonly known as:  ZOLOFT Take 2 tablets (200 mg total) by mouth daily.   temazepam 7.5 MG capsule Commonly known as:  RESTORIL TAKE ONE CAPSULE BY MOUTH AT BEDTIME AS NEEDED FOR SLEEP   traZODone 100 MG tablet Commonly known as:  DESYREL TAKE TWO (2) TABLETS BY MOUTH EVERY NIGHT AS DIRECTED        ROS:  A comprehensive review of systems was negative except for: Cardiovascular: positive for HTN Musculoskeletal: positive for right leg hypersensitive/ pain  Blood pressure 132/81, pulse 73, temperature 97.9 F (36.6 C), weight 185 lb (83.9 kg). Physical Exam  Results: CT  extremity - personally reviewed - area with septation that separates the concerning area from the remaining fat, asymetric from the left thigh, reviewed with Dr. Thornton Papas as above, who agrees with lipoma as the diagnosis, no concerning findings, there is a small calcified spot from likely fat necrosis, I measure it at 3X4X13cm   EXAM: CT OF THE LOWER RIGHT EXTREMITY WITH CONTRAST  TECHNIQUE: Multidetector CT imaging of the lower right extremity was performed according to the standard protocol following intravenous contrast administration.  COMPARISON:  None.  CONTRAST:  55mL ISOVUE-300 IOPAMIDOL (ISOVUE-300) INJECTION 61%  FINDINGS: Bones/Joint/Cartilage  No fracture or dislocation. Normal alignment. No joint effusion. No aggressive osseous lesion. No periosteal reaction or bone destruction.  Ligaments  Ligaments are suboptimally evaluated by CT.  Muscles and Tendons Muscles are normal. No muscle atrophy. No intramuscular fluid collection or hematoma. No intramuscular mass. No areas of enhancement.  Soft tissue No fluid collection or hematoma. No soft tissue mass. No inguinal lymphadenopathy. No soft tissue mass at the site of clinical palpable abnormality in the mid medial right thigh. Peripheral vascular atherosclerotic disease.  IMPRESSION: 1. No soft tissue mass at the site of clinical palpable abnormality in the mid medial right thigh. 2. Peripheral vascular atherosclerotic disease.  Assessment & Plan:  [name] is a 62 y.o. female with a right medial thigh lipoma (radiology agrees, hopefully CT will get addended).  She is not really having any discomfort or issues from this area, but her main concern is her hypersensitivity in the leg, and she was wondering if this lesion could cause any nerve compression. I assured her that this is very superficial in the fat and not around or close to any nerves or vessels. I do not think this is causing her symptoms of  hypersensitivity, and I do not think removing it will improve her symptoms. In addition, the location in the groin makes for a difficult to heal incision that will be prone to infection and breakdown.  At this time, given that the area is not really bothersome to her and has no concerning features and is not expanding rapidly, I would not recommend removing unless it becomes more bothersome for her.   -Patient will call with decision about surgery and if something changes, but at this time she is holding off.   -We discussed the risk and benefits of removal including bleeding, infection, risk of breakdown given the location, and risk of recurrence, and low risk of any alternative pathology being found like a sarcoma.    All questions were answered to the satisfaction of the patient.    Virl Cagey 06/26/2017, 3:02 PM

## 2017-06-26 NOTE — Patient Instructions (Addendum)
Will call you once reviewed the CT with the radiologist.   Possible? Lipoma A lipoma is a noncancerous (benign) tumor that is made up of fat cells. This is a very common type of soft-tissue growth. Lipomas are usually found under the skin (subcutaneous). They may occur in any tissue of the body that contains fat. Common areas for lipomas to appear include the back, shoulders, buttocks, and thighs. Lipomas grow slowly, and they are usually painless. Most lipomas do not cause problems and do not require treatment. What are the causes? The cause of this condition is not known. What increases the risk? This condition is more likely to develop in:  People who are 55-26 years old.  People who have a family history of lipomas.  What are the signs or symptoms? A lipoma usually appears as a small, round bump under the skin. It may feel soft or rubbery, but the firmness can vary. Most lipomas are not painful. However, a lipoma may become painful if it is located in an area where it pushes on nerves. How is this diagnosed? A lipoma can usually be diagnosed with a physical exam. You may also have tests to confirm the diagnosis and to rule out other conditions. Tests may include:  Imaging tests, such as a CT scan or MRI.  Removal of a tissue sample to be looked at under a microscope (biopsy).  How is this treated? Treatment is not needed for small lipomas that are not causing problems. If a lipoma continues to get bigger or it causes problems, removal is often the best option. Lipomas can also be removed to improve appearance. Removal of a lipoma is usually done with a surgery in which the fatty cells and the surrounding capsule are removed. Most often, a medicine that numbs the area (local anesthetic) is used for this procedure. Follow these instructions at home:  Keep all follow-up visits as directed by your health care provider. This is important. Contact a health care provider if:  Your lipoma  becomes larger or hard.  Your lipoma becomes painful, red, or increasingly swollen. These could be signs of infection or a more serious condition. This information is not intended to replace advice given to you by your health care provider. Make sure you discuss any questions you have with your health care provider. Document Released: 01/25/2002 Document Revised: 07/13/2015 Document Reviewed: 01/31/2014 Elsevier Interactive Patient Education  [employer].

## 2017-06-27 ENCOUNTER — Encounter: Payer: Self-pay | Admitting: General Surgery

## 2017-06-27 ENCOUNTER — Telehealth: Payer: Self-pay | Admitting: General Surgery

## 2017-06-27 DIAGNOSIS — D1723 Benign lipomatous neoplasm of skin and subcutaneous tissue of right leg: Secondary | ICD-10-CM | POA: Insufficient documentation

## 2017-06-27 NOTE — Telephone Encounter (Signed)
Reviewed CT extremity with Dr. Thornton Papas, radiology, he agrees lipoma that was not called on the read.  He will see if the radiologist that read it wants to do an addendum.  Updated patient.   Curlene Labrum, MD [facility] [address] [place], [place] [address] [phone] (office)

## 2017-07-10 LAB — LIPID PANEL
Cholesterol: 272 mg/dL — ABNORMAL HIGH (ref <200)
HDL: 56 mg/dL (ref >50)
LDL Cholesterol (Calc): 189 mg/dL (calc) — ABNORMAL HIGH
Non-HDL Cholesterol (Calc): 216 mg/dL (calc) — ABNORMAL HIGH (ref <130)
TRIGLYCERIDES: 135 mg/dL (ref <150)
Total CHOL/HDL Ratio: 4.9 (calc) (ref <5.0)

## 2017-07-11 ENCOUNTER — Telehealth: Payer: Self-pay | Admitting: Family Medicine

## 2017-07-11 NOTE — Telephone Encounter (Signed)
Called patient [facility] for her to call back

## 2017-07-11 NOTE — Telephone Encounter (Signed)
Please call and ask if she stopped her cholesterol medication? Her LDL is much higher on this last set of labs. Gwen Her. Mannie Stabile, MD

## 2017-07-15 ENCOUNTER — Other Ambulatory Visit: Payer: Self-pay | Admitting: Family Medicine

## 2017-07-15 DIAGNOSIS — F99 Mental disorder, not otherwise specified: Principal | ICD-10-CM

## 2017-07-15 DIAGNOSIS — F5105 Insomnia due to other mental disorder: Secondary | ICD-10-CM

## 2017-07-15 NOTE — Telephone Encounter (Signed)
Patient has not started her chlosterol medication because she thought that her & Dr.Hagler decided that she was going to wait until after she had her blood work done. She said it is filled at [facility] but she was waiting until someone called her to discuss.

## 2017-07-16 ENCOUNTER — Encounter: Payer: Commercial Managed Care - PPO | Admitting: Neurology

## 2017-07-17 ENCOUNTER — Telehealth: Payer: Self-pay | Admitting: Family Medicine

## 2017-07-17 NOTE — Telephone Encounter (Signed)
Patient wants to know if she should go ahead and start taking the cholesterol medication or wait until you discuss it at the next office visit.

## 2017-07-17 NOTE — Telephone Encounter (Signed)
Spoke with patient and let her know I had sent Dr.Hagler a message asking her if she wanted her to go ahead and start the cholesterol medication or wait until her f/u visit at the end of June. She verbalized understanding.

## 2017-07-17 NOTE — Telephone Encounter (Signed)
Patient called at 3:25pm yesterday (left voicemail) requesting a call back from you or Dr.Hagler to discuss her cholesterol medication.

## 2017-07-18 NOTE — Telephone Encounter (Signed)
Spoke with patient and advised of Dr. Haglers recommendations with verbal understanding.  

## 2017-07-18 NOTE — Telephone Encounter (Signed)
Yes.  Please advised that her cholesterol was very high and she should be taking the cholesterol-lowering medication.  She should take this medication in the evening as we discussed.

## 2017-07-24 ENCOUNTER — Encounter: Payer: Self-pay | Admitting: Family Medicine

## 2017-07-25 ENCOUNTER — Encounter: Payer: Self-pay | Admitting: Family Medicine

## 2017-07-28 ENCOUNTER — Telehealth: Payer: Self-pay

## 2017-07-28 NOTE — Telephone Encounter (Signed)
[facility] - Left Message  

## 2017-07-29 ENCOUNTER — Encounter

## 2017-07-29 ENCOUNTER — Encounter: Payer: Self-pay | Admitting: Neurology

## 2017-07-29 ENCOUNTER — Ambulatory Visit (INDEPENDENT_AMBULATORY_CARE_PROVIDER_SITE_OTHER): Payer: Commercial Managed Care - PPO | Admitting: Neurology

## 2017-07-29 DIAGNOSIS — Z5181 Encounter for therapeutic drug level monitoring: Secondary | ICD-10-CM

## 2017-07-29 DIAGNOSIS — G5711 Meralgia paresthetica, right lower limb: Secondary | ICD-10-CM

## 2017-07-29 DIAGNOSIS — M5431 Sciatica, right side: Secondary | ICD-10-CM

## 2017-07-29 NOTE — Procedures (Signed)
     HISTORY:  [name] is a 62 year old patient with a history of significant discomfort on the anterolateral aspect of the right thigh.  She denies any low back pain.  The patient is being evaluated for possible neuropathy or a radiculopathy.  NERVE CONDUCTION STUDIES:  Nerve conduction studies were performed on both lower extremities.  The distal motor latencies for the peroneal nerves were normal on the right and prolonged on the left with a low motor amplitude on the left and normal on the right.  The distal motor latencies for the posterior tibial nerves were normal bilaterally with low motor amplitudes for these nerves bilaterally.  There is some slowing of the above and below the fibular head for the left peroneal nerve, normal on the right and normal nerve conduction velocities for the posterior tibial nerves bilaterally.  The sural and peroneal sensory latencies are within normal limits bilaterally.  The study of the right lateral femoral cutaneous nerve was unobtainable, the latency was obtainable and normal on the left.  The F-wave latencies for the posterior tibial nerves were normal bilaterally.  EMG STUDIES:  EMG study was performed on the right lower extremity:  The tibialis anterior muscle reveals 2 to 4K motor units with full recruitment. No fibrillations or positive waves were seen. The peroneus tertius muscle reveals 2 to 4K motor units with full recruitment. No fibrillations or positive waves were seen. The medial gastrocnemius muscle reveals 1 to 3K motor units with full recruitment. No fibrillations or positive waves were seen. The vastus lateralis muscle reveals 2 to 4K motor units with full recruitment. No fibrillations or positive waves were seen. The iliopsoas muscle reveals 2 to 4K motor units with full recruitment. No fibrillations or positive waves were seen. The biceps femoris muscle (long head) reveals 2 to 4K motor units with full recruitment. No fibrillations  or positive waves were seen. The lumbosacral paraspinal muscles were tested at 3 levels, and revealed no abnormalities of insertional activity at all 3 levels tested. There was good relaxation.   IMPRESSION:  Nerve conduction studies done on both lower extremities shows some lowering of motor amplitudes of both lower extremities, there appears to be some distal dysfunction involving the left peroneal nerve.  There is absence of the right lateral femoral cutaneous nerve latency suggestive of a meralgia paresthetica on the right.  EMG evaluation of the right lower extremity was completely normal.  There is no evidence of an overlying lumbosacral radiculopathy.  Jill Alexanders MD 07/29/2017 1:57 PM  [facility] [address] [place], [place] [address]  Phone [phone] Fax [phone]

## 2017-07-29 NOTE — Progress Notes (Signed)
Please refer to EMG and nerve conduction study procedure note. 

## 2017-07-29 NOTE — Progress Notes (Addendum)
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  EMG of the right leg is normal.  The patient will have blood work done today to check carbamazepine levels, if we can go higher on the dose we may do this in the future.  The patient has gained some benefit with the use of carbamazepine.    [place]    Nerve / Sites Muscle Latency Ref. Amplitude Ref. Rel Amp Segments Distance Velocity Ref. Area    ms ms mV mV %  cm m/s m/s mVms  R Peroneal - EDB     Ankle EDB 4.3 ?6.5 5.2 ?2.0 100 Ankle - EDB 9   18.1     Fib head EDB 9.9  5.0  95.9 Fib head - Ankle 28 50 ?44 19.1     Pop fossa EDB 12.0  4.8  96 Pop fossa - Fib head 10 48 ?44 18.6         Pop fossa - Ankle      L Peroneal - EDB     Ankle EDB 7.0 ?6.5 1.8 ?2.0 100 Ankle - EDB 9   7.9     Fib head EDB 13.5  1.8  96.6 Fib head - Ankle 28 43 ?44 10.0     Pop fossa EDB 15.9  1.5  87.6 Pop fossa - Fib head 10 42 ?44 8.4         Pop fossa - Ankle      R Tibial - AH     Ankle AH 5.0 ?5.8 2.0 ?4.0 100 Ankle - AH 9   7.2     Pop fossa AH 13.0  1.6  77.1 Pop fossa - Ankle 36 45 ?41 4.9  L Tibial - AH     Ankle AH 5.5 ?5.8 2.4 ?4.0 100 Ankle - AH 9   9.1     Pop fossa AH 13.3  1.7  69.3 Pop fossa - Ankle 36 46 ?41 7.3             [facility]    Nerve / Sites Rec. Site Peak Lat Ref.  Amp Ref. Segments Distance    ms ms V V  cm  R Sural - Ankle (Calf)     Calf Ankle 3.9 ?4.4 8 ?6 Calf - Ankle 14  L Sural - Ankle (Calf)     Calf Ankle 3.3 ?4.4 6 ?6 Calf - Ankle 14  R Superficial peroneal - Ankle     Lat leg Ankle 3.6 ?4.4 6 ?6 Lat leg - Ankle 14  L Superficial peroneal - Ankle     Lat leg Ankle 3.7 ?4.4 6 ?6 Lat leg - Ankle 14  R Lateral femoral cutaneous - Thigh (Inguinal ligament)     A. Ing ligament Thigh NR  NR  A. Ing ligament - Thigh   L Lateral femoral cutaneous - Thigh (Inguinal ligament)     A. Ing ligament Thigh 5.1  4  A. Ing ligament - Thigh                   F  Wave    Nerve F Lat Ref.   ms ms  R Tibial - AH 51.8 ?56.0  L Tibial - AH 53.5 ?56.0

## 2017-07-30 ENCOUNTER — Other Ambulatory Visit: Payer: Self-pay | Admitting: Family Medicine

## 2017-07-30 ENCOUNTER — Telehealth: Payer: Self-pay | Admitting: Neurology

## 2017-07-30 DIAGNOSIS — M792 Neuralgia and neuritis, unspecified: Secondary | ICD-10-CM

## 2017-07-30 LAB — COMPREHENSIVE METABOLIC PANEL
ALK PHOS: 94 IU/L (ref 39–117)
ALT: 13 IU/L (ref 0–32)
AST: 18 IU/L (ref 0–40)
Albumin/Globulin Ratio: 1.7 (ref 1.2–2.2)
Albumin: 4.3 g/dL (ref 3.6–4.8)
BUN/Creatinine Ratio: 14 (ref 12–28)
BUN: 9 mg/dL (ref 8–27)
Bilirubin Total: <0.2 mg/dL (ref 0.0–1.2)
CHLORIDE: 103 mmol/L (ref 96–106)
CO2: 23 mmol/L (ref 20–29)
CREATININE: 0.66 mg/dL (ref 0.57–1.00)
Calcium: 9.1 mg/dL (ref 8.7–10.3)
GFR calc Af Amer: 110 mL/min/{1.73_m2} (ref >59)
GFR calc non Af Amer: 96 mL/min/{1.73_m2} (ref >59)
GLOBULIN, TOTAL: 2.5 g/dL (ref 1.5–4.5)
GLUCOSE: 84 mg/dL (ref 65–99)
Potassium: 3.9 mmol/L (ref 3.5–5.2)
SODIUM: 141 mmol/L (ref 134–144)
Total Protein: 6.8 g/dL (ref 6.0–8.5)

## 2017-07-30 LAB — CBC WITH DIFFERENTIAL/PLATELET
BASOS ABS: 0 10*3/uL (ref 0.0–0.2)
Basos: 0 %
EOS (ABSOLUTE): 0.3 10*3/uL (ref 0.0–0.4)
EOS: 2 %
HEMATOCRIT: 36.6 % (ref 34.0–46.6)
Hemoglobin: 12 g/dL (ref 11.1–15.9)
Immature Grans (Abs): 0 10*3/uL (ref 0.0–0.1)
Immature Granulocytes: 0 %
Lymphocytes Absolute: 1.5 10*3/uL (ref 0.7–3.1)
Lymphs: 14 %
MCH: 28.4 pg (ref 26.6–33.0)
MCHC: 32.8 g/dL (ref 31.5–35.7)
MCV: 87 fL (ref 79–97)
MONOS ABS: 0.5 10*3/uL (ref 0.1–0.9)
Monocytes: 5 %
NEUTROS PCT: 79 %
Neutrophils Absolute: 8.6 10*3/uL — ABNORMAL HIGH (ref 1.4–7.0)
PLATELETS: 312 10*3/uL (ref 150–450)
RBC: 4.22 x10E6/uL (ref 3.77–5.28)
RDW: 15.4 % (ref 12.3–15.4)
WBC: 10.9 10*3/uL — AB (ref 3.4–10.8)

## 2017-07-30 LAB — CARBAMAZEPINE LEVEL, TOTAL: CARBAMAZEPINE LVL: 7.8 ug/mL (ref 4.0–12.0)

## 2017-07-30 MED ORDER — CARBAMAZEPINE 200 MG PO TABS: ORAL_TABLET | ORAL | 1 refills | Status: DC

## 2017-07-30 NOTE — Telephone Encounter (Signed)
I called the patient, the blood work shows a Tegretol level in the therapeutic range, could potentially go up on the dose to a 300 mg twice daily regimen, likely cannot go much higher than that.  I will call in a medication prescription to accommodate this dose increase.

## 2017-08-07 NOTE — Telephone Encounter (Signed)
Error in charting.

## 2017-08-12 ENCOUNTER — Encounter: Payer: Commercial Managed Care - PPO | Attending: Family Medicine | Admitting: Nutrition

## 2017-08-12 ENCOUNTER — Encounter

## 2017-08-12 VITALS — Ht 62.0 in | Wt 173.0 lb

## 2017-08-12 DIAGNOSIS — E785 Hyperlipidemia, unspecified: Secondary | ICD-10-CM | POA: Insufficient documentation

## 2017-08-12 DIAGNOSIS — E669 Obesity, unspecified: Secondary | ICD-10-CM | POA: Diagnosis not present

## 2017-08-12 DIAGNOSIS — Z713 Dietary counseling and surveillance: Secondary | ICD-10-CM | POA: Insufficient documentation

## 2017-08-12 DIAGNOSIS — I10 Essential (primary) hypertension: Secondary | ICD-10-CM | POA: Insufficient documentation

## 2017-08-12 NOTE — Progress Notes (Signed)
  Medical Nutrition Therapy:  Appt start time: 1330 end time:  1430.   Assessment:  Primary concerns today: Overweight and Hyperlipidemia.. Lives with her husband. She cares for her mom and her uncle. She wants to lose weight down to 165 lbs.  Has lost about 12 lbs in the last month. Has been cutting down on sugar and sweets. Struggles to find time to eat balanced meals. Eats late at night. Eats 2-3 meals per day. Skips meals at times. Sleeps in due to staying up late caring for MOm and Uncle. Motivated to make changes with diet and exercise to prevent diabetes and lose weight long term. Willing to reduce fat and cholesterol to lower lipid levels.  Preferred Learning Style:  No preference indicated   Learning Readiness:   Ready  Change in progress   MEDICATIONS: see list   DIETARY INTAKE:   24-hr recall:  B (9-11 AM): coffee  2 toast, margerine and jelly and cappachino Snk ( AM): water L ( PM): saltines, 6 Snk ( PM): watermelon  1-2 cups D ( PM): Homemade vegetable soup 2 cups, 12, Diet sods Snk ( PM):  Beverages: water, diet sodas  Usual physical activity: ADL  Estimated energy needs: 1500  calories 170 g carbohydrates 112 g protein 42 g fat  Progress Towards Goal(s):  In progress.   Nutritional Diagnosis:  NB-1.1 Food and nutrition-related knowledge deficit As related to Obesity.  As evidenced by BMI 31.    Intervention:  Nutrition and [place] CHolesterol/Weight loss  education provided on My Plate, CHO counting, meal planning, portion sizes, timing of meals, avoiding snacks between meals taking medications as prescribed, benefits of exercising 30 minutes per day and prevention of complications of DM. HIgh Fiber low fat diet.  Goals 1. Follow My Plate 2. Eat meals on time 3. Cut out junk food and sweets 4. Increase fresh fruits and vegetables Get sectional plate Lose 1 lbs per week  Teaching Method Utilized:  Visual Auditory Hands on  Handouts given during  visit include:  The Plate Method   Meal Plan Card  Barriers to learning/adherence to lifestyle change:  none  Demonstrated degree of understanding via:  Teach Back   Monitoring/Evaluation:  Dietary intake, exercise, meal plannign, and body weight in 1 month(s).

## 2017-08-12 NOTE — Patient Instructions (Signed)
Goals 1. Follow My Plate 2. Eat meals on time 3. Cut out junk food and sweets 4. Increase fresh fruits and vegetables Get sectional plate Lose 1 lbs per week

## 2017-08-13 ENCOUNTER — Encounter: Payer: Self-pay | Admitting: Family Medicine

## 2017-08-13 ENCOUNTER — Ambulatory Visit (INDEPENDENT_AMBULATORY_CARE_PROVIDER_SITE_OTHER): Payer: Commercial Managed Care - PPO | Admitting: Family Medicine

## 2017-08-13 ENCOUNTER — Other Ambulatory Visit: Payer: Self-pay

## 2017-08-13 ENCOUNTER — Encounter: Payer: Self-pay | Admitting: Nutrition

## 2017-08-13 VITALS — BP 138/82 | HR 68 | Temp 98.4°F | Resp 18 | Ht 62.0 in | Wt 181.1 lb

## 2017-08-13 DIAGNOSIS — Z23 Encounter for immunization: Secondary | ICD-10-CM

## 2017-08-13 DIAGNOSIS — E785 Hyperlipidemia, unspecified: Secondary | ICD-10-CM

## 2017-08-13 DIAGNOSIS — Z113 Encounter for screening for infections with a predominantly sexual mode of transmission: Secondary | ICD-10-CM

## 2017-08-13 DIAGNOSIS — I10 Essential (primary) hypertension: Secondary | ICD-10-CM

## 2017-08-13 DIAGNOSIS — L659 Nonscarring hair loss, unspecified: Secondary | ICD-10-CM

## 2017-08-13 MED ORDER — BIOTIN 5 MG PO TABS: ORAL_TABLET | ORAL | 0 refills | Status: AC

## 2017-08-13 NOTE — Progress Notes (Signed)
Patient ID: [name], female    DOB: 04/25/1955, 62 y.o.   MRN: [id number]  Chief Complaint  Patient presents with  . Hypertension    follow up  . Hyperlipidemia    started medication    Allergies Patient has no known allergies.  Subjective:   [name] is a 62 y.o. female who presents to [facility] today.  HPI Here for a follow up. Has started the lipitor and is tolerating it without problems. No myalgias. No side effects. Taking lipitor 20 mg po qhs. Reports that her cholesterol has gone up b/c she had not been following her diet.   Would like to get an HIV test. Would like to get a shingles vaccine.  Has been taking her BP medications. No CP or SOB. Feels good. Still having chronic pain in left leg and being seen by neurology. Would like to have a handicap permit to use on days when the pain is bad. Some days are better than other days.  Patient reports that she feels like her hair is thinner than it used to be.  Would like to take a vitamin for hair growth.  Does not have any patchy hair loss.  No rashes on her scalp.    Past Medical History:  Diagnosis Date  . Anxiety   . Depression   . GERD (gastroesophageal reflux disease)   . Hypertension   . Meralgia paresthetica of right side 06/05/2017    Past Surgical History:  Procedure Laterality Date  . KNEE CARTILAGE SURGERY Right    Workers Compensation, fell out of chair at work.     Family History  Problem Relation Age of Onset  . Alzheimer's disease Mother   . Hypertension Sister   . Diabetes Maternal Uncle   . Hypertension Sister      Social History   Socioeconomic History  . Marital status: Married    Spouse name: Not on file  . Number of children: Not on file  . Years of education: Not on file  . Highest education level: Not on file  Occupational History  . Not on file  Social Needs  . Financial resource strain: Not on file  . Food insecurity:    Worry: Not on file   Inability: Not on file  . Transportation needs:    Medical: Not on file    Non-medical: Not on file  Tobacco Use  . Smoking status: Former Research scientist (life sciences)  . Smokeless tobacco: Never Used  Substance and Sexual Activity  . Alcohol use: No  . Drug use: No  . Sexual activity: Yes    Birth control/protection: Post-menopausal  Lifestyle  . Physical activity:    Days per week: Not on file    Minutes per session: Not on file  . Stress: Not on file  Relationships  . Social connections:    Talks on phone: Not on file    Gets together: Not on file    Attends religious service: Not on file    Active member of club or organization: Not on file    Attends meetings of clubs or organizations: Not on file    Relationship status: Not on file  Other Topics Concern  . Not on file  Social History Narrative   Lives   Caffeine use:    Married. 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    Spends every 3rd night at [employer].    Has been taking care of mother since  2015.    Current Outpatient Medications on File Prior to Visit  Medication Sig Dispense Refill  . aspirin EC 81 MG tablet Take 81 mg by mouth daily.    Marland Kitchen atorvastatin (LIPITOR) 20 MG tablet Take 1 tablet (20 mg total) by mouth daily. 90 tablet 3  . carbamazepine (TEGRETOL) 200 MG tablet 1.5 tablets twice a day 270 tablet 1  . chlorhexidine (PERIDEX) 0.12 % solution 5 mLs by Mouth Rinse route 2 (two) times daily.    . Cholecalciferol (D3-1000) 1000 units capsule Take 4,000 Units by mouth daily.    . clindamycin (CLEOCIN) 300 MG capsule Take 300 mg by mouth 3 (three) times daily.  0  . gabapentin (NEURONTIN) 300 MG capsule TAKE TWO (2) CAPSULES BY MOUTH THREE TIMES DAILY. 180 capsule 0  . hydrochlorothiazide (HYDRODIURIL) 25 MG tablet TAKE ONE TABLET BY MOUTH DAILY. 30 tablet 2  . metoprolol succinate (TOPROL-XL) 50 MG 24 hr tablet Take 1 tablet (50 mg total) by mouth daily. Take with or immediately following a meal. 90 tablet 3  .  Multiple Vitamin (MULTIVITAMIN) capsule Take 1 capsule by mouth daily.    . sertraline (ZOLOFT) 100 MG tablet Take 2 tablets (200 mg total) by mouth daily. 60 tablet 3  . temazepam (RESTORIL) 7.5 MG capsule TAKE ONE CAPSULE BY MOUTH AT BEDTIME AS NEEDED FOR SLEEP 30 capsule 1  . traZODone (DESYREL) 100 MG tablet TAKE TWO (2) TABLETS BY MOUTH EVERY NIGHT AS DIRECTED 120 tablet 0   No current facility-administered medications on file prior to visit.     Review of Systems  Constitutional: Negative for activity change, appetite change and fever.  Eyes: Negative for visual disturbance.  Respiratory: Negative for cough, chest tightness and shortness of breath.   Cardiovascular: Negative for chest pain, palpitations and leg swelling.  Gastrointestinal: Negative for abdominal pain, nausea and vomiting.  Genitourinary: Negative for dysuria, frequency and urgency.  Musculoskeletal: Negative for myalgias.  Neurological: Negative for dizziness, syncope and light-headedness.  Hematological: Negative for adenopathy.  Psychiatric/Behavioral: Negative for dysphoric mood. The patient is not nervous/anxious.      Objective:   BP 138/82 (BP Location: Left Arm, Patient Position: Sitting, Cuff Size: Large)   Pulse 68   Temp 98.4 F (36.9 C) (Temporal)   Resp 18   Ht 5\' 2"  (1.575 m)   Wt 181 lb 1.9 oz (82.2 kg)   SpO2 96%   BMI 33.13 kg/m   Physical Exam  Constitutional: She is oriented to person, place, and time. She appears well-developed and well-nourished. No distress.  HENT:  Head: Normocephalic and atraumatic.  Eyes: Pupils are equal, round, and reactive to light.  Neck: Normal range of motion. Neck supple. No thyromegaly present.  Cardiovascular: Normal rate, regular rhythm and normal heart sounds.  Pulmonary/Chest: Effort normal and breath sounds normal. No respiratory distress.  Neurological: She is alert and oriented to person, place, and time. No cranial nerve deficit.  Skin: Skin is  warm and dry.  Thin appearing hair.  Tightly pulled back.  No obvious patchy hair loss or rashes on scalp.  Nursing note and vitals reviewed.    Assessment and Plan  1. Hyperlipidemia LDL goal <100 Return to clinic for check of lipid profile and hepatic function in August 2019. Continue visits with dietitian. Diet, exercise, and weight loss modifications recommended. Dietary modifications reviewed with patient today.  Will increase statin medication if needed after check to get LDL goal less than 100. 2. HTN, goal  below 140/90 Any current medication.  Blood pressure well controlled. Lifestyle modifications discussed with patient including a diet emphasizing vegetables, fruits, and whole grains. Limiting intake of sodium to less than 2,400 mg per day.  Recommendations discussed include consuming low-fat dairy products, poultry, fish, legumes, non-tropical vegetable oils, and nuts; and limiting intake of sweets, sugar-sweetened beverages, and red meat. Discussed following a plan such as the Dietary Approaches to Stop Hypertension (DASH) diet. Patient to read up on this diet.   3. Screen for STD (sexually transmitted disease) She requests screening.  Defers vaginal testing of any sexually transmitted infection. - HIV antibody - RPR - Hepatitis panel, acute  4. Hair loss Will call and find out if there is a dermatologist that deals with African-American hair and hair loss.  Will contact patient. - Biotin 5 MG TABS; Use one po qd; Refill: 0 -Traction alopecia discussed with patient. 5. Need for shingles vaccine  Return to clinic for next shot as directed. - Varicella-zoster vaccine IM   Return in about 3 months (around 11/13/2017). Caren Macadam, MD 08/13/2017

## 2017-08-13 NOTE — Patient Instructions (Addendum)
Biotin -vitamin for hair, skin and nails.   I will check on the dermatology for hair questions.   If you do not hear back from Ava, call me Follow up with dietician Recheck cholesterol at lab, fasting, at the beginning of August.

## 2017-08-14 ENCOUNTER — Telehealth: Payer: Self-pay

## 2017-08-14 DIAGNOSIS — F339 Major depressive disorder, recurrent, unspecified: Secondary | ICD-10-CM

## 2017-08-14 LAB — HEPATITIS PANEL, ACUTE
HEP B S AG: NONREACTIVE
HEP C AB: NONREACTIVE
Hep A IgM: NONREACTIVE
Hep B C IgM: NONREACTIVE
SIGNAL TO CUT-OFF: 0.01 (ref <1.00)

## 2017-08-14 LAB — RPR: RPR Ser Ql: NONREACTIVE

## 2017-08-14 LAB — HIV ANTIBODY (ROUTINE TESTING W REFLEX): HIV: NONREACTIVE

## 2017-08-14 NOTE — BH Specialist Note (Signed)
[place] Telephone Follow-up  MRN: [id number] NAME: [name] Date: 08/14/17  Total time: 20 minutes Call number: 2/6  Reason for call today: Reason for Contact: PHQ9-2 weeks  PHQ-9 Scores:  Depression screen [facility] 2/9 08/13/2017 08/12/2017 05/28/2017 05/14/2017 03/13/2017  Decreased Interest 1 3 1  0 0  Down, Depressed, Hopeless 1 3 1 3  0  PHQ - 2 Score 2 6 2 3  0  Altered sleeping 0 3 0 0 -  Tired, decreased energy 1 3 0 0 -  Change in appetite 1 3 0 0 -  Feeling bad or failure about yourself  1 1 1 1  -  Trouble concentrating 0 1 0 0 -  Moving slowly or fidgety/restless 0 0 0 0 -  Suicidal thoughts 0 0 0 0 -  PHQ-9 Score 5 17 3 4  -  Difficult doing work/chores Somewhat difficult Very difficult Not difficult at all Not difficult at all -   GAD-7 Scores:  GAD 7 : Generalized Anxiety Score 05/28/2017  Nervous, Anxious, on Edge 3  Control/stop worrying 2  Worry too much - different things 3  Trouble relaxing 2  Restless 1  Easily annoyed or irritable 0  Afraid - awful might happen 3  Total GAD 7 Score 14    Stress Current stressors: Current Stressors: (Caring for her mother wtih dementia who is also total care.  Strained relationship with her husband.  Caring for her Uncle who has declining health.) Sleep: Sleep: No problems Appetite: Appetite: No problems Coping ability: Coping ability: Normal Patient taking medications as prescribed: Patient taking medications as prescribed: Yes  Current medications:  Outpatient Encounter Medications as of 08/14/2017  Medication Sig  . aspirin EC 81 MG tablet Take 81 mg by mouth daily.  Marland Kitchen atorvastatin (LIPITOR) 20 MG tablet Take 1 tablet (20 mg total) by mouth daily.  . Biotin 5 MG TABS Use one po qd  . carbamazepine (TEGRETOL) 200 MG tablet 1.5 tablets twice a day  . chlorhexidine (PERIDEX) 0.12 % solution 5 mLs by Mouth Rinse route 2 (two) times daily.  . Cholecalciferol (D3-1000) 1000 units capsule Take 4,000 Units by mouth  daily.  . clindamycin (CLEOCIN) 300 MG capsule Take 300 mg by mouth 3 (three) times daily.  Marland Kitchen gabapentin (NEURONTIN) 300 MG capsule TAKE TWO (2) CAPSULES BY MOUTH THREE TIMES DAILY.  . hydrochlorothiazide (HYDRODIURIL) 25 MG tablet TAKE ONE TABLET BY MOUTH DAILY.  . metoprolol succinate (TOPROL-XL) 50 MG 24 hr tablet Take 1 tablet (50 mg total) by mouth daily. Take with or immediately following a meal.  . Multiple Vitamin (MULTIVITAMIN) capsule Take 1 capsule by mouth daily.  . sertraline (ZOLOFT) 100 MG tablet Take 2 tablets (200 mg total) by mouth daily.  . temazepam (RESTORIL) 7.5 MG capsule TAKE ONE CAPSULE BY MOUTH AT BEDTIME AS NEEDED FOR SLEEP  . traZODone (DESYREL) 100 MG tablet TAKE TWO (2) TABLETS BY MOUTH EVERY NIGHT AS DIRECTED   No facility-administered encounter medications on file as of 08/14/2017.      Self-harm Behaviors Risk Assessment Self-harm risk factors: Self-harm risk factors: (N/A) Patient endorses recent thoughts of harming self: Have you recently had any thoughts about harming yourself?: No  [place] Suicide Severity Rating Scale: No flowsheet data found. No flowsheet data found.   Danger to Others Risk Assessment Danger to others risk factors: Danger to Others Risk Factors: No risk factors noted Patient endorses recent thoughts of harming others: Notification required: No need or identified person  Dynamic  Appraisal of Situational Aggression (DASA): No flowsheet data found.   Substance Use Assessment Patient recently consumed alcohol:    Alcohol Use Disorder Identification Test (AUDIT): No flowsheet data found. Patient recently used drugs:    Opioid Risk Assessment:    Goals, Interventions and Follow-up Plan Goals: Increase healthy adjustment to current life circumstances Interventions: Motivational Interviewing, Solution-Focused Strategies, Mindfulness or Psychologist, educational, Behavioral Activation, Brief CBT and Supportive Counseling Follow-up Plan:  [facility] Phone Follow  Up   Summary:     Rene Paci, LCAS-A

## 2017-08-20 ENCOUNTER — Ambulatory Visit: Payer: Commercial Managed Care - PPO | Admitting: Family Medicine

## 2017-09-01 ENCOUNTER — Other Ambulatory Visit: Payer: Self-pay | Admitting: Family Medicine

## 2017-09-01 DIAGNOSIS — F5105 Insomnia due to other mental disorder: Secondary | ICD-10-CM

## 2017-09-01 DIAGNOSIS — F99 Mental disorder, not otherwise specified: Principal | ICD-10-CM

## 2017-09-11 DIAGNOSIS — Z0271 Encounter for disability determination: Secondary | ICD-10-CM

## 2017-09-25 ENCOUNTER — Telehealth: Payer: Self-pay

## 2017-09-25 DIAGNOSIS — F419 Anxiety disorder, unspecified: Secondary | ICD-10-CM

## 2017-09-25 DIAGNOSIS — F339 Major depressive disorder, recurrent, unspecified: Secondary | ICD-10-CM

## 2017-09-25 NOTE — BH Specialist Note (Signed)
[place] Telephone Follow-up  MRN: [id number] NAME: [name] Date: 19-Oct-2017   Total time: 30 minutes Call number: 3/6  Reason for call today: Reason for Contact: PHQ9-4 weeks  PHQ-9 Scores:  Depression screen [facility] 2/9 10-19-2017 08/13/2017 08/12/2017 05/28/2017 05/14/2017  Decreased Interest 3 1 3 1  0  Down, Depressed, Hopeless 3 1 3 1 3   PHQ - 2 Score 6 2 6 2 3   Altered sleeping 3 0 3 0 0  Tired, decreased energy 2 1 3  0 0  Change in appetite 1 1 3  0 0  Feeling bad or failure about yourself  3 1 1 1 1   Trouble concentrating 1 0 1 0 0  Moving slowly or fidgety/restless 0 0 0 0 0  Suicidal thoughts 0 0 0 0 0  PHQ-9 Score 16 5 17 3 4   Difficult doing work/chores Very difficult Somewhat difficult Very difficult Not difficult at all Not difficult at all    GAD-7 Scores:  GAD 7 : Generalized Anxiety Score Oct 19, 2017 05/28/2017  Nervous, Anxious, on Edge 2 3  Control/stop worrying 3 2  Worry too much - different things 3 3  Trouble relaxing 2 2  Restless 2 1  Easily annoyed or irritable 2 0  Afraid - awful might happen 3 3  Total GAD 7 Score 17 14  Anxiety Difficulty Very difficult -    Stress Current stressors: Current Stressors: (Mother in the hospital to have her foot removedddue to gangeriane. ) Sleep: Sleep: Difficulty falling asleep, Difficulty staying asleep Appetite: Appetite: Decreased, Loss of appetite Coping ability: Coping ability: Exhausted, Overwhelmed  Patient taking medications as prescribed: Patient taking medications as prescribed: Yes  Current medications:  Outpatient Encounter Medications as of 2017/10/19  Medication Sig  . aspirin EC 81 MG tablet Take 81 mg by mouth daily.  Marland Kitchen atorvastatin (LIPITOR) 20 MG tablet Take 1 tablet (20 mg total) by mouth daily.  . Biotin 5 MG TABS Use one po qd  . carbamazepine (TEGRETOL) 200 MG tablet 1.5 tablets twice a day  . chlorhexidine (PERIDEX) 0.12 % solution 5 mLs by Mouth Rinse route 2 (two) times daily.   . Cholecalciferol (D3-1000) 1000 units capsule Take 4,000 Units by mouth daily.  . clindamycin (CLEOCIN) 300 MG capsule Take 300 mg by mouth 3 (three) times daily.  Marland Kitchen gabapentin (NEURONTIN) 300 MG capsule TAKE TWO (2) CAPSULES BY MOUTH THREE TIMES DAILY.  . hydrochlorothiazide (HYDRODIURIL) 25 MG tablet TAKE ONE TABLET BY MOUTH DAILY.  . metoprolol succinate (TOPROL-XL) 50 MG 24 hr tablet Take 1 tablet (50 mg total) by mouth daily. Take with or immediately following a meal.  . Multiple Vitamin (MULTIVITAMIN) capsule Take 1 capsule by mouth daily.  . sertraline (ZOLOFT) 100 MG tablet Take 2 tablets (200 mg total) by mouth daily.  . temazepam (RESTORIL) 7.5 MG capsule TAKE ONE CAPSULE BY MOUTH AT BEDTIME AS NEEDED FOR SLEEP  . traZODone (DESYREL) 100 MG tablet TAKE TWO (2) TABLETS BY MOUTH EVERY NIGHT AS DIRECTED   No facility-administered encounter medications on file as of Oct 19, 2017.      Self-harm Behaviors Risk Assessment Self-harm risk factors: Self-harm risk factors: ([place] Reported) Patient endorses recent thoughts of harming self: Have you recently had any thoughts about harming yourself?: No  [place] Suicide Severity Rating Scale: No flowsheet data found. C-SRSS 10/19/17  1. Wish to be Dead No  2. Suicidal Thoughts No  6. Suicide Behavior Question No     Danger to Others Risk  Assessment Danger to others risk factors: Danger to Others Risk Factors: No risk factors noted Patient endorses recent thoughts of harming others: Notification required: No need or identified person     Substance Use Assessment Patient recently consumed alcohol: Have you recently consumed alcohol?: No  Alcohol Use Disorder Identification Test (AUDIT):  Alcohol Use Disorder Test (AUDIT) 09/25/2017  1. How often do you have a drink containing alcohol? 0  2. How many drinks containing alcohol do you have on a typical day when you are drinking? 0  3. How often do you have six or more drinks on one  occasion? 0  AUDIT-C Score 0   Patient recently used drugs: Have you recently used any drugs?: No    Goals, Interventions and Follow-up Plan Goals: Increase healthy adjustment to current life circumstances Interventions: Mindfulness or Relaxation Training, Behavioral Activation, Supportive Counseling and Medication Monitoring Follow-up Plan: [facility] Phone Follow Up   Summary:   Patient reports increased depression as evidenced by an increased in her PHQ and GAD score.    Stressors: Patient has been caring for her sick elderly mother  Patient was tearful during the session  Her mother's foot was removed and she is still in the hospital recovering She has been havng trouble sleeping, Reports having very poor quality of sleeep. Her husband is worried that she is not abe to sleep   Patient reports that she is aware that she is not taking care of herself    Writer discussed sleep hygiene.  Write discussed times that she is able to leave the hospital and take time for herself.    During the next session the patient will discuss how she taken steps to care for herself and allowed other family members to assist her with caring for her mother.    Graciella Freer [place], LCAS-A

## 2017-09-25 NOTE — Telephone Encounter (Signed)
[facility] - Patient was feeing her mother and wass not able to speak to me.  Provided patient with the crisis phone number to call me back.

## 2017-10-01 ENCOUNTER — Other Ambulatory Visit: Payer: Self-pay | Admitting: Family Medicine

## 2017-10-01 DIAGNOSIS — F99 Mental disorder, not otherwise specified: Principal | ICD-10-CM

## 2017-10-01 DIAGNOSIS — F5105 Insomnia due to other mental disorder: Secondary | ICD-10-CM

## 2017-10-02 ENCOUNTER — Telehealth: Payer: Self-pay

## 2017-10-02 NOTE — Telephone Encounter (Signed)
[facility] - Writer consulted with Dr Dwyane Dee regarding the patient.   Per Dr. Dwyane Dee - there are no medication recommendations for sleep medications.  Dr. Dwyane Dee stated that the [facility] specialists will focus on sleep hygiene techniques.    Marland Kitchen

## 2017-10-06 ENCOUNTER — Other Ambulatory Visit: Payer: Self-pay

## 2017-10-06 ENCOUNTER — Telehealth: Payer: Self-pay | Admitting: Family Medicine

## 2017-10-06 DIAGNOSIS — F99 Mental disorder, not otherwise specified: Principal | ICD-10-CM

## 2017-10-06 DIAGNOSIS — F5105 Insomnia due to other mental disorder: Secondary | ICD-10-CM

## 2017-10-06 MED ORDER — TEMAZEPAM 7.5 MG PO CAPS: 7.5000 mg | ORAL_CAPSULE | Freq: Every evening | ORAL | 0 refills | Status: DC | PRN

## 2017-10-06 MED ORDER — TRAZODONE HCL 100 MG PO TABS: ORAL_TABLET | ORAL | 0 refills | Status: DC

## 2017-10-06 NOTE — Telephone Encounter (Signed)
Patient calling to check the status of refill request from last Wednesday for   temazepam (RESTORIL) 7.5 MG capsule    traZODone (DESYREL) 100 MG tablet      She has no trazodone.  Cb# [phone]

## 2017-10-07 NOTE — Telephone Encounter (Signed)
Prescriptions refilled/faxed and patient notified with verbal understanding.

## 2017-10-13 ENCOUNTER — Ambulatory Visit: Payer: Commercial Managed Care - PPO | Admitting: Nutrition

## 2017-10-15 ENCOUNTER — Encounter: Payer: Self-pay | Admitting: Nutrition

## 2017-10-15 ENCOUNTER — Encounter: Payer: Commercial Managed Care - PPO | Attending: Family Medicine | Admitting: Nutrition

## 2017-10-15 VITALS — Ht 62.0 in | Wt 175.4 lb

## 2017-10-15 DIAGNOSIS — E782 Mixed hyperlipidemia: Secondary | ICD-10-CM

## 2017-10-15 DIAGNOSIS — E669 Obesity, unspecified: Secondary | ICD-10-CM

## 2017-10-15 NOTE — Progress Notes (Signed)
  Medical Nutrition Therapy:  Appt start time 1120  end time:  1145   Assessment:  Primary concerns today: Overweight and Hyperlipidemia..   She notes she is feeling very overwhelmed caring for her mom and uncle. Not able to do things for herself due to being focused on them and being very anxious/nervous about them all the time. Not on any anxiety or depression meds.Her mom had to have her  BKA left leg.  She notes she has  Phone therapist she talks to but thinks she would benefit from a face to face therapist and/or antianxiety meds like Celexa or similar. Doesn't want xanax.  Lost 6 lbs. Changes made: Trying to eat better balanced meals. Did buy some Healthy Choices meals at times. Is caring for her mom and uncle.  Sometimes skips meals because she has no energy to cook or shop for foods to cook. So, she ends up getting fast foods at times. Her mom had to have her  BKA left leg.   Preferred Learning Style:  No preference indicated   Learning Readiness:   Ready  Change in progress   MEDICATIONS: see list   DIETARY INTAKE:   24-hr recall:  B (9-11 AM): coffee  2 toast, margerine and jelly and cappachino Snk ( AM): water L ( PM): saltines, 6 Snk ( PM): watermelon  1-2 cups D ( PM): Homemade vegetable soup 2 cups, 12, Diet sods Snk ( PM):  Beverages: water, diet sodas  Usual physical activity: ADL  Estimated energy needs: 1500  calories 170 g carbohydrates 112 g protein 42 g fat  Progress Towards Goal(s):  In progress.   Nutritional Diagnosis:  NB-1.1 Food and nutrition-related knowledge deficit As related to Obesity.  As evidenced by BMI 31.    Intervention:  Nutrition and [place] CHolesterol/Weight loss  education provided on My Plate, CHO counting, meal planning, portion sizes, timing of meals, avoiding snacks between meals taking medications as prescribed, benefits of exercising 30 minutes per day and prevention of complications of DM. HIgh Fiber low fat  diet.  Goals  Eat three meals a day Increase fresh fruits and vegetables  Don't skip meals. Work on planning meals better. Increase high fiber foods.  Teaching Method Utilized:  Visual Auditory Hands on  Handouts given during visit include:  The Plate Method   Meal Plan Card  Barriers to learning/adherence to lifestyle change:  none  Demonstrated degree of understanding via:  Teach Back   Monitoring/Evaluation:  Dietary intake, exercise, meal plannign, and body weight in 3 month(s).

## 2017-10-15 NOTE — Patient Instructions (Addendum)
Goals  Eat three meals a day Increase fresh fruits and vegetables  Don't skip meals. Work on planning meals better. Increase high fiber foods.

## 2017-10-16 ENCOUNTER — Ambulatory Visit: Payer: Commercial Managed Care - PPO | Admitting: Nurse Practitioner

## 2017-10-17 ENCOUNTER — Ambulatory Visit (INDEPENDENT_AMBULATORY_CARE_PROVIDER_SITE_OTHER): Payer: Commercial Managed Care - PPO | Admitting: Neurology

## 2017-10-17 ENCOUNTER — Encounter: Payer: Self-pay | Admitting: Neurology

## 2017-10-17 ENCOUNTER — Other Ambulatory Visit: Payer: Self-pay

## 2017-10-17 VITALS — BP 121/78 | HR 65 | Ht 62.0 in | Wt 175.5 lb

## 2017-10-17 DIAGNOSIS — M5431 Sciatica, right side: Secondary | ICD-10-CM

## 2017-10-17 DIAGNOSIS — G5711 Meralgia paresthetica, right lower limb: Secondary | ICD-10-CM

## 2017-10-17 NOTE — Progress Notes (Signed)
Reason for visit: Right meralgia paresthetica  [name] is an 62 y.o. female  History of present illness:  [name] is a 62 year old right-handed black female with a history of meralgia paresthetica on the right side.  She currently is on a combination of carbamazepine and gabapentin with fairly good control of her discomfort.  She is sleeping fairly well at night.  She does have some right knee discomfort, she has had prior knee surgery.  The patient is not having excessive daytime drowsiness, or gait instability on the medication regimen.  She currently takes 600 mg 3 times daily gabapentin, 300 mg twice daily of carbamazepine.  She returns for an evaluation.  Past Medical History:  Diagnosis Date  . Anxiety   . Depression   . GERD (gastroesophageal reflux disease)   . Hypertension   . Meralgia paresthetica of right side 06/05/2017    Past Surgical History:  Procedure Laterality Date  . KNEE CARTILAGE SURGERY Right    Work[employer] Compensation, fell out of chair at work.     Family History  Problem Relation Age of Onset  . Alzheimer's disease Mother   . Hypertension Sister   . Diabetes Maternal Uncle   . Hypertension Sister     Social history:  reports that she has quit smoking. She has never used smokeless tobacco. She reports that she does not drink alcohol or use drugs.   No Known Allergies  Medications:  Prior to Admission medications   Medication Sig Start Date End Date Taking? Authorizing Provider  aspirin EC 81 MG tablet Take 81 mg by mouth daily.   Yes [provider]  atorvastatin (LIPITOR) 20 MG tablet Take 1 tablet (20 mg total) by mouth daily. 06/17/17  Yes Caren Macadam, MD  Biotin 5 MG TABS Use one po qd 08/13/17  Yes Hagler, Apolonio Schneiders, MD  carbamazepine (TEGRETOL) 200 MG tablet 1.5 tablets twice a day 07/30/17  Yes Kathrynn Ducking, MD  chlorhexidine (PERIDEX) 0.12 % solution 5 mLs by Mouth Rinse route 2 (two) times daily. 06/30/17  Yes [provider]  Cholecalciferol (D3-1000) 1000 units capsule Take 4,000 Units by mouth daily.   Yes [provider]  gabapentin (NEURONTIN) 300 MG capsule TAKE TWO (2) CAPSULES BY MOUTH THREE TIMES DAILY. 07/30/17  Yes Hagler, Apolonio Schneiders, MD  hydrochlorothiazide (HYDRODIURIL) 25 MG tablet TAKE ONE TABLET BY MOUTH DAILY. 06/26/17  Yes Hagler, Apolonio Schneiders, MD  metoprolol succinate (TOPROL-XL) 50 MG 24 hr tablet Take 1 tablet (50 mg total) by mouth daily. Take with or immediately following a meal. 01/29/17  Yes Hagler, Apolonio Schneiders, MD  Multiple Vitamin (MULTIVITAMIN) capsule Take 1 capsule by mouth daily.   Yes [provider]  sertraline (ZOLOFT) 100 MG tablet Take 2 tablets (200 mg total) by mouth daily. 01/29/17  Yes Hagler, Apolonio Schneiders, MD  temazepam (RESTORIL) 7.5 MG capsule Take 1 capsule (7.5 mg total) by mouth at bedtime as needed. for sleep 10/06/17  Yes Caren Macadam, MD  traZODone (DESYREL) 100 MG tablet Take 2 tablets by mouth every night as directed 10/06/17  Yes Hagler, Apolonio Schneiders, MD    ROS:  Out of a complete 14 system review of symptoms, the patient complains only of the following symptoms, and all other reviewed systems are negative.  Eye itching Right knee pain  Blood pressure 121/78, pulse 65, height 5\' 2"  (1.575 m), weight 175 lb 8 oz (79.6 kg), SpO2 95 %.  Physical Exam  General: The patient is alert and cooperative  at the time of the examination.  Skin: No significant peripheral edema is noted.   Neurologic Exam  Mental status: The patient is alert and oriented x 3 at the time of the examination. The patient has apparent normal recent and remote memory, with an apparently normal attention span and concentration ability.   Cranial nerves: Facial symmetry is present. Speech is normal, no aphasia or dysarthria is noted. Extraocular movements are full. Visual fields are full.  Motor: The patient has good strength in all 4 extremities.  Sensory examination: Soft touch  sensation is symmetric on the face, arms, and legs, with a slight decrease in soft touch sensation on the right anterolateral thigh.  Coordination: The patient has good finger-nose-finger and heel-to-shin bilaterally.  Gait and station: The patient has a normal gait. Tandem gait is normal. Romberg is negative. No drift is seen.  Reflexes: Deep tendon reflexes are symmetric, but are depressed.   Assessment/Plan:  1.  Right meralgia paresthetica  The patient is doing well currently on gabapentin and carbamazepine.  We will try to taper her off of the gabapentin to see if she can do well on monotherapy.  She will go to 600 mg twice daily for 2 weeks and then go to 300 mg twice daily and then stop the gabapentin if the pain does not worsen.  If the pain does worsen, she can go back on the medication.  She will follow-up in 6 months otherwise.  Jill Alexanders MD 10/17/2017 12:52 PM  [facility] [address] [place], [place] [address]  Phone [phone] Fax [phone]

## 2017-10-17 NOTE — Patient Instructions (Signed)
Reduce the gabapentin to 600 mg twice a day for 2 weeks, then go to 300 mg twice a day for 2 weeks, then stop. If the pain increases, you may go back on the medication.

## 2017-10-20 NOTE — Telephone Encounter (Signed)
[facility] - Left Msg 

## 2017-10-21 ENCOUNTER — Ambulatory Visit: Payer: Commercial Managed Care - PPO | Admitting: Family Medicine

## 2017-10-24 ENCOUNTER — Telehealth: Payer: Self-pay | Admitting: Family Medicine

## 2017-10-24 DIAGNOSIS — E785 Hyperlipidemia, unspecified: Secondary | ICD-10-CM

## 2017-10-24 DIAGNOSIS — R5383 Other fatigue: Secondary | ICD-10-CM

## 2017-10-24 NOTE — Telephone Encounter (Signed)
Patient is requesting labs to check her thyroid and her cholesterol for her next office visit. [facility] is her lab. Cb#: 336/  [id number]

## 2017-10-27 NOTE — Telephone Encounter (Signed)
Pt aware.

## 2017-10-27 NOTE — Telephone Encounter (Signed)
Advise that labs are ordered. Needs to be fasting.

## 2017-10-29 ENCOUNTER — Ambulatory Visit: Payer: Commercial Managed Care - PPO | Admitting: Family Medicine

## 2017-10-29 ENCOUNTER — Telehealth: Payer: Self-pay

## 2017-10-29 DIAGNOSIS — F419 Anxiety disorder, unspecified: Secondary | ICD-10-CM

## 2017-10-29 NOTE — BH Specialist Note (Signed)
[place] Telephone Follow-up  MRN: [id number] NAME: [name] Date: 11-24-17   Total time: 30 minutes Call number: 3/6  Reason for call today: Reason for Contact: PHQ9-4 weeks    PHQ-9 Scores:  Depression screen [facility] 2/9 2017/11/24 10/15/2017 09/25/2017 08/13/2017 08/12/2017  Decreased Interest 3 3 3 1 3   Down, Depressed, Hopeless 3 3 3 1 3   PHQ - 2 Score 6 6 6 2 6   Altered sleeping 1 3 3  0 3  Tired, decreased energy 2 3 2 1 3   Change in appetite 1 3 1 1 3   Feeling bad or failure about yourself  3 3 3 1 1   Trouble concentrating 1 1 1  0 1  Moving slowly or fidgety/restless 1 0 0 0 0  Suicidal thoughts 0 0 0 0 0  PHQ-9 Score 15 19 16 5 17   Difficult doing work/chores Very difficult Very difficult Very difficult Somewhat difficult Very difficult     GAD-7 Scores:  GAD 7 : Generalized Anxiety Score 24-Nov-2017 09/25/2017 05/28/2017  Nervous, Anxious, on Edge 1 2 3   Control/stop worrying 1 3 2   Worry too much - different things 3 3 3   Trouble relaxing 1 2 2   Restless 2 2 1   Easily annoyed or irritable 1 2 0  Afraid - awful might happen 3 3 3   Total GAD 7 Score 12 17 14   Anxiety Difficulty Somewhat difficult Very difficult -     Stress Current stressors: Current Stressors: (Mother being released from the hospital ) Sleep: Sleep: No problems Appetite: Appetite: No problems Coping ability: Coping ability: Exhausted, Overwhelmed  Patient taking medications as prescribed: Patient taking medications as prescribed: Yes  Current medications:  Outpatient Encounter Medications as of 11/24/17  Medication Sig  . aspirin EC 81 MG tablet Take 81 mg by mouth daily.  Marland Kitchen atorvastatin (LIPITOR) 20 MG tablet Take 1 tablet (20 mg total) by mouth daily.  . Biotin 5 MG TABS Use one po qd  . carbamazepine (TEGRETOL) 200 MG tablet 1.5 tablets twice a day  . chlorhexidine (PERIDEX) 0.12 % solution 5 mLs by Mouth Rinse route 2 (two) times daily.  . Cholecalciferol (D3-1000) 1000 units  capsule Take 4,000 Units by mouth daily.  Marland Kitchen gabapentin (NEURONTIN) 300 MG capsule TAKE TWO (2) CAPSULES BY MOUTH THREE TIMES DAILY.  . hydrochlorothiazide (HYDRODIURIL) 25 MG tablet TAKE ONE TABLET BY MOUTH DAILY.  . metoprolol succinate (TOPROL-XL) 50 MG 24 hr tablet Take 1 tablet (50 mg total) by mouth daily. Take with or immediately following a meal.  . Multiple Vitamin (MULTIVITAMIN) capsule Take 1 capsule by mouth daily.  . sertraline (ZOLOFT) 100 MG tablet Take 2 tablets (200 mg total) by mouth daily.  . temazepam (RESTORIL) 7.5 MG capsule Take 1 capsule (7.5 mg total) by mouth at bedtime as needed. for sleep  . traZODone (DESYREL) 100 MG tablet Take 2 tablets by mouth every night as directed   No facility-administered encounter medications on file as of 24-Nov-2017.      Self-harm Behaviors Risk Assessment Self-harm risk factors: Self-harm risk factors: (None Reported) Patient endorses recent thoughts of harming self: Have you recently had any thoughts about harming yourself?: No  [place] Suicide Severity Rating Scale: No flowsheet data found. C-SRSS 09/25/2017 11/24/2017  1. Wish to be Dead No No  2. Suicidal Thoughts No No  6. Suicide Behavior Question No No     Danger to Others Risk Assessment Danger to others risk factors: Danger to Others Risk  Factors: No risk factors noted Patient endorses recent thoughts of harming others: Notification required: No need or identified person    Substance Use Assessment Patient recently consumed alcohol:  No  Alcohol Use Disorder Identification Test (AUDIT):  Alcohol Use Disorder Test (AUDIT) 09/25/2017  1. How often do you have a drink containing alcohol? 0  2. How many drinks containing alcohol do you have on a typical day when you are drinking? 0  3. How often do you have six or more drinks on one occasion? 0  AUDIT-C Score 0   Patient recently used drugs:  No    Goals, Interventions and Follow-up Plan Goals: Increase healthy  adjustment to current life circumstances Interventions: Supportive Counseling Follow-up Plan: [facility] Phone Follow Up   Summary:   Follow Up - Patient reports that her mother has been released from the hospital.  Patient has a decrease in her PHQ and GAD score.   Patient reports that the medication is working good for her.  Writer informed the patient that since she will be going with Dr Mannie Stabile to her new practice [facility] services will not be provided.  Writer discussed other options for the patient to be linked to outpatient therapy with [facility].  Patient declined face to face therapy.    Patient denies SI/HI/Psychosis/Substance Abuse. If your symptoms worsen or you have thoughts of suicide/homicide, PLEASE SEEK IMMEDIATE MEDICAL ATTENTION.  You may always call:  National Suicide Hotline: (425)749-0374;   Crisis Line: (680)241-1332;  Crisis Recovery in [place]: 618-296-9061.  These are available 24 hours a day, 7 days a week.  Patient reports going for a 2 mile walk with her husband.  During the next session patient will go out and purchase some new walking shoes for herself.      Graciella Freer [place], LCAS-A

## 2017-10-31 ENCOUNTER — Encounter: Payer: Self-pay | Admitting: Family Medicine

## 2017-10-31 LAB — HEPATIC FUNCTION PANEL
AG RATIO: 1.3 (calc) (ref 1.0–2.5)
ALBUMIN MSPROF: 3.8 g/dL (ref 3.6–5.1)
ALT: 13 U/L (ref 6–29)
AST: 18 U/L (ref 10–35)
Alkaline phosphatase (APISO): 88 U/L (ref 33–130)
Bilirubin, Direct: 0.1 mg/dL (ref 0.0–0.2)
GLOBULIN: 3 g/dL (ref 1.9–3.7)
Indirect Bilirubin: 0.3 mg/dL (calc) (ref 0.2–1.2)
Total Bilirubin: 0.4 mg/dL (ref 0.2–1.2)
Total Protein: 6.8 g/dL (ref 6.1–8.1)

## 2017-10-31 LAB — LIPID PANEL
CHOL/HDL RATIO: 3.1 (calc) (ref <5.0)
CHOLESTEROL: 149 mg/dL (ref <200)
HDL: 48 mg/dL — ABNORMAL LOW (ref >50)
LDL Cholesterol (Calc): 85 mg/dL (calc)
Non-HDL Cholesterol (Calc): 101 mg/dL (calc) (ref <130)
Triglycerides: 73 mg/dL (ref <150)

## 2017-10-31 LAB — TSH: TSH: 0.98 mIU/L (ref 0.40–4.50)

## 2017-11-03 ENCOUNTER — Encounter: Payer: Self-pay | Admitting: Clinical

## 2017-11-05 ENCOUNTER — Ambulatory Visit (INDEPENDENT_AMBULATORY_CARE_PROVIDER_SITE_OTHER): Payer: Commercial Managed Care - PPO | Admitting: Family Medicine

## 2017-11-05 ENCOUNTER — Encounter: Payer: Self-pay | Admitting: Family Medicine

## 2017-11-05 ENCOUNTER — Other Ambulatory Visit: Payer: Self-pay

## 2017-11-05 VITALS — BP 140/82 | HR 74 | Temp 98.6°F | Resp 12 | Ht 62.0 in | Wt 176.1 lb

## 2017-11-05 DIAGNOSIS — I10 Essential (primary) hypertension: Secondary | ICD-10-CM | POA: Diagnosis not present

## 2017-11-05 DIAGNOSIS — Z23 Encounter for immunization: Secondary | ICD-10-CM | POA: Diagnosis not present

## 2017-11-05 DIAGNOSIS — M792 Neuralgia and neuritis, unspecified: Secondary | ICD-10-CM

## 2017-11-05 DIAGNOSIS — E785 Hyperlipidemia, unspecified: Secondary | ICD-10-CM | POA: Diagnosis not present

## 2017-11-05 DIAGNOSIS — F339 Major depressive disorder, recurrent, unspecified: Secondary | ICD-10-CM

## 2017-11-05 DIAGNOSIS — F419 Anxiety disorder, unspecified: Secondary | ICD-10-CM

## 2017-11-05 DIAGNOSIS — F5105 Insomnia due to other mental disorder: Secondary | ICD-10-CM

## 2017-11-05 DIAGNOSIS — G4700 Insomnia, unspecified: Secondary | ICD-10-CM

## 2017-11-05 DIAGNOSIS — F99 Mental disorder, not otherwise specified: Secondary | ICD-10-CM

## 2017-11-05 MED ORDER — METOPROLOL SUCCINATE ER 50 MG PO TB24: 50.0000 mg | ORAL_TABLET | Freq: Every day | ORAL | 0 refills | Status: AC

## 2017-11-05 MED ORDER — TEMAZEPAM 7.5 MG PO CAPS: 7.5000 mg | ORAL_CAPSULE | Freq: Every evening | ORAL | 1 refills | Status: DC | PRN

## 2017-11-05 MED ORDER — SERTRALINE HCL 100 MG PO TABS: 200.0000 mg | ORAL_TABLET | Freq: Every day | ORAL | 0 refills | Status: DC

## 2017-11-05 MED ORDER — HYDROCHLOROTHIAZIDE 25 MG PO TABS: 25.0000 mg | ORAL_TABLET | Freq: Every day | ORAL | 1 refills | Status: AC

## 2017-11-05 MED ORDER — GABAPENTIN 300 MG PO CAPS: ORAL_CAPSULE | ORAL | 1 refills | Status: AC

## 2017-11-05 MED ORDER — TRAZODONE HCL 100 MG PO TABS: ORAL_TABLET | ORAL | 0 refills | Status: DC

## 2017-11-05 NOTE — Progress Notes (Signed)
Patient ID: [name], female    DOB: 1956/01/07, 62 y.o.   MRN: [id number]  Chief Complaint  Patient presents with  . discuss labs  . Hyperlipidemia    Allergies Patient has no known allergies.  Subjective:   [name] is a 62 y.o. female who presents to [facility] today.  HPI Here for a visit for follow up. Reports that has several things to discuss.  Reports that feels "tired in the eyes" and has had eye bags for the past 3 years. Reports that she has tried different eye creams and it does not help. Reports that last night she was looking in the mirror and it was really  Noticeable and it bothers her.  Occasionally has some allergy symptoms. Gets some nasal congestion, sneezing, and has had allergies in eyes.  Was seen by eye doctor and had eye allergies in the past.  Given allergy drops for the eyes by the eye doctor. Does not like the dark circles. `Has an appointment with dermatology next week to discuss this and her hair thinning issues. Has been on the biotin for several months.   Has stopped the cabamazepine by Dr. Jannifer Franklin for the meralgia paresthetica. Quit the medication it b/c believe that this has contributed to the hair loss. Quitting the medicine did not make the pain worse. Is using the gabapentin 2 pills tid.  Reports that she is stable on this dose and needs a refill.  Here to review her cholesterol results.  Did get a message for me regarding her results and was happy with her LDL reduction.  No side effects with medication.  No new onset myalgias or issues.  Is working on eating healthy and trying to lose weight. Has been to see the dietician. Has been working on eating healthy, including low sugar and low-fat diet.  Reports that she is not going to be seeing the therapist anymore.  Feels like her mood is stable.  Denies any suicidal or homicidal ideation.  Is taking the sertraline and the trazodone.  Does occasionally use the temazepam for sleep.   Tends to use this medication when she has been on an altered sleep schedule for several days due to taking care of her mother with dementia.  Is not having any problems with the medication.  Feels her mood is greatly improved from where it was even 6 months ago.  No side effects with her medication.  Blood pressures been running well.  No palpitations, shortness of breath, chest pain, or swelling in her extremities.  Reports that she does need a refill today.  Is still following up with orthopedics.  Would like to get a flu shot.   Past Medical History:  Diagnosis Date  . Anxiety   . Depression   . GERD (gastroesophageal reflux disease)   . Hypertension   . Meralgia paresthetica of right side 06/05/2017    Past Surgical History:  Procedure Laterality Date  . KNEE CARTILAGE SURGERY Right    Workers Compensation, fell out of chair at work.     Family History  Problem Relation Age of Onset  . Alzheimer's disease Mother   . Hypertension Sister   . Diabetes Maternal Uncle   . Hypertension Sister      Social History   Socioeconomic History  . Marital status: Married    Spouse name: Not on file  . Number of children: Not on file  . Years of education: Not on file  .  Highest education level: Not on file  Occupational History  . Not on file  Social Needs  . Financial resource strain: Not on file  . Food insecurity:    Worry: Not on file    Inability: Not on file  . Transportation needs:    Medical: Not on file    Non-medical: Not on file  Tobacco Use  . Smoking status: Former Research scientist (life sciences)  . Smokeless tobacco: Never Used  Substance and Sexual Activity  . Alcohol use: No  . Drug use: No  . Sexual activity: Yes    Birth control/protection: Post-menopausal  Lifestyle  . Physical activity:    Days per week: Not on file    Minutes per session: Not on file  . Stress: Not on file  Relationships  . Social connections:    Talks on phone: Not on file    Gets together: Not on file      Attends religious service: Not on file    Active member of club or organization: Not on file    Attends meetings of clubs or organizations: Not on file    Relationship status: Not on file  Other Topics Concern  . Not on file  Social History Narrative   Lives   Caffeine use:    Married. 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    Spends every 3rd night at [employer].    Has been taking care of mother since 75.     Review of Systems  Constitutional: Negative for activity change, appetite change and fever.  HENT: Negative for tinnitus and trouble swallowing.   Eyes: Negative for visual disturbance.  Respiratory: Negative for cough, chest tightness, shortness of breath and wheezing.   Cardiovascular: Negative for chest pain, palpitations and leg swelling.  Gastrointestinal: Negative for abdominal pain, nausea and vomiting.  Genitourinary: Negative for dysuria, frequency and urgency.  Musculoskeletal: Negative for myalgias.  Neurological: Negative for dizziness, syncope and light-headedness.  Hematological: Negative for adenopathy.  Psychiatric/Behavioral: Negative for confusion, decreased concentration, dysphoric mood, hallucinations, sleep disturbance and suicidal ideas. The patient is not hyperactive.    Depression screen [facility] 2/9 11/05/2017 10/29/2017 10/15/2017 09/25/2017 08/13/2017  Decreased Interest 1 3 3 3 1   Down, Depressed, Hopeless 1 3 3 3 1   PHQ - 2 Score 2 6 6 6 2   Altered sleeping 0 1 3 3  0  Tired, decreased energy 1 2 3 2 1   Change in appetite 1 1 3 1 1   Feeling bad or failure about yourself  1 3 3 3 1   Trouble concentrating 0 1 1 1  0  Moving slowly or fidgety/restless 0 1 0 0 0  Suicidal thoughts 0 0 0 0 0  PHQ-9 Score 5 15 19 16 5   Difficult doing work/chores Not difficult at all Very difficult Very difficult Very difficult Somewhat difficult  Some recent data might be hidden    Objective:   BP 140/82 (BP Location: Left Arm, Patient Position:  Sitting, Cuff Size: Large)   Pulse 74   Temp 98.6 F (37 C) (Temporal)   Resp 12   Ht 5\' 2"  (1.575 m)   Wt 176 lb 1.9 oz (79.9 kg)   SpO2 97% Comment: room air  BMI 32.21 kg/m   Physical Exam  Constitutional: She is oriented to person, place, and time. She appears well-developed and well-nourished. No distress.  HENT:  Head: Normocephalic and atraumatic.  Mouth/Throat: Oropharynx is clear and moist.  Eyes: Pupils are equal, round, and  reactive to light. Conjunctivae are normal.  Neck: Normal range of motion. Neck supple. No thyromegaly present.  Cardiovascular: Normal rate, regular rhythm and normal heart sounds.  Pulmonary/Chest: Effort normal and breath sounds normal. No respiratory distress.  Neurological: She is alert and oriented to person, place, and time. No cranial nerve deficit.  Skin: Skin is warm and dry.  Psychiatric: She has a normal mood and affect. Her behavior is normal. Judgment and thought content normal.  Nursing note and vitals reviewed.  Depression screen [facility] 2/9 11/05/2017 10/29/2017 10/15/2017 09/25/2017 08/13/2017  Decreased Interest 1 3 3 3 1   Down, Depressed, Hopeless 1 3 3 3 1   PHQ - 2 Score 2 6 6 6 2   Altered sleeping 0 1 3 3  0  Tired, decreased energy 1 2 3 2 1   Change in appetite 1 1 3 1 1   Feeling bad or failure about yourself  1 3 3 3 1   Trouble concentrating 0 1 1 1  0  Moving slowly or fidgety/restless 0 1 0 0 0  Suicidal thoughts 0 0 0 0 0  PHQ-9 Score 5 15 19 16 5   Difficult doing work/chores Not difficult at all Very difficult Very difficult Very difficult Somewhat difficult  Some recent data might be hidden    Assessment and Plan  1. Insomnia, unspecified type Continue trazodone.  Use of temazepam sporadically and as needed use discussed again.  Patient understands the risks of this medication including sedation, habit-forming potential, decreased effectiveness over time, and  2. Hyperlipidemia LDL goal <100 LDL/cholesterol panel reviewed  with patient today.  LDL currently at goal.  Continue current medications as directed.  Plan on rechecking LFTs in 6 months. Hyperlipidemia and the associated risk of ASCVD were discussed today. Primary vs. Secondary prevention of ASCVD were discussed and how it relates to patient morbidity, mortality, and quality of life. Shared decision making with patient including the risks of statins vs.benefits of ASCVD risk reduction discussed.  Risks of stains discussed including myopathy, rhabdomyoloysis, liver problems, increased risk of diabetes discussed. We discussed heart healthy diet, lifestyle modifications, risk factor modifications, and adherence to the recommended treatment plan. We discussed the need to periodically monitor lipid panel and liver function tests while on statin therapy.    3. HTN, goal below 140/90 Stable.  Continue current medications.  Will monitor blood pressure.  Hopefully patient will continue to lose weight and improve diet and can be titrated off of medications. - hydrochlorothiazide (HYDRODIURIL) 25 MG tablet; Take 1 tablet (25 mg total) by mouth daily.  Dispense: 90 tablet; Refill: 1 - metoprolol succinate (TOPROL-XL) 50 MG 24 hr tablet; Take 1 tablet (50 mg total) by mouth daily. Take with or immediately following a meal.  Dispense: 90 tablet; Refill: 0  4. Need for immunization against influenza Vaccine administered - Flu Vaccine QUAD 36+ mos IM  5. Neuropathic pain of thigh, right Medication refilled.  Sedation precautions given. - gabapentin (NEURONTIN) 300 MG capsule; TAKE TWO (2) CAPSULES BY MOUTH THREE TIMES DAILY.  Dispense: 180 capsule; Refill: 1  6. 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  Continue medications as directed.  We did discuss temazepam use and its risks today. - traZODone (DESYREL) 100 MG tablet; Take 2 tablets by mouth every night as directed  Dispense: 180 tablet; Refill: 0 - temazepam (RESTORIL) 7.5 MG capsule; Take 1 capsule (7.5  mg total) by mouth at bedtime as needed. for sleep  Dispense: 30 capsule; Refill: 1  7. Depression,  recurrent (HCC)/anxiety Stable.  Continue current medications.  Implants with medication recommended.  Patient was counseled if she develops any changes in her mood to please contact medical help.  She also understands that she needs referral to see therapy again that she needs to let her new PCP office know.  She feels like her mood is improved and with her healthy lifestyle modifications that overall she is feeling much better.  Medications will be continued at this current dose and medication refills were sent in today. - sertraline (ZOLOFT) 100 MG tablet; Take 2 tablets (200 mg total) by mouth daily.  Dispense: 180 tablet; Refill: 0  Patient has protective factors of family and community support.  Patient reports that family believes is behaving rationally. Patient displays problem solving skills.   Patient specifically denies suicide ideation. Patient has access/information to healthcare contacts if situation or mood changes where patient is a risk to self or others or mood becomes unstable.   During the encounter, the patient had good eye contact and firm handshake regarding safety contract and agreement to seek help if mood worsens and not to harm self.   Patient understands the treatment plan and is in agreement. Agrees to keep follow up and call prior or return to clinic if needed.  Follow-up with new PCP office in 3 months or sooner if needed.  Keep scheduled visit with dermatology for issues with bags/eye puffiness.  Did discuss with patient that this is most likely genetic predisposition and also could possibly be allergy associated.  She is currently taking sertraline 10 mg a day and has been doing this for several days.  It is recommended that she continue this and see if this does allow for any change or benefit.  She will follow-up to discuss this at her next visit with new PCP office  and we will discuss this with dermatology. No follow-ups on file. Caren Macadam, MD 11/05/2017

## 2017-11-18 ENCOUNTER — Telehealth: Payer: Self-pay

## 2017-11-18 NOTE — Telephone Encounter (Signed)
Writer left the following message on voice mail for Breslyn Abdo,  Dear Manuela Schwartz Morr   You were referred to [facility] by Dr. Mannie Stabile at [facility].? Unfortunately, Dr. Mannie Stabile is leaving  the practice and the [facility] will no longer be available to you since it is part of [facility].?    If you decide to go to another primary care clinic within the [facility] system?that has?[employer], your services  can continue at that clinic.? Your current [facility] will end on November 18, 2017.    The following primary care clinics have [place]:    [place] [phone] [facility])  [facility] [phone] [place])    You may access behavioral health services through the following community agencies:     [facility]  [place]   [place], [place] [address]  [phone]    [facility] [place]  [place], [place] [address]  [phone]    For mental health crisis assistance please contact:    [employer]  [phone]     [place]  [address]  [place], [place] [address]  [phone]

## 2018-01-07 ENCOUNTER — Ambulatory Visit: Payer: Commercial Managed Care - PPO | Admitting: Nutrition

## 2018-01-17 ENCOUNTER — Other Ambulatory Visit: Payer: Self-pay | Admitting: Family Medicine

## 2018-01-17 DIAGNOSIS — M792 Neuralgia and neuritis, unspecified: Secondary | ICD-10-CM

## 2018-06-30 ENCOUNTER — Telehealth: Payer: Self-pay

## 2018-06-30 NOTE — Telephone Encounter (Signed)
Spoke with the patient and she has given verbal consent to do a [email or web address] visit and to file her insurance. Mobile carrier and number have been confirmed and sent.   Text: [phone] hygienist)

## 2018-07-01 ENCOUNTER — Ambulatory Visit: Payer: Commercial Managed Care - PPO | Admitting: Adult Health

## 2018-07-01 ENCOUNTER — Encounter: Payer: Commercial Managed Care - PPO | Admitting: Neurology

## 2018-07-01 ENCOUNTER — Other Ambulatory Visit: Payer: Self-pay

## 2018-07-01 NOTE — Telephone Encounter (Signed)
Spoke to pt and she will reschedule for tomorrow at 1045 on 07-02-18.  I emailed her [email or web address] link.  [email or web address]

## 2018-07-01 NOTE — Progress Notes (Signed)
This encounter was created in error - please disregard.

## 2018-07-02 ENCOUNTER — Other Ambulatory Visit: Payer: Self-pay

## 2018-07-02 ENCOUNTER — Ambulatory Visit (INDEPENDENT_AMBULATORY_CARE_PROVIDER_SITE_OTHER): Payer: Commercial Managed Care - PPO | Admitting: Neurology

## 2018-07-02 ENCOUNTER — Encounter: Payer: Self-pay | Admitting: Neurology

## 2018-07-02 DIAGNOSIS — G5711 Meralgia paresthetica, right lower limb: Secondary | ICD-10-CM

## 2018-07-02 NOTE — Progress Notes (Signed)
    Virtual Visit via Video Note  I connected with [name] on 07/02/18 at 10:45 AM EDT by a video enabled telemedicine application and verified that I am speaking with the correct person using two identifiers.  Location: Patient: At her home  Provider: At my home    I discussed the limitations of evaluation and management by telemedicine and the availability of in person appointments. The patient expressed understanding and agreed to proceed.  History of Present Illness: 07/02/2018 SS: [name] is a 63 year old female with history of right meralgia paresthetica.  She is currently taking gabapentin 300 mg, taking 2 capsules by mouth 3 times daily.  She reports that in September 2019 she stopped her carbamazepine.  She says she was having shaking in her hands, and that her hair was thinning and breaking.  She has been doing well since stopping the carbamazepine.  She reports she has good pain control.  She does notice that if she misses a dose of gabapentin she may have some pain.  She denies any trouble with walking or with her balance.  She reports that she does not exercise.  She denies any new problems or concerns.  Her primary care doctor has been providing a refill for gabapentin.  10/17/2017 Dr. Jannifer Franklin: [name] is a 63 year old right-handed black female with a history of meralgia paresthetica on the right side.  She currently is on a combination of carbamazepine and gabapentin with fairly good control of her discomfort.  She is sleeping fairly well at night.  She does have some right knee discomfort, she has had prior knee surgery.  The patient is not having excessive daytime drowsiness, or gait instability on the medication regimen.  She currently takes 600 mg 3 times daily gabapentin, 300 mg twice daily of carbamazepine.  She returns for an evaluation   Observations/Objective: I did convert to a telephone visit, was unable to get her microphone or camera to work for [email or web address]   Telephone visit, alert, answers questions appropriately, speech is clear and concise, good historian  Assessment and Plan: 1.  Right meralgia paresthetica  She is no longer taking carbamazepine.  She will continue taking gabapentin 300 mg, 2 capsules 3 times daily.  This has been beneficial for her.  Her primary care doctor is providing refills.  She does not wish to modify her dose at this time.  She will follow-up in the office in 6 months or sooner if needed.  She will call us if the pain returns.  Follow Up Instructions: I was made her a follow-up appointment for 6 months, January 06, 2019 at 1:15 PM   I discussed the assessment and treatment plan with the patient. The patient was provided an opportunity to ask questions and all were answered. The patient agreed with the plan and demonstrated an understanding of the instructions.   The patient was advised to call back or seek an in-person evaluation if the symptoms worsen or if the condition fails to improve as anticipated.  I provided 25 minutes of non-face-to-face time during this encounter.   Evangeline Dakin, DNP  [facility] [address], [address], [place] [address] [phone]

## 2018-07-02 NOTE — Progress Notes (Signed)
I have read the note, and I agree with the clinical assessment and plan.  Charles K Willis   

## 2018-09-04 NOTE — Progress Notes (Signed)
Virtual Visit via Video Note  I connected with [name] on 09/10/18 at  3:00 PM EDT by a video enabled telemedicine application and verified that I am speaking with the correct person using two identifiers.   I discussed the limitations of evaluation and management by telemedicine and the availability of in person appointments. The patient expressed understanding and agreed to proceed.     I discussed the assessment and treatment plan with the patient. The patient was provided an opportunity to ask questions and all were answered. The patient agreed with the plan and demonstrated an understanding of the instructions.   The patient was advised to call back or seek an in-person evaluation if the symptoms worsen or if the condition fails to improve as anticipated.  I provided 50 minutes of non-face-to-face time during this encounter.   [name], MD      Psychiatric Initial Adult Assessment   Patient Identification: [name] MRN:  [id number] Date of Evaluation:  09/10/2018 Referral Source: Dr. Caren Macadam Chief Complaint:   "I feel like I don't have any time" Visit Diagnosis:    ICD-10-CM   1. MDD (major depressive disorder), recurrent episode, moderate (HCC)  F33.1     History of Present Illness:   [name] is a 63 y.o. year old female with a history of depression, meralgia paresthetica, hyperlipidemia, who is referred for depression.   She was advised by Dr. Mannie Stabile to be seen for depression. 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 She would alternate the care with her other 2 sisters, and she spends every night at her mother's.  She states that it has been a challenge for the patient as she thought she would have more time to enjoy her life after she retired.  However, she also states that she would never want her mother to go to nursing home.  She states that her mother is like a child, and she will take care of her as  her mother did for the patient and her sisters when they were children. She also talks about her uncle, who also requires care at mother's place. She does not think her mother would quality for home health. She wishes to accomplish something.  Although she was recommended to take a walk regularly, she as not been able to do it. She states that "I feel like I don't have any time." When she does have time, she feels rushed as she feels that she needs to do house chores. She feels less supported by her husband, who is inpatient.   She has fair sleep.  She feels fatigue.  She has anhedonia.  She has fair concentration.  She denies SI.  She feels anxious at times.  She denies panic attacks.  She denies alcohol use or drug use  Daily routine: she stays over night till early in the morning to take care of her mother. She sleeps through the day and does house chores.   Medication- venlafaxine 150 mg daily for 2-3 months, Trazodone 100 mg at night, Clonazepam 0.5 mg daily to twice a day as needed   Associated Signs/Symptoms: Depression Symptoms:  depressed mood, anhedonia, psychomotor retardation, fatigue, (Hypo) Manic Symptoms:  denies decreased need for sleep, euphoria Anxiety Symptoms:  mild anxiety Psychotic Symptoms:  denies AH, VH PTSD Symptoms: Negative  Past Psychiatric History:  Outpatient: depression for many years Psychiatry admission: denies  Previous suicide attempt: denies  Past trials of medication:  sertraline, fluoxetine, venlafaxine, xanax, clonazepam, temazepam, Ambien History of violence: denies   Previous Psychotropic Medications: Yes   Substance Abuse History in the last 12 months:  No.  Consequences of Substance Abuse: NA  Past Medical History:  Past Medical History:  Diagnosis Date  . Anxiety   . Depression   . GERD (gastroesophageal reflux disease)   . Hypertension   . Meralgia paresthetica of right side 06/05/2017    Past Surgical History:  Procedure  Laterality Date  . KNEE CARTILAGE SURGERY Right    Workers Compensation, fell out of chair at work.     Family Psychiatric History:  As below   Family History:  Family History  Problem Relation Age of Onset  . Alzheimer's disease Mother   . Hypertension Sister   . Diabetes Maternal Uncle   . Hypertension Sister     Social History:   Social History   Socioeconomic History  . Marital status: Married    Spouse name: Not on file  . Number of children: Not on file  . Years of education: Not on file  . Highest education level: Not on file  Occupational History  . Not on file  Social Needs  . Financial resource strain: Not on file  . Food insecurity    Worry: Not on file    Inability: Not on file  . Transportation needs    Medical: Not on file    Non-medical: Not on file  Tobacco Use  . Smoking status: Former Research scientist (life sciences)  . Smokeless tobacco: Never Used  Substance and Sexual Activity  . Alcohol use: No  . Drug use: No  . Sexual activity: Yes    Birth control/protection: Post-menopausal  Lifestyle  . Physical activity    Days per week: Not on file    Minutes per session: Not on file  . Stress: Not on file  Relationships  . Social Herbalist on phone: Not on file    Gets together: Not on file    Attends religious service: Not on file    Active member of club or organization: Not on file    Attends meetings of clubs or organizations: Not on file    Relationship status: Not on file  Other Topics Concern  . Not on file  Social History Narrative   Lives   Caffeine use:    Married. 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    Spends every 3rd night at [facility].    Has been taking care of mother since 54.     Additional Social History:  Married since 2008, she lives with her husband and two dogs. She has no children Work: Retired in 2017, used to be a Armed forces operational officer at [employer] Education: BSW at  [employer]:  No Known Allergies  Metabolic Disorder Labs: Lab Results  Component Value Date   HGBA1C 5.5 11/06/2016   HGBA1C 5.5 11/06/2016   MPG 111 11/06/2016   No results found for: PROLACTIN Lab Results  Component Value Date   CHOL 149 10/30/2017   TRIG 73 10/30/2017   HDL 48 (L) 10/30/2017   CHOLHDL 3.1 10/30/2017   LDLCALC 85 10/30/2017   LDLCALC 189 (H) 07/10/2017   Lab Results  Component Value Date   TSH 0.98 10/30/2017    Therapeutic Level Labs: No results found for: LITHIUM Lab Results  Component Value Date   CBMZ 7.8 07/29/2017   No results found  for: VALPROATE  Current Medications: Current Outpatient Medications  Medication Sig Dispense Refill  . aspirin EC 81 MG tablet Take 81 mg by mouth daily.    Marland Kitchen atorvastatin (LIPITOR) 20 MG tablet Take 1 tablet (20 mg total) by mouth daily. 90 tablet 3  . Biotin 5 MG TABS Use one po qd  0  . chlorhexidine (PERIDEX) 0.12 % solution 5 mLs by Mouth Rinse route 2 (two) times daily.    . Cholecalciferol (D3-1000) 1000 units capsule Take 4,000 Units by mouth daily.    . clonazePAM (KLONOPIN) 0.5 MG tablet Take 0.5 mg by mouth 2 (two) times daily as needed for anxiety.    . gabapentin (NEURONTIN) 300 MG capsule TAKE TWO (2) CAPSULES BY MOUTH THREE TIMES DAILY. 180 capsule 1  . hydrochlorothiazide (HYDRODIURIL) 25 MG tablet Take 1 tablet (25 mg total) by mouth daily. 90 tablet 1  . metoprolol succinate (TOPROL-XL) 50 MG 24 hr tablet Take 1 tablet (50 mg total) by mouth daily. Take with or immediately following a meal. 90 tablet 0  . Multiple Vitamin (MULTIVITAMIN) capsule Take 1 capsule by mouth daily.    . temazepam (RESTORIL) 7.5 MG capsule Take 1 capsule (7.5 mg total) by mouth at bedtime as needed. for sleep 30 capsule 1  . traZODone (DESYREL) 100 MG tablet Take 2 tablets by mouth every night as directed (Patient taking differently: Take 1 tablets by mouth every night as directed) 180 tablet 0  . venlafaxine XR  (EFFEXOR-XR) 150 MG 24 hr capsule Take 150 mg by mouth daily with breakfast.     No current facility-administered medications for this visit.     Musculoskeletal: Strength & Muscle Tone: N/A Gait & Station: N/A Patient leans: N/A  Psychiatric Specialty Exam: Review of Systems  Psychiatric/Behavioral: Positive for depression. Negative for hallucinations, memory loss, substance abuse and suicidal ideas. The patient is nervous/anxious and has insomnia.   All other systems reviewed and are negative.   There were no vitals taken for this visit.There is no height or weight on file to calculate BMI.  General Appearance: Fairly Groomed  Eye Contact:  Good  Speech:  soft, slightly increased in latency  Volume:  Normal  Mood:  Depressed  Affect:  Appropriate, Congruent and Restricted  Thought Process:  Coherent  Orientation:  Full (Time, Place, and Person)  Thought Content:  Logical  Suicidal Thoughts:  No  Homicidal Thoughts:  No  Memory:  Immediate;   Good  Judgement:  Good  Insight:  Fair  Psychomotor Activity:  Normal  Concentration:  Concentration: Good and Attention Span: Good  Recall:  Good  Fund of Knowledge:Good  Language: Good  Akathisia:  No  Handed:  Right  AIMS (if indicated):  not done  Assets:  Communication Skills Desire for Improvement  ADL's:  Intact  Cognition: WNL  Sleep:  Fair on Trazodone   Screenings: GAD-7     Virtual BH Phone Follow Up from 10/29/2017 in [facility] Phone Follow Up from 09/25/2017 in [place] Phone Follow Up from 05/28/2017 in [place]  Total GAD-7 Score  12  17  14     PHQ2-9     Office Visit from 11/05/2017 in [facility] Phone Follow Up from 10/29/2017 in [place] from 10/15/2017 in [facility] [place] Phone Follow Up from 09/25/2017 in [facility] Office Visit from  08/13/2017 in [facility]  PHQ-2 Total Score  2  6  6  6  2   PHQ-9 Total Score  5  15  19  16  5       Assessment and Plan:  [name] is a 63 y.o. year old female with a history of depression, meralgia paresthetica, hyperlipidemia, who is referred for depression.   # MDD, moderate, recurrent without psychotic features Exam is notable for restricted affect with slight delay in speech, and the patient describes depressive symptoms with prominent fatigue and anhedonia.  Will uptitrate venlafaxine to target depression.  Discussed potential side effect of hypertension and headache.  Will continue trazodone for insomnia.  Will continue clonazepam as needed for anxiety.  Discussed behavioral activation and coached self compassion.  She will greatly benefit from CBT; will make referral.   Plan 1. Increase venlafaxine 225 mg daily (150 mg + 75 mg) 2. Continue Trazodone 100 mg at night, 3. Continue Clonazepam 0.5 mg once a day to twice a day as needed for anxiety 4. Referral to therapy  5. Next appointment: 8/19 at 10 AM for 30 mins, video - on gabapentin 600 mg three times a day  The patient demonstrates the following risk factors for suicide: Chronic risk factors for suicide include: psychiatric disorder of depression. Acute risk factors for suicide include: N/A. Protective factors for this patient include: coping skills and hope for the future. Considering these factors, the overall suicide risk at this point appears to be low. Patient is appropriate for outpatient follow up.   [name], MD 7/23/20203:39 PM

## 2018-09-10 ENCOUNTER — Other Ambulatory Visit: Payer: Self-pay

## 2018-09-10 ENCOUNTER — Encounter (HOSPITAL_COMMUNITY): Payer: Self-pay | Admitting: Psychiatry

## 2018-09-10 ENCOUNTER — Ambulatory Visit (INDEPENDENT_AMBULATORY_CARE_PROVIDER_SITE_OTHER): Payer: Commercial Managed Care - PPO | Admitting: Psychiatry

## 2018-09-10 DIAGNOSIS — F331 Major depressive disorder, recurrent, moderate: Secondary | ICD-10-CM

## 2018-09-10 MED ORDER — VENLAFAXINE HCL ER 75 MG PO CP24: ORAL_CAPSULE | ORAL | 0 refills | Status: DC

## 2018-09-10 MED ORDER — VENLAFAXINE HCL ER 150 MG PO CP24: ORAL_CAPSULE | ORAL | 0 refills | Status: DC

## 2018-09-10 NOTE — Patient Instructions (Addendum)
1. Increase venlafaxine 225 mg daily (150 mg + 75 mg) 2. Continue Trazodone 100 mg at night 3. Continue Clonazepam 0.5 mg once a day to twice a day as needed for anxiety 4. Referral to therapy  5. Next appointment: 8/19 at 10 AM

## 2018-10-01 NOTE — Progress Notes (Signed)
Virtual Visit via Video Note  I connected with [name] on 10/07/18 at 10:00 AM EDT by a video enabled telemedicine application and verified that I am speaking with the correct person using two identifiers.   I discussed the limitations of evaluation and management by telemedicine and the availability of in person appointments. The patient expressed understanding and agreed to proceed.     I discussed the assessment and treatment plan with the patient. The patient was provided an opportunity to ask questions and all were answered. The patient agreed with the plan and demonstrated an understanding of the instructions.   The patient was advised to call back or seek an in-person evaluation if the symptoms worsen or if the condition fails to improve as anticipated.  I provided 25 minutes of non-face-to-face time during this encounter.   [name], MD    [facility] MD/PA/NP OP Progress Note  10/07/2018 10:31 AM [name]  MRN:  [id number]  Chief Complaint:  Chief Complaint    Depression; Follow-up     HPI:  This is a follow-up appointment for depression.  She states that she has not noticed any change since the last time.  However, she went to see a specialist in [facility] yesterday.  Although there was some unpleasant thoughts on the way to [facility] while driving, she liked doing something good for herself. She also has worked on project in her house. She usually feels dread going to her mother's. She describes it as "constant work" as she also needs to take care of her materna uncle. Her mother is doing well considering her condition. She is "like an infant." She thinks that it is aging process and she thinks more about death/life lately as she ages. She has fair sleep.  She has low motivation and energy, although she is "making a baby step."  She denies SI. She denies anxiety or panic attacks. She denies any side effect after uptitration of Effexor.   Visit Diagnosis:    ICD-10-CM    1. MDD (major depressive disorder), recurrent episode, moderate ([place])  F33.1     Past Psychiatric History: Please see initial evaluation for full details. I have reviewed the history. No updates at this time.     Past Medical History:  Past Medical History:  Diagnosis Date  . Anxiety   . Depression   . GERD (gastroesophageal reflux disease)   . Hypertension   . Meralgia paresthetica of right side 06/05/2017    Past Surgical History:  Procedure Laterality Date  . KNEE CARTILAGE SURGERY Right    Workers Compensation, fell out of chair at work.     Family Psychiatric History: Please see initial evaluation for full details. I have reviewed the history. No updates at this time.     Family History:  Family History  Problem Relation Age of Onset  . Alzheimer's disease Mother   . Hypertension Sister   . Diabetes Maternal Uncle   . Hypertension Sister     Social History:  Social History   Socioeconomic History  . Marital status: Married    Spouse name: Not on file  . Number of children: Not on file  . Years of education: Not on file  . Highest education level: Not on file  Occupational History  . Not on file  Social Needs  . Financial resource strain: Not on file  . Food insecurity    Worry: Not on file    Inability: Not on file  . Transportation needs  Medical: Not on file    Non-medical: Not on file  Tobacco Use  . Smoking status: Former Research scientist (life sciences)  . Smokeless tobacco: Never Used  Substance and Sexual Activity  . Alcohol use: No  . Drug use: No  . Sexual activity: Yes    Birth control/protection: Post-menopausal  Lifestyle  . Physical activity    Days per week: Not on file    Minutes per session: Not on file  . Stress: Not on file  Relationships  . Social Herbalist on phone: Not on file    Gets together: Not on file    Attends religious service: Not on file    Active member of club or organization: Not on file    Attends meetings of clubs  or organizations: Not on file    Relationship status: Not on file  Other Topics Concern  . Not on file  Social History Narrative   Lives   Caffeine use:    Married. 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    Spends every 3rd night at [facility].    Has been taking care of mother since 3.     Allergies: No Known Allergies  Metabolic Disorder Labs: Lab Results  Component Value Date   HGBA1C 5.5 11/06/2016   HGBA1C 5.5 11/06/2016   MPG 111 11/06/2016   No results found for: PROLACTIN Lab Results  Component Value Date   CHOL 149 10/30/2017   TRIG 73 10/30/2017   HDL 48 (L) 10/30/2017   CHOLHDL 3.1 10/30/2017   LDLCALC 85 10/30/2017   LDLCALC 189 (H) 07/10/2017   Lab Results  Component Value Date   TSH 0.98 10/30/2017   TSH 1.23 11/06/2016   TSH 1.23 11/06/2016    Therapeutic Level Labs: No results found for: LITHIUM No results found for: VALPROATE No components found for:  CBMZ  Current Medications: Current Outpatient Medications  Medication Sig Dispense Refill  . aspirin EC 81 MG tablet Take 81 mg by mouth daily.    Marland Kitchen atorvastatin (LIPITOR) 20 MG tablet Take 1 tablet (20 mg total) by mouth daily. 90 tablet 3  . Biotin 5 MG TABS Use one po qd  0  . chlorhexidine (PERIDEX) 0.12 % solution 5 mLs by Mouth Rinse route 2 (two) times daily.    . Cholecalciferol (D3-1000) 1000 units capsule Take 4,000 Units by mouth daily.    . clonazePAM (KLONOPIN) 0.5 MG tablet Take 0.5 mg by mouth 2 (two) times daily as needed for anxiety.    . gabapentin (NEURONTIN) 300 MG capsule TAKE TWO (2) CAPSULES BY MOUTH THREE TIMES DAILY. 180 capsule 1  . hydrochlorothiazide (HYDRODIURIL) 25 MG tablet Take 1 tablet (25 mg total) by mouth daily. 90 tablet 1  . metoprolol succinate (TOPROL-XL) 50 MG 24 hr tablet Take 1 tablet (50 mg total) by mouth daily. Take with or immediately following a meal. 90 tablet 0  . Multiple Vitamin (MULTIVITAMIN) capsule Take 1 capsule by mouth  daily.    . traZODone (DESYREL) 100 MG tablet Take 2 tablets by mouth every night as directed (Patient taking differently: Take 1 tablets by mouth every night as directed) 180 tablet 0  . venlafaxine XR (EFFEXOR-XR) 150 MG 24 hr capsule 225 mg daily (150 mg + 75 mg) 30 capsule 0  . venlafaxine XR (EFFEXOR-XR) 75 MG 24 hr capsule 225 mg daily (150 mg + 75 mg ) 90 capsule 0   No current facility-administered medications for this  visit.      Musculoskeletal: Strength & Muscle Tone: N/A Gait & Station: N/A Patient leans: N/A  Psychiatric Specialty Exam: Review of Systems  Psychiatric/Behavioral: Positive for depression. Negative for hallucinations, memory loss, substance abuse and suicidal ideas. The patient is not nervous/anxious and does not have insomnia.   All other systems reviewed and are negative.   There were no vitals taken for this visit.There is no height or weight on file to calculate BMI.  General Appearance: Fairly Groomed  Eye Contact:  Good  Speech:  Clear and Coherent  Volume:  Normal  Mood:  Depressed  Affect:  Appropriate, Congruent and Restricted- improving  Thought Process:  Coherent  Orientation:  Full (Time, Place, and Person)  Thought Content: Logical   Suicidal Thoughts:  No  Homicidal Thoughts:  No  Memory:  Immediate;   Good  Judgement:  Good  Insight:  Fair  Psychomotor Activity:  Normal  Concentration:  Concentration: Good and Attention Span: Good  Recall:  Good  Fund of Knowledge: Good  Language: Good  Akathisia:  No  Handed:  Right  AIMS (if indicated): not done  Assets:  Communication Skills Desire for Improvement  ADL's:  Intact  Cognition: WNL  Sleep:  Fair   Screenings: GAD-7     Virtual BH Phone Follow Up from 10/29/2017 in [place] Phone Follow Up from 09/25/2017 in [facility] Phone Follow Up from 05/28/2017 in [place]  Total GAD-7 Score  12  17  14     PHQ2-9      Office Visit from 11/05/2017 in [facility] Phone Follow Up from 10/29/2017 in [facility] Nutrition from 10/15/2017 in [facility] [place] Phone Follow Up from 09/25/2017 in [facility] Office Visit from 08/13/2017 in [facility]  PHQ-2 Total Score  2  6  6  6  2   PHQ-9 Total Score  5  15  19  16  5        Assessment and Plan:  Kashish Yglesias is a 63 y.o. year old female with a history of depression,meralgia paresthetica, hyperlipidemia , who presents for follow up appointment for depression.   # MDD, moderate, recurrent without psychotic features Exam is notable for less restricted affect/improved latency in speech, and she has started to engage in daily activity, although she denies any significant change in her mood symptoms since up titration of venlafaxine.  Will continue current dose of venlafaxine to see if it exerts its more benefit.  Will continue trazodone as needed for insomnia.  Will continue clonazepam as needed for anxiety.  Discussed behavioral activation and coached self compassion.  She is referred to therapy.   Plan I have reviewed and updated plans as below 1. Continue venlafaxine 225 mg daily (150 mg + 75 mg) 2. Continue Trazodone 100 mg at night 3. Continue Clonazepam 0.5 mg once a day to twice a day as needed for anxiety 4. Referred to therapy 5. Next appointment: 9/30 at 10:40  - on gabapentin 600 mg three times a day  Past trials of medication: sertraline, fluoxetine, venlafaxine, xanax, clonazepam, temazepam, Ambien  The patient demonstrates the following risk factors for suicide: Chronic risk factors for suicide include: psychiatric disorder of depression. Acute risk factors for suicide include: N/A. Protective factors for this patient include: coping skills and hope for the future. Considering these factors, the overall suicide risk at this point  appears to be low.  Patient is appropriate for outpatient follow up.  The duration of this appointment visit was 25 minutes of non face-to-face time with the patient.  Greater than 50% of this time was spent in counseling, explanation of  diagnosis, planning of further management, and coordination of care. [name], MD 10/07/2018, 10:31 AM

## 2018-10-07 ENCOUNTER — Other Ambulatory Visit: Payer: Self-pay

## 2018-10-07 ENCOUNTER — Ambulatory Visit (INDEPENDENT_AMBULATORY_CARE_PROVIDER_SITE_OTHER): Payer: Commercial Managed Care - PPO | Admitting: Psychiatry

## 2018-10-07 ENCOUNTER — Encounter (HOSPITAL_COMMUNITY): Payer: Self-pay | Admitting: Psychiatry

## 2018-10-07 DIAGNOSIS — F331 Major depressive disorder, recurrent, moderate: Secondary | ICD-10-CM | POA: Diagnosis not present

## 2018-10-07 MED ORDER — VENLAFAXINE HCL ER 75 MG PO CP24: ORAL_CAPSULE | ORAL | 0 refills | Status: DC

## 2018-10-07 NOTE — Patient Instructions (Signed)
1. Continue venlafaxine 225 mg daily (150 mg + 75 mg) 2. Continue Trazodone 100 mg at night 3. Continue Clonazepam 0.5 mg once a day to twice a day as needed for anxiety 4.  Next appointment: 9/30 at 10:40

## 2018-10-13 ENCOUNTER — Encounter (HOSPITAL_COMMUNITY): Payer: Self-pay | Admitting: Psychiatry

## 2018-10-13 ENCOUNTER — Ambulatory Visit (INDEPENDENT_AMBULATORY_CARE_PROVIDER_SITE_OTHER): Payer: Commercial Managed Care - PPO | Admitting: Psychiatry

## 2018-10-13 ENCOUNTER — Other Ambulatory Visit: Payer: Self-pay

## 2018-10-13 DIAGNOSIS — F331 Major depressive disorder, recurrent, moderate: Secondary | ICD-10-CM
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
## 2018-11-05 ENCOUNTER — Other Ambulatory Visit: Payer: Self-pay

## 2018-11-05 ENCOUNTER — Ambulatory Visit (INDEPENDENT_AMBULATORY_CARE_PROVIDER_SITE_OTHER): Payer: Commercial Managed Care - PPO | Admitting: Psychiatry

## 2018-11-05 DIAGNOSIS — F331 Major depressive disorder, recurrent, moderate: Secondary | ICD-10-CM

## 2018-11-05 NOTE — Progress Notes (Signed)
Virtual Visit via Video Note  I connected with [name] on 11/05/18 at  2:00 PM EDT by a video enabled telemedicine application and verified that I am speaking with the correct person using two identifiers.   I discussed the limitations of evaluation and management by telemedicine and the availability of in person appointments. The patient expressed understanding and agreed to proceed.  I provided 50  minutes of non-face-to-face time during this encounter.   [name], LCSW    THERAPIST PROGRESS NOTE  Session Time: Thursday 11/05/2018 2:00 PM - 2:50 PM  Participation Level: Active  Behavioral Response: CasualAlertDepressed  Type of Therapy: Individual Therapy  Treatment Goals addressed:   Establish rapport, learn and implement cognitive and behavioral strategies to overcome depression  Interventions: Supportive, CBT  Summary: [name] is a 63 y.o. female who is referred for services by psychiatrist Dr. Modesta Messing due to patient experiencing symptoms of depression and anxiety. She denies any psychiatric hospitalizations. She reports previous involvement in outpatient treatment  at [facility] in [place] about 10 years ago due to depression.  Patient reports feelings of sadness as well as feeling bad about self.  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  Her mother has Alzheimer's and her 84 year old uncle has kidney failure.  She states feeling as though she does not have a life.  She also states seeing them deteriorate makes her sad and think about the mortality of her own life.  Patient last was seen via virtual visit about 2 weeks ago.  She reports no change in symptoms.  She continues to experience depressed mood, hopelessness, worthlessness, fatigue, irritability, poor motivation, and decreased interest in activities.  She continues to help provide care for her mother and her uncle with assistance from her 2  sisters.  She reports this is very stressful as she states she does not have the time to do the things she would like to do.  She reports going to her mother's home daily and spending every third night.  She and sisters have not considered hiring someone to assist with care although both her mother and uncle have the funds to do this.  Per patient's report, uncle probably will become upset and not agree to do this.  Patient reports wanting to improve self-care regarding exercise but states just not feeling motivated enough to do it.   Suicidal/Homicidal: Nowithout intent/plan  Therapist Response: Reviewed symptoms, administered PHQ-9, established rapport, discussed stressors, facilitated expression of thoughts and feelings, validated feelings, developed treatment plan, obtained patient's permission to initial plan for patient as this was a virtual visit, began to identify patient's values to help establish behavioral targets and promote engagement in value congruent behavior  Plan: Return again in 2weeks.  Diagnosis: Axis I: MDD, Recurrent    Axis II: Deferred    [name], LCSW 11/05/2018

## 2018-11-11 NOTE — Progress Notes (Signed)
Virtual Visit via Video Note  I connected with [name] on 11/17/18 at 10:40 AM EDT by a video enabled telemedicine application and verified that I am speaking with the correct person using two identifiers.   I discussed the limitations of evaluation and management by telemedicine and the availability of in person appointments. The patient expressed understanding and agreed to proceed.     I discussed the assessment and treatment plan with the patient. The patient was provided an opportunity to ask questions and all were answered. The patient agreed with the plan and demonstrated an understanding of the instructions.   The patient was advised to call back or seek an in-person evaluation if the symptoms worsen or if the condition fails to improve as anticipated.  I provided 15 minutes of non-face-to-face time during this encounter.   [name], MD     [facility] MD/PA/NP OP Progress Note  11/17/2018 11:09 AM [name]  MRN:  [id number]  Chief Complaint:  Chief Complaint    Depression; Follow-up     HPI:  This is a follow-up appointment for depression.  She states that there are days she struggles with depression.  She feels that it especially worsens when it rains. She tries to "work through it" She feels that she does not have enough time for herself. She feels dread before going to her mother's while she usually feels a little better as her sister is there. She feels irritable at times.  Although she feels less anxious, she feels like she is constantly doing something, watching the clock. She has been working on Diplomatic Services operational officer. She shares that she will go to see a stylist after this visit in [place]. She sleeps well with trazodone.  She has mild anhedonia and low energy.  She has fair concentration.  She has good appetite.  She denies SI.  She denies panic attack.   Visit Diagnosis:    ICD-10-CM   1. MDD (major depressive disorder), recurrent episode, moderate ([place])  F33.1      Past Psychiatric History: Please see initial evaluation for full details. I have reviewed the history. No updates at this time.     Past Medical History:  Past Medical History:  Diagnosis Date  . Anxiety   . Depression   . GERD (gastroesophageal reflux disease)   . Hypertension   . Meralgia paresthetica of right side 06/05/2017    Past Surgical History:  Procedure Laterality Date  . KNEE CARTILAGE SURGERY Right    Workers Compensation, fell out of chair at work.     Family Psychiatric History: Please see initial evaluation for full details. I have reviewed the history. No updates at this time.     Family History:  Family History  Problem Relation Age of Onset  . Alzheimer's disease Mother   . Hypertension Sister   . Diabetes Maternal Uncle   . Hypertension Sister     Social History:  Social History   Socioeconomic History  . Marital status: Married    Spouse name: Not on file  . Number of children: Not on file  . Years of education: Not on file  . Highest education level: Not on file  Occupational History  . Not on file  Social Needs  . Financial resource strain: Not on file  . Food insecurity    Worry: Not on file    Inability: Not on file  . Transportation needs    Medical: Not on file    Non-medical: Not  on file  Tobacco Use  . Smoking status: Former Smoker    Quit date: 10/13/1983    Years since quitting: 35.1  . Smokeless tobacco: Never Used  Substance and Sexual Activity  . Alcohol use: No  . Drug use: No  . Sexual activity: Not Currently    Birth control/protection: Post-menopausal  Lifestyle  . Physical activity    Days per week: Not on file    Minutes per session: Not on file  . Stress: Not on file  Relationships  . Social Herbalist on phone: Not on file    Gets together: Not on file    Attends religious service: Not on file    Active member of club or organization: Not on file    Attends meetings of clubs or  organizations: Not on file    Relationship status: Not on file  Other Topics Concern  . Not on file  Social History Narrative   Lives   Caffeine use:    Married. 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    Spends every 3rd night at [facility].    Has been taking care of mother since 56.     Allergies: No Known Allergies  Metabolic Disorder Labs: Lab Results  Component Value Date   HGBA1C 5.5 11/06/2016   HGBA1C 5.5 11/06/2016   MPG 111 11/06/2016   No results found for: PROLACTIN Lab Results  Component Value Date   CHOL 149 10/30/2017   TRIG 73 10/30/2017   HDL 48 (L) 10/30/2017   CHOLHDL 3.1 10/30/2017   LDLCALC 85 10/30/2017   LDLCALC 189 (H) 07/10/2017   Lab Results  Component Value Date   TSH 0.98 10/30/2017   TSH 1.23 11/06/2016   TSH 1.23 11/06/2016    Therapeutic Level Labs: No results found for: LITHIUM No results found for: VALPROATE No components found for:  CBMZ  Current Medications: Current Outpatient Medications  Medication Sig Dispense Refill  . aspirin EC 81 MG tablet Take 81 mg by mouth daily.    Marland Kitchen atorvastatin (LIPITOR) 20 MG tablet Take 1 tablet (20 mg total) by mouth daily. 90 tablet 3  . Biotin 5 MG TABS Use one po qd  0  . buPROPion (WELLBUTRIN XL) 150 MG 24 hr tablet Take 1 tablet (150 mg total) by mouth daily. 30 tablet 1  . chlorhexidine (PERIDEX) 0.12 % solution 5 mLs by Mouth Rinse route 2 (two) times daily.    . Cholecalciferol (D3-1000) 1000 units capsule Take 4,000 Units by mouth daily.    . clonazePAM (KLONOPIN) 0.5 MG tablet Take 0.5 mg by mouth 2 (two) times daily as needed for anxiety.    . gabapentin (NEURONTIN) 300 MG capsule TAKE TWO (2) CAPSULES BY MOUTH THREE TIMES DAILY. 180 capsule 1  . hydrochlorothiazide (HYDRODIURIL) 25 MG tablet Take 1 tablet (25 mg total) by mouth daily. 90 tablet 1  . metoprolol succinate (TOPROL-XL) 50 MG 24 hr tablet Take 1 tablet (50 mg total) by mouth daily. Take with or  immediately following a meal. 90 tablet 0  . Multiple Vitamin (MULTIVITAMIN) capsule Take 1 capsule by mouth daily.    . traZODone (DESYREL) 100 MG tablet Take 2 tablets by mouth every night as directed (Patient taking differently: Take 1 tablets by mouth every night as directed) 180 tablet 0  . venlafaxine XR (EFFEXOR-XR) 150 MG 24 hr capsule 225 mg daily (150 mg + 75 mg) 90 capsule 0  . venlafaxine  XR (EFFEXOR-XR) 75 MG 24 hr capsule 225 mg daily (150 mg + 75 mg ) 90 capsule 0   No current facility-administered medications for this visit.      Musculoskeletal: Strength & Muscle Tone: N/A Gait & Station: N/A Patient leans: N/A  Psychiatric Specialty Exam: Review of Systems  Psychiatric/Behavioral: Positive for depression. Negative for hallucinations, memory loss, substance abuse and suicidal ideas. The patient is nervous/anxious and has insomnia.   All other systems reviewed and are negative.   There were no vitals taken for this visit.There is no height or weight on file to calculate BMI.  General Appearance: Fairly Groomed  Eye Contact:  Good  Speech:  Clear and Coherent  Volume:  Normal  Mood:  Depressed  Affect:  Appropriate, Congruent and Restricted  Thought Process:  Coherent  Orientation:  Full (Time, Place, and Person)  Thought Content: Logical   Suicidal Thoughts:  No  Homicidal Thoughts:  No  Memory:  Immediate;   Good  Judgement:  Good  Insight:  Fair  Psychomotor Activity:  Normal  Concentration:  Concentration: Good and Attention Span: Good  Recall:  Good  Fund of Knowledge: Good  Language: Good  Akathisia:  No  Handed:  Right  AIMS (if indicated): not done  Assets:  Communication Skills Desire for Improvement  ADL's:  Intact  Cognition: WNL  Sleep:  Good on Trazodone   Screenings: GAD-7     Virtual [facility] Phone Follow Up from 10/29/2017 in [place] Phone Follow Up from 09/25/2017 in [facility] Phone Follow Up  from 05/28/2017 in [place]  Total GAD-7 Score  12  17  14     PHQ2-9     Office Visit from 11/05/2018 in [place] Office Visit from 11/05/2017 in [facility] Phone Follow Up from 10/29/2017 in [facility] Nutrition from 10/15/2017 in [facility] Phone Follow Up from 09/25/2017 in [facility]  PHQ-2 Total Score  2  2  6  6  6   PHQ-9 Total Score  5  5  15  19  16        Assessment and Plan:  [name] is a 63 y.o. year old female with a history of depression,,meralgia paresthetica, hyperlipidemia , who presents for follow up appointment for MDD (major depressive disorder), recurrent episode, moderate ([place])  # MDD, moderate, recurrent without psychotic features Exam is notable for less restricted affect, and there has been overall improvement in depressive symptoms/psychomotor activity since her last visit.  Psychosocial stressors includes taking care of her mother with Alzheimer's disease, and her uncle.  Will add bupropion as adjunctive treatment for depression.  She has no known history of seizure.  Discussed potential side effect of headache and worsening in anxiety.  Will continue venlafaxine to target depression.  Will continue trazodone as needed for insomnia.  Will continue clonazepam as needed for anxiety.  Coached self compassion.  Provided psychoeducation of caregiver burnout.  She is encouraged to continue to see a therapist.   Plan I have reviewed and updated plans as below 1. Continue venlafaxine 225 mg daily (150 mg + 75 mg) 2. Start bupropion 150 mg daily  3. Continue Trazodone 100 mg at night 4. Continue Clonazepam 0.5 mg once a day to twice a day as needed for anxiety 5. Next appointment: 11/11 at 10:20 for 20 mins, video - on gabapentin 600  mg three times a day  Past trials of medication:sertraline,  fluoxetine, venlafaxine, xanax, clonazepam, temazepam, Ambien  The patient demonstrates the following risk factors for suicide: Chronic risk factors for suicide include:psychiatric disorder ofdepression. Acute risk factorsfor suicide include: N/A. Protective factorsfor this patient include: coping skills and hope for the future. Considering these factors, the overall suicide risk at this point appears to below. Patientisappropriate for outpatient follow up.  [name], MD 11/17/2018, 11:09 AM

## 2018-11-17 ENCOUNTER — Encounter (HOSPITAL_COMMUNITY): Payer: Self-pay | Admitting: Psychiatry

## 2018-11-17 ENCOUNTER — Other Ambulatory Visit: Payer: Self-pay

## 2018-11-17 ENCOUNTER — Ambulatory Visit (INDEPENDENT_AMBULATORY_CARE_PROVIDER_SITE_OTHER): Payer: Commercial Managed Care - PPO | Admitting: Psychiatry

## 2018-11-17 DIAGNOSIS — F331 Major depressive disorder, recurrent, moderate: Secondary | ICD-10-CM

## 2018-11-17 MED ORDER — VENLAFAXINE HCL ER 150 MG PO CP24: ORAL_CAPSULE | ORAL | 0 refills | Status: DC

## 2018-11-17 MED ORDER — BUPROPION HCL ER (XL) 150 MG PO TB24: 150.0000 mg | ORAL_TABLET | Freq: Every day | ORAL | 1 refills | Status: DC

## 2018-11-17 NOTE — Patient Instructions (Addendum)
1. Continue venlafaxine 225 mg daily (150 mg + 75 mg) 2. Start bupropion 150 mg daily  3. Continue Trazodone 100 mg at night 4. Next appointment: 11/11 at 10:20

## 2018-11-24 ENCOUNTER — Ambulatory Visit (INDEPENDENT_AMBULATORY_CARE_PROVIDER_SITE_OTHER): Payer: Commercial Managed Care - PPO | Admitting: Psychiatry

## 2018-11-24 ENCOUNTER — Other Ambulatory Visit: Payer: Self-pay

## 2018-11-24 DIAGNOSIS — F331 Major depressive disorder, recurrent, moderate: Secondary | ICD-10-CM | POA: Diagnosis not present

## 2018-11-24 NOTE — Progress Notes (Signed)
Virtual Visit via Video Note  I connected with Risha Carew on 11/24/18 at  3:00 PM EDT by a video enabled telemedicine application and verified that I am speaking with the correct person using two identifiers.   I discussed the limitations of evaluation and management by telemedicine and the availability of in person appointments. The patient expressed understanding and agreed to proceed.  I provided  45 minutes of non-face-to-face time during this encounter.   [name], LCSW   THERAPIST PROGRESS NOTE  Session Time: Tuesday 11/24/2018 3:15 PM - 4:00 PM  Participation Level: Active  Behavioral Response: CasualAlertDepressed  Type of Therapy: Individual Therapy  Treatment Goals addressed:   Establish rapport, learn and implement cognitive and behavioral strategies to overcome depression  Interventions: Supportive, CBT  Summary: Elsia Wolfrey is a 63 y.o. female who is referred for services by psychiatrist Dr. Modesta Messing due to patient experiencing symptoms of depression and anxiety. She denies any psychiatric hospitalizations. She reports previous involvement in outpatient treatment  at [facility] in [place] about 10 years ago due to depression.  Patient reports feelings of sadness as well as feeling bad about self.  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  Her mother has Alzheimer's and her 38 year old uncle has kidney failure.  She states feeling as though she does not have a life.  She also states seeing them deteriorate makes her sad and think about the mortality of her own life.  Patient last was seen via virtual visit about 2 weeks ago.  She reports feeling a little better since last session.  She recently started taking Wellbutrin as instructed by psychiatrist Dr. Modesta Messing and is hopeful medication will increase her energy level.  She reports trying to focus on the moment rather than dwelling on feelings of dread and  thoughts about going to provide care for her mother and uncle.  She is beginning to experience increased interest in activities and is starting to make plans for household projects.  She also expresses increased interest in self-care and expresses a desire to lose weight.   Suicidal/Homicidal: Nowithout intent/plan  Therapist Response: Reviewed symptoms, praised and reinforced patient's efforts and beginning to make plans for household projects, discussed stressors, facilitated expression of thoughts and feelings, validated feelings, discussed the cycle of depression and ways to intervene with the use of behavioral activation, assisted patient develop plan to clean out her planters and decorate her front door for the fall this week, assisted patient identify and address thoughts and processes that may inhibit completion of plan, assisted patient identify negative thoughts and ways to relate to thought that may inhibit plan, assisted patient develop a list of benefits for completing plan and a list of consequences for not completing plan, assigned patient to read both lists daily  Plan: Return again in 2weeks.  Diagnosis: Axis I: MDD, Recurrent    Axis II: Deferred    [name], LCSW 11/24/2018

## 2018-12-09 ENCOUNTER — Other Ambulatory Visit: Payer: Self-pay

## 2018-12-09 ENCOUNTER — Ambulatory Visit (HOSPITAL_COMMUNITY): Payer: Commercial Managed Care - PPO | Admitting: Psychiatry

## 2018-12-11 ENCOUNTER — Other Ambulatory Visit (HOSPITAL_COMMUNITY): Payer: Self-pay | Admitting: Family Medicine

## 2018-12-11 DIAGNOSIS — Z1231 Encounter for screening mammogram for malignant neoplasm of breast: Secondary | ICD-10-CM

## 2018-12-23 NOTE — Progress Notes (Signed)
Virtual Visit via Video Note  I connected with [name] on 12/30/18 at 10:20 AM EST by a video enabled telemedicine application and verified that I am speaking with the correct person using two identifiers.   I discussed the limitations of evaluation and management by telemedicine and the availability of in person appointments. The patient expressed understanding and agreed to proceed.     I discussed the assessment and treatment plan with the patient. The patient was provided an opportunity to ask questions and all were answered. The patient agreed with the plan and demonstrated an understanding of the instructions.   The patient was advised to call back or seek an in-person evaluation if the symptoms worsen or if the condition fails to improve as anticipated.  I provided 15 minutes of non-face-to-face time during this encounter.   [name], MD    [facility] MD/PA/NP OP Progress Note  12/30/2018 10:50 AM [name]  MRN:  [id number]  Chief Complaint:  Chief Complaint    Depression; Follow-up     HPI:  This is a follow-up appointment for depression.  She states that she feels that she constantly does something all the time. She wishes to have time to relax. She is amenable to try taking time for five minutes doing nothing every day when her two sisters are helping their mother and uncle. She felt stressed when her mother was admitted for UTI in October.  She could not into the hospital with her mother as she used to due to pandemic.  Her mother is doing better now.  Her uncle who is on dialysis needs assistance.  She has middle insomnia.  She feels less fatigue.  Although she feels down at times, it has been improving.  She has fair concentration.  She has good appetite.  She denies SI.  She feels less anxious.  She denies panic attacks.  She takes clonazepam once a day for anxiety (used to take it twice a day).  Visit Diagnosis:    ICD-10-CM   1. MDD (major depressive disorder),  recurrent episode, mild ([place])  F33.0     Past Psychiatric History: Please see initial evaluation for full details. I have reviewed the history. No updates at this time.     Past Medical History:  Past Medical History:  Diagnosis Date  . Anxiety   . Depression   . GERD (gastroesophageal reflux disease)   . Hypertension   . Meralgia paresthetica of right side 06/05/2017    Past Surgical History:  Procedure Laterality Date  . KNEE CARTILAGE SURGERY Right    Workers Compensation, fell out of chair at work.     Family Psychiatric History: Please see initial evaluation for full details. I have reviewed the history. No updates at this time.     Family History:  Family History  Problem Relation Age of Onset  . Alzheimer's disease Mother   . Hypertension Sister   . Diabetes Maternal Uncle   . Hypertension Sister     Social History:  Social History   Socioeconomic History  . Marital status: Married    Spouse name: Not on file  . Number of children: Not on file  . Years of education: Not on file  . Highest education level: Not on file  Occupational History  . Not on file  Social Needs  . Financial resource strain: Not on file  . Food insecurity    Worry: Not on file    Inability: Not on file  .  Transportation needs    Medical: Not on file    Non-medical: Not on file  Tobacco Use  . Smoking status: Former Smoker    Quit date: 10/13/1983    Years since quitting: 35.2  . Smokeless tobacco: Never Used  Substance and Sexual Activity  . Alcohol use: No  . Drug use: No  . Sexual activity: Not Currently    Birth control/protection: Post-menopausal  Lifestyle  . Physical activity    Days per week: Not on file    Minutes per session: Not on file  . Stress: Not on file  Relationships  . Social Herbalist on phone: Not on file    Gets together: Not on file    Attends religious service: Not on file    Active member of club or organization: Not on file     Attends meetings of clubs or organizations: Not on file    Relationship status: Not on file  Other Topics Concern  . Not on file  Social History Narrative   Lives   Caffeine use:    Married. 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    Spends every 3rd night at [employer].    Has been taking care of mother since 32.     Allergies: No Known Allergies  Metabolic Disorder Labs: Lab Results  Component Value Date   HGBA1C 5.5 11/06/2016   HGBA1C 5.5 11/06/2016   MPG 111 11/06/2016   No results found for: PROLACTIN Lab Results  Component Value Date   CHOL 149 10/30/2017   TRIG 73 10/30/2017   HDL 48 (L) 10/30/2017   CHOLHDL 3.1 10/30/2017   LDLCALC 85 10/30/2017   LDLCALC 189 (H) 07/10/2017   Lab Results  Component Value Date   TSH 0.98 10/30/2017   TSH 1.23 11/06/2016   TSH 1.23 11/06/2016    Therapeutic Level Labs: No results found for: LITHIUM No results found for: VALPROATE No components found for:  CBMZ  Current Medications: Current Outpatient Medications  Medication Sig Dispense Refill  . aspirin EC 81 MG tablet Take 81 mg by mouth daily.    Marland Kitchen atorvastatin (LIPITOR) 20 MG tablet Take 1 tablet (20 mg total) by mouth daily. 90 tablet 3  . Biotin 5 MG TABS Use one po qd  0  . buPROPion (WELLBUTRIN XL) 150 MG 24 hr tablet Take 1 tablet (150 mg total) by mouth daily. 90 tablet 0  . chlorhexidine (PERIDEX) 0.12 % solution 5 mLs by Mouth Rinse route 2 (two) times daily.    . Cholecalciferol (D3-1000) 1000 units capsule Take 4,000 Units by mouth daily.    . clonazePAM (KLONOPIN) 0.5 MG tablet Take 0.5 mg by mouth 2 (two) times daily as needed for anxiety.    . gabapentin (NEURONTIN) 300 MG capsule TAKE TWO (2) CAPSULES BY MOUTH THREE TIMES DAILY. 180 capsule 1  . hydrochlorothiazide (HYDRODIURIL) 25 MG tablet Take 1 tablet (25 mg total) by mouth daily. 90 tablet 1  . metoprolol succinate (TOPROL-XL) 50 MG 24 hr tablet Take 1 tablet (50 mg total) by mouth  daily. Take with or immediately following a meal. 90 tablet 0  . Multiple Vitamin (MULTIVITAMIN) capsule Take 1 capsule by mouth daily.    . traZODone (DESYREL) 100 MG tablet Take 2 tablets by mouth every night as directed (Patient taking differently: Take 1 tablets by mouth every night as directed) 180 tablet 0  . [START ON 02/14/2019] venlafaxine XR (EFFEXOR-XR) 150 MG  24 hr capsule 225 mg daily (150 mg + 75 mg) 90 capsule 1  . venlafaxine XR (EFFEXOR-XR) 75 MG 24 hr capsule 225 mg daily (150 mg + 75 mg ) 90 capsule 1   No current facility-administered medications for this visit.      Musculoskeletal: Strength & Muscle Tone: N/A Gait & Station: N/A Patient leans: N/A  Psychiatric Specialty Exam: Review of Systems  Psychiatric/Behavioral: Positive for depression. Negative for hallucinations, memory loss, substance abuse and suicidal ideas. The patient is nervous/anxious and has insomnia.   All other systems reviewed and are negative.   There were no vitals taken for this visit.There is no height or weight on file to calculate BMI.  General Appearance: Fairly Groomed  Eye Contact:  Good  Speech:  Clear and Coherent  Volume:  Normal  Mood:  "better"  Affect:  Appropriate, Congruent and less restricted  Thought Process:  Coherent  Orientation:  Full (Time, Place, and Person)  Thought Content: Logical   Suicidal Thoughts:  No  Homicidal Thoughts:  No  Memory:  Immediate;   Good  Judgement:  Good  Insight:  Good  Psychomotor Activity:  Normal  Concentration:  Concentration: Good and Attention Span: Good  Recall:  Good  Fund of Knowledge: Good  Language: Good  Akathisia:  No  Handed:  Right  AIMS (if indicated): not done  Assets:  Communication Skills Desire for Improvement  ADL's:  Intact  Cognition: WNL  Sleep:  Fair   Screenings: GAD-7     Virtual [facility] Phone Follow Up from 10/29/2017 in [place] Phone Follow Up from 09/25/2017 in [facility] Phone Follow Up from 05/28/2017 in [place]  Total GAD-7 Score  12  17  14     PHQ2-9     Office Visit from 11/05/2018 in [place] Office Visit from 11/05/2017 in [facility] Phone Follow Up from 10/29/2017 in [facility] Nutrition from 10/15/2017 in [facility] [place] Phone Follow Up from 09/25/2017 in [facility]  PHQ-2 Total Score  2  2  6  6  6   PHQ-9 Total Score  5  5  15  19  16        Assessment and Plan:  [name] is a 63 y.o. year old female with a history of depression, meralgia paresthetica, hyperlipidemia, who presents for follow up appointment for MDD (major depressive disorder), recurrent episode, mild ([place])  # MDD, mild, recurrent without psychotic features Exam is notable for less restricted affect, and there has been steady improvement in depressive symptoms after starting bupropion.  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  Will continue venlafaxine to target depression.  We will continue bupropion as directed treatment for depression.  He has no known history of seizure.  We will continue trazodone as needed for insomnia.  She is amenable to taking lower dose of clonazepam as needed for anxiety.  Coached self compassion.  She is encouraged to continue to see a therapist.   Plan I have reviewed and updated plans as below 1.Continuevenlafaxine 225 mg daily (150 mg + 75 mg) 2. Continue bupropion 150 mg daily  3. Continue Trazodone 100 mg at night - receive from Dr. Mannie Stabile 4. Decrease Clonazepam 0.5 mg once a day as needed for anxiety - receive from Dr. Mannie Stabile  5. Next appointment:in two months - on gabapentin 600 mg three times a day  Past trials of medication:sertraline, fluoxetine, venlafaxine, xanax,  clonazepam, temazepam, Ambien  The patient demonstrates the following risk factors for suicide: Chronic risk factors for suicide include:psychiatric disorder ofdepression. Acute risk factorsfor suicide include: N/A. Protective factorsfor this patient include: coping skills and hope for the future. Considering these factors, the overall suicide risk at this point appears to below. Patientisappropriate for outpatient follow up.  [name], MD 12/30/2018, 10:50 AM

## 2018-12-30 ENCOUNTER — Encounter (HOSPITAL_COMMUNITY): Payer: Self-pay | Admitting: Psychiatry

## 2018-12-30 ENCOUNTER — Other Ambulatory Visit: Payer: Self-pay

## 2018-12-30 ENCOUNTER — Ambulatory Visit (INDEPENDENT_AMBULATORY_CARE_PROVIDER_SITE_OTHER): Payer: Commercial Managed Care - PPO | Admitting: Psychiatry

## 2018-12-30 DIAGNOSIS — F330 Major depressive disorder, recurrent, mild: Secondary | ICD-10-CM

## 2018-12-30 MED ORDER — BUPROPION HCL ER (XL) 150 MG PO TB24: 150.0000 mg | ORAL_TABLET | Freq: Every day | ORAL | 0 refills | Status: DC

## 2018-12-30 MED ORDER — VENLAFAXINE HCL ER 150 MG PO CP24: ORAL_CAPSULE | ORAL | 1 refills | Status: DC

## 2018-12-30 MED ORDER — VENLAFAXINE HCL ER 75 MG PO CP24: ORAL_CAPSULE | ORAL | 1 refills | Status: DC

## 2018-12-30 NOTE — Patient Instructions (Addendum)
1.Continuevenlafaxine 225 mg daily (150 mg + 75 mg) 2. Continue bupropion 150 mg daily  3. Continue Trazodone 100 mg at night 4. Decrease Clonazepam 0.5 mg once a day as needed for anxiety 5. Next appointment:in January

## 2019-01-06 ENCOUNTER — Ambulatory Visit: Payer: Commercial Managed Care - PPO | Admitting: Neurology

## 2019-01-11 ENCOUNTER — Other Ambulatory Visit: Payer: Self-pay

## 2019-01-11 ENCOUNTER — Ambulatory Visit (INDEPENDENT_AMBULATORY_CARE_PROVIDER_SITE_OTHER): Payer: Commercial Managed Care - PPO | Admitting: Psychiatry

## 2019-01-11 DIAGNOSIS — F331 Major depressive disorder, recurrent, moderate: Secondary | ICD-10-CM | POA: Diagnosis not present

## 2019-01-11 NOTE — Progress Notes (Signed)
Virtual Visit via Video Note  I connected with [name] on 01/11/19 at  2:00 PM EST by a video enabled telemedicine application and verified that I am speaking with the correct person using two identifiers.   I discussed the limitations of evaluation and management by telemedicine and the availability of in person appointments. The patient expressed understanding and agreed to proceed.   I provided 50  minutes of non-face-to-face time during this encounter.   [name], LCSW THERAPIST PROGRESS NOTE  Session Time:  Monday 01/11/2019 2:00 PM -  2:50 PM    Participation Level: Active  Behavioral Response: CasualAlertDepressed  Type of Therapy: Individual Therapy  Treatment Goals addressed:   Establish rapport, learn and implement cognitive and behavioral strategies to overcome depression  Interventions: Supportive, CBT  Summary: [name] is a 63 y.o. female who is referred for services by psychiatrist Dr. Modesta Messing due to patient experiencing symptoms of depression and anxiety. She denies any psychiatric hospitalizations. She reports previous involvement in outpatient treatment  at [facility] in [place] about 10 years ago due to depression.  Patient reports feelings of sadness as well as feeling bad about self.  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  Her mother has Alzheimer's and her 62 year old uncle has kidney failure.  She states feeling as though she does not have a life.  She also states seeing them deteriorate makes her sad and think about the mortality of her own life.  Patient last was seen via virtual visit about 6 weeks ago.  She reports completing project of decorating after last session.  However she reports experiencing a setback when her mother was hospitalized in October and patient experienced increased stress and worry regarding her mother's care.  Mother was discharged on October 24 and is doing  better now.  Patient reports experiencing continued depressed mood, poor motivation and lack of interest/pleasure in activities.  Patient reports continued fatigue and expresses frustration about not performing household task and projects.  She reports no suicidal ideations but reports feelings of hopelessness. She expresses frustration regarding negative impact of COVID-19 pandemic and being unable to participate in activities she used to enjoy due to restrictions.   Suicidal/Homicidal: Nowithout intent/plan  Therapist Response: Reviewed symptoms, praised and reinforced patient's completion of decorating project, discussed effects, discussed stressors, facilitated expression of thoughts and feelings, validated feelings, reviewed cycle of depression, assisted patient identify activities she has enjoyed in the past, assigned patient to develop list of activities she would like to pursue in preparation for next session, discussed rationale for and assigned patient to do a gratitude journal daily to cope with negative thoughts and hopelessness  Plan: Return again in 2 weeks.  Diagnosis: Axis I: MDD, Recurrent    Axis II: Deferred    [name], LCSW 01/11/2019

## 2019-01-28 ENCOUNTER — Ambulatory Visit (INDEPENDENT_AMBULATORY_CARE_PROVIDER_SITE_OTHER): Payer: Commercial Managed Care - PPO | Admitting: Psychiatry

## 2019-01-28 ENCOUNTER — Other Ambulatory Visit: Payer: Self-pay

## 2019-01-28 DIAGNOSIS — F331 Major depressive disorder, recurrent, moderate: Secondary | ICD-10-CM

## 2019-01-28 NOTE — Progress Notes (Signed)
Virtual Visit via Video Note  I connected with [name] on 01/28/19 at 2:10 PM -  by a video enabled telemedicine application and verified that I am speaking with the correct person using two identifiers.   I discussed the limitations of evaluation and management by telemedicine and the availability of in person appointments. The patient expressed understanding and agreed to proceed.   I provided 45 minutes of non-face-to-face time during this encounter.   [name], LCSW   THERAPIST PROGRESS NOTE  Session Time:  Thursday 01/28/2019 2:10 PM - 2:55 PM   Participation Level: Active  Behavioral Response: CasualAlertDepressed  Type of Therapy: Individual Therapy  Treatment Goals addressed:   Establish rapport, learn and implement cognitive and behavioral strategies to overcome depression  Interventions: Supportive, CBT  Summary: [name] is a 63 y.o. female who is referred for services by psychiatrist Dr. Modesta Messing due to patient experiencing symptoms of depression and anxiety. She denies any psychiatric hospitalizations. She reports previous involvement in outpatient treatment  at [facility] in [place] about 10 years ago due to depression.  Patient reports feelings of sadness as well as feeling bad about self.  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  Her mother has Alzheimer's and her 64 year old uncle has kidney failure.  She states feeling as though she does not have a life.  She also states seeing them deteriorate makes her sad and think about the mortality of her own life.  Patient last was seen via virtual visit about 3 weeks ago.  She states things have been about the same since last session. She reports making lists of tasks to complete and completing several. She also participated in decorating projects she enjoyed and plans to do more this week.  However, she reports having negative thoughts about not  completing other tasks and reports frequent use of "should, ought" thoughts. She reports she did not complete gratitude list as she concentrated on her task list.  Suicidal/Homicidal: Nowithout intent/plan  Therapist Response: Reviewed symptoms, praised and reinforced patient's completion of tasks and assisted patient identify effects, assisted patient examine her thought patterns and identify connection between thoughts/mood/behavior, assisted patient identify/challenge/replace unhelpful thoughts with helpful thoughts, developed plan with patient to complete one responsibility and one pleasurable activity per day, discussed rationale for doing a gratitude journal and provide clarification on what to include in journal, assigned patient to keep daily gratitude journal  Plan: Return again in 2 weeks.  Diagnosis: Axis I: MDD, Recurrent    Axis II: Deferred    [name], LCSW 01/28/2019

## 2019-02-10 ENCOUNTER — Ambulatory Visit (HOSPITAL_COMMUNITY): Payer: Commercial Managed Care - PPO

## 2019-02-17 ENCOUNTER — Ambulatory Visit (HOSPITAL_COMMUNITY): Payer: Commercial Managed Care - PPO | Admitting: Psychiatry

## 2019-02-18 ENCOUNTER — Ambulatory Visit (HOSPITAL_COMMUNITY): Payer: Commercial Managed Care - PPO | Admitting: Psychiatry

## 2019-02-22 ENCOUNTER — Ambulatory Visit (INDEPENDENT_AMBULATORY_CARE_PROVIDER_SITE_OTHER): Payer: Commercial Managed Care - PPO | Admitting: Psychiatry

## 2019-02-22 ENCOUNTER — Other Ambulatory Visit: Payer: Self-pay

## 2019-02-22 DIAGNOSIS — F331 Major depressive disorder, recurrent, moderate: Secondary | ICD-10-CM

## 2019-02-22 NOTE — Progress Notes (Signed)
Virtual Visit via Video Note  I connected with [name] on 02/22/19 at  3:00 PM EST by a video enabled telemedicine application and verified that I am speaking with the correct person using two identifiers.   I discussed the limitations of evaluation and management by telemedicine and the availability of in person appointments. The patient expressed understanding and agreed to proceed.   I provided 50 minutes of non-face-to-face time during this encounter.   [name], LCSW   THERAPIST PROGRESS NOTE  Session Time:  Monday 02/22/2019 3:00 PM - 3:50 PM  Participation Level: Active  Behavioral Response: CasualAlertDepressed  Type of Therapy: Individual Therapy  Treatment Goals addressed:   Establish rapport, learn and implement cognitive and behavioral strategies to overcome depression  Interventions: Supportive, CBT  Summary: [name] is a 64 y.o. female who is referred for services by psychiatrist Dr. Modesta Messing due to patient experiencing symptoms of depression and anxiety. She denies any psychiatric hospitalizations. She reports previous involvement in outpatient treatment  at [facility] in [place] about 10 years ago due to depression.  Patient reports feelings of sadness as well as feeling bad about self.  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  Her mother has Alzheimer's and her 47 year old uncle has kidney failure.  She states feeling as though she does not have a life.  She also states seeing them deteriorate makes her sad and think about the mortality of her own life.  Patient last was seen via virtual visit about 3 weeks ago.  She reports managing holidays okay. She expresses frustration as she says there is always something to do regarding chores or errands.  She has difficulty relaxing because she has constant thoughts of what she should and ought to be doing per patient's report.  She reports having feelings  of dread regarding caretaker responsibilities for her mother and uncle.  She reports she has not really participated in any pleasurable activities and continues to have difficulty balancing responsible activities with pleasurable activities.  She continues to have negative thoughts and reports frequent use of should and ought.   Suicidal/Homicidal: Nowithout intent/plan  Therapist Response: Reviewed symptoms, discussed thoughts and processes that inhibited completion of homework, introduced mindfulness, discussediscussed rationale for and assisted patient practice a mindfulness activity using breath awareness to increase patient's mindfulness skills and to stay engaged in the present, developed plan with patient to practice daily Plan: Return again in 2 weeks.  Diagnosis: Axis I: MDD, Recurrent    Axis II: Deferred    [name], LCSW 02/22/2019

## 2019-03-15 ENCOUNTER — Ambulatory Visit: Payer: Commercial Managed Care - PPO | Admitting: Psychiatry

## 2019-04-01 ENCOUNTER — Other Ambulatory Visit: Payer: Self-pay

## 2019-04-01 ENCOUNTER — Encounter: Payer: Self-pay | Admitting: Psychiatry

## 2019-04-01 ENCOUNTER — Ambulatory Visit (INDEPENDENT_AMBULATORY_CARE_PROVIDER_SITE_OTHER): Payer: Commercial Managed Care - PPO | Admitting: Psychiatry

## 2019-04-01 DIAGNOSIS — F5105 Insomnia due to other mental disorder: Secondary | ICD-10-CM | POA: Diagnosis not present

## 2019-04-01 DIAGNOSIS — F99 Mental disorder, not otherwise specified: Secondary | ICD-10-CM | POA: Diagnosis not present

## 2019-04-01 DIAGNOSIS — F419 Anxiety disorder, unspecified: Secondary | ICD-10-CM

## 2019-04-01 DIAGNOSIS — F3342 Major depressive disorder, recurrent, in full remission: Secondary | ICD-10-CM | POA: Diagnosis not present

## 2019-04-01 MED ORDER — VENLAFAXINE HCL ER 75 MG PO CP24: ORAL_CAPSULE | ORAL | 0 refills | Status: DC

## 2019-04-01 MED ORDER — CLONAZEPAM 0.5 MG PO TABS: 0.5000 mg | ORAL_TABLET | Freq: Two times a day (BID) | ORAL | 1 refills | Status: DC | PRN

## 2019-04-01 MED ORDER — TRAZODONE HCL 100 MG PO TABS: 100.0000 mg | ORAL_TABLET | Freq: Every evening | ORAL | 0 refills | Status: DC | PRN

## 2019-04-01 MED ORDER — BUPROPION HCL ER (XL) 150 MG PO TB24: 150.0000 mg | ORAL_TABLET | Freq: Every day | ORAL | 0 refills | Status: DC

## 2019-04-01 MED ORDER — VENLAFAXINE HCL ER 150 MG PO CP24: ORAL_CAPSULE | ORAL | 0 refills | Status: DC

## 2019-04-01 NOTE — Progress Notes (Signed)
[facility] MD OP Progress Note  I connected with  [name] on 04/01/19 by a video enabled telemedicine application and verified that I am speaking with the correct person using two identifiers.   I discussed the limitations of evaluation and management by telemedicine. The patient expressed understanding and agreed to proceed.    04/01/2019 11:01 AM [name]  MRN:  [id number]  Chief Complaint: " I am doing well."  HPI: Patient reported that she is doing well.  She informed that mood and anxiety have been controlled.  Wellbutrin was added to her regimen in November and since it was added she feels that her symptoms of depression and anxiety improved.  She stated that she takes trazodone on and off for sleep.  She does take 1 tablet of clonazepam every night and that helps her sleep well.  She avoids taking it during the daytime as it makes her too drowsy. She denied any acute issues or concerns at this time. She stated that she temporarily stopped seeing Ms. Peggy but now would like to return back to seeing her again.  Visit Diagnosis:    ICD-10-CM   1. MDD (major depressive disorder), recurrent, in full remission ([place])  F33.42   2. Anxiety  F41.9     Past Psychiatric History: MDD, anxiety  Past Medical History:  Past Medical History:  Diagnosis Date  . Anxiety   . Depression   . GERD (gastroesophageal reflux disease)   . Hypertension   . Meralgia paresthetica of right side 06/05/2017    Past Surgical History:  Procedure Laterality Date  . KNEE CARTILAGE SURGERY Right    Workers Compensation, fell out of chair at work.     Family Psychiatric History: See below  Family History:  Family History  Problem Relation Age of Onset  . Alzheimer's disease Mother   . Hypertension Sister   . Diabetes Maternal Uncle   . Hypertension Sister     Social History:  Social History   Socioeconomic History  . Marital status: Married    Spouse name: Not on file  . Number of children:  Not on file  . Years of education: Not on file  . Highest education level: Not on file  Occupational History  . Not on file  Tobacco Use  . Smoking status: Former Smoker    Quit date: 10/13/1983    Years since quitting: 35.4  . Smokeless tobacco: Never Used  Substance and Sexual Activity  . Alcohol use: No  . Drug use: No  . Sexual activity: Not Currently    Birth control/protection: Post-menopausal  Other Topics Concern  . Not on file  Social History Narrative   Lives   Caffeine use:    Married. 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    Spends every 3rd night at [facility].    Has been taking care of mother since 83.    Social Determinants of Health   Financial Resource Strain:   . Difficulty of Paying Living Expenses: Not on file  Food Insecurity:   . Worried About Charity fundraiser in the Last Year: Not on file  . Ran Out of Food in the Last Year: Not on file  Transportation Needs:   . Lack of Transportation (Medical): Not on file  . Lack of Transportation (Non-Medical): Not on file  Physical Activity:   . Days of Exercise per Week: Not on file  . Minutes of Exercise per Session: Not on file  Stress:   . Feeling of Stress : Not on file  Social Connections:   . Frequency of Communication with Friends and Family: Not on file  . Frequency of Social Gatherings with Friends and Family: Not on file  . Attends Religious Services: Not on file  . Active Member of Clubs or Organizations: Not on file  . Attends Archivist Meetings: Not on file  . Marital Status: Not on file    Allergies: No Known Allergies  Metabolic Disorder Labs: Lab Results  Component Value Date   HGBA1C 5.5 11/06/2016   HGBA1C 5.5 11/06/2016   MPG 111 11/06/2016   No results found for: PROLACTIN Lab Results  Component Value Date   CHOL 149 10/30/2017   TRIG 73 10/30/2017   HDL 48 (L) 10/30/2017   CHOLHDL 3.1 10/30/2017   LDLCALC 85 10/30/2017   LDLCALC 189 (H)  07/10/2017   Lab Results  Component Value Date   TSH 0.98 10/30/2017   TSH 1.23 11/06/2016   TSH 1.23 11/06/2016    Therapeutic Level Labs: No results found for: LITHIUM No results found for: VALPROATE No components found for:  CBMZ  Current Medications: Current Outpatient Medications  Medication Sig Dispense Refill  . aspirin EC 81 MG tablet Take 81 mg by mouth daily.    Marland Kitchen atorvastatin (LIPITOR) 20 MG tablet Take 1 tablet (20 mg total) by mouth daily. 90 tablet 3  . Biotin 5 MG TABS Use one po qd  0  . buPROPion (WELLBUTRIN XL) 150 MG 24 hr tablet Take 1 tablet (150 mg total) by mouth daily. 90 tablet 0  . chlorhexidine (PERIDEX) 0.12 % solution 5 mLs by Mouth Rinse route 2 (two) times daily.    . Cholecalciferol (D3-1000) 1000 units capsule Take 4,000 Units by mouth daily.    . clonazePAM (KLONOPIN) 0.5 MG tablet Take 0.5 mg by mouth 2 (two) times daily as needed for anxiety.    . gabapentin (NEURONTIN) 300 MG capsule TAKE TWO (2) CAPSULES BY MOUTH THREE TIMES DAILY. 180 capsule 1  . hydrochlorothiazide (HYDRODIURIL) 25 MG tablet Take 1 tablet (25 mg total) by mouth daily. 90 tablet 1  . metoprolol succinate (TOPROL-XL) 50 MG 24 hr tablet Take 1 tablet (50 mg total) by mouth daily. Take with or immediately following a meal. 90 tablet 0  . Multiple Vitamin (MULTIVITAMIN) capsule Take 1 capsule by mouth daily.    . traZODone (DESYREL) 100 MG tablet Take 2 tablets by mouth every night as directed (Patient taking differently: Take 1 tablets by mouth every night as directed) 180 tablet 0  . venlafaxine XR (EFFEXOR-XR) 150 MG 24 hr capsule 225 mg daily (150 mg + 75 mg) 90 capsule 1  . venlafaxine XR (EFFEXOR-XR) 75 MG 24 hr capsule 225 mg daily (150 mg + 75 mg ) 90 capsule 1   No current facility-administered medications for this visit.     Musculoskeletal: Strength & Muscle Tone: unable to assess due to telemed visit Gait & Station: unable to assess due to telemed visit Patient  leans: unable to assess due to telemed visit   Psychiatric Specialty Exam: Review of Systems  There were no vitals taken for this visit.There is no height or weight on file to calculate BMI.  General Appearance: Fairly Groomed  Eye Contact:  Good  Speech:  Clear and Coherent and Normal Rate  Volume:  Normal  Mood:  Euthymic  Affect:  Congruent  Thought Process:  Goal Directed, Linear and  Descriptions of Associations: Intact  Orientation:  Full (Time, Place, and Person)  Thought Content: Logical   Suicidal Thoughts:  No  Homicidal Thoughts:  No  Memory:  Recent;   Good Remote;   Good  Judgement:  Fair  Insight:  Fair  Psychomotor Activity:  Normal  Concentration:  Concentration: Good and Attention Span: Good  Recall:  Good  Fund of Knowledge: Good  Language: Good  Akathisia:  Negative  Handed:  Right  AIMS (if indicated): not done  Assets:  Communication Skills Desire for Improvement Financial Resources/Insurance Housing  ADL's:  Intact  Cognition: WNL  Sleep:  Good   Screenings: GAD-7     Virtual BH Phone Follow Up from 10/29/2017 in [place] [facility] Phone Follow Up from 09/25/2017 in [place] [facility] Phone Follow Up from 05/28/2017 in [place]  Total GAD-7 Score  12  17  14     PHQ2-9     Office Visit from 11/05/2018 in [facility] Office Visit from 11/05/2017 in [facility] Phone Follow Up from 10/29/2017 in [facility] Nutrition from 10/15/2017 in [facility] [place] Phone Follow Up from 09/25/2017 in [facility]  PHQ-2 Total Score  2  2  6  6  6   PHQ-9 Total Score  5  5  15  19  16        Assessment and Plan: 64 year old female with history of depression, anxiety, hypertension, HLD now seen for follow-up.  She reported doing well on her current regimen.  She denied any acute issues  or concerns at this time.  1. MDD (major depressive disorder), recurrent, in full remission ([place])  - buPROPion (WELLBUTRIN XL) 150 MG 24 hr tablet; Take 1 tablet (150 mg total) by mouth daily.  Dispense: 90 tablet; Refill: 0 - venlafaxine XR (EFFEXOR-XR) 150 MG 24 hr capsule; 225 mg daily (150 mg + 75 mg)  Dispense: 90 capsule; Refill: 0 - venlafaxine XR (EFFEXOR-XR) 75 MG 24 hr capsule; 225 mg daily (150 mg + 75 mg )  Dispense: 90 capsule; Refill: 0 - traZODone (DESYREL) 100 MG tablet; Take 1 tablet (100 mg total) by mouth at bedtime as needed for sleep. Take 1 tablets by mouth every night as directed  Dispense: 90 tablet; Refill: 0  2. Anxiety  - venlafaxine XR (EFFEXOR-XR) 150 MG 24 hr capsule; 225 mg daily (150 mg + 75 mg)  Dispense: 90 capsule; Refill: 0 - venlafaxine XR (EFFEXOR-XR) 75 MG 24 hr capsule; 225 mg daily (150 mg + 75 mg )  Dispense: 90 capsule; Refill: 0 - clonazePAM (KLONOPIN) 0.5 MG tablet; Take 1 tablet (0.5 mg total) by mouth 2 (two) times daily as needed for anxiety.  Dispense: 30 tablet; Refill: 1  Resume individual therapy with Ms.Peggy. Continue same medication regimen. Follow up in 2 months.    [place] Crane, MD 04/01/2019, 11:01 AM

## 2019-04-22 ENCOUNTER — Ambulatory Visit (HOSPITAL_COMMUNITY)
Admission: RE | Admit: 2019-04-22 | Discharge: 2019-04-22 | Disposition: A | Discharge status: Home / Self Care | Payer: Commercial Managed Care - PPO | Source: Ambulatory Visit | Attending: Family Medicine | Admitting: Family Medicine

## 2019-04-22 ENCOUNTER — Other Ambulatory Visit: Payer: Self-pay

## 2019-04-22 DIAGNOSIS — Z1231 Encounter for screening mammogram for malignant neoplasm of breast: Secondary | ICD-10-CM | POA: Diagnosis not present

## 2019-05-10 ENCOUNTER — Ambulatory Visit (HOSPITAL_COMMUNITY): Payer: Commercial Managed Care - PPO | Admitting: Psychiatry

## 2019-05-26 NOTE — Progress Notes (Signed)
Virtual Visit via Telephone Note  I connected with [name] on 05/31/19 at 11:00 AM EDT by telephone and verified that I am speaking with the correct person using two identifiers.   I discussed the limitations, risks, security and privacy concerns of performing an evaluation and management service by telephone and the availability of in person appointments. I also discussed with the patient that there may be a patient responsible charge related to this service. The patient expressed understanding and agreed to proceed.    I discussed the assessment and treatment plan with the patient. The patient was provided an opportunity to ask questions and all were answered. The patient agreed with the plan and demonstrated an understanding of the instructions.   The patient was advised to call back or seek an in-person evaluation if the symptoms worsen or if the condition fails to improve as anticipated.  I provided 16 minutes of non-face-to-face time during this encounter.   [name], MD    [facility] MD/PA/NP OP Progress Note  05/31/2019 11:23 AM [name]  MRN:  [id number]  Chief Complaint:  Chief Complaint    Depression; Follow-up     HPI:  This is a follow-up appointment for depression.  She states that the situation of care giving is the same.  She states that although she wants to care for her mother and her uncle, she feels overwhelmed, and feels frustrated that she is not having time for herself.  She has not tried stretch, nor regularly taking time for herself since the last visit. She attributes those to feeling emotionally drained.  [place] and her husband has been trying to come up with things to do together for fun. she has insomnia.  She feels fatigue.  She feels depressed.  She has fair appetite and concentration.  She denies SI. She feels anxious, tense at times.    Visit Diagnosis:    ICD-10-CM   1. MDD (major depressive disorder), recurrent episode, mild ([place])  F33.0   2.  MDD (major depressive disorder), recurrent, in full remission ([place])  F33.42 traZODone (DESYREL) 100 MG tablet    venlafaxine XR (EFFEXOR-XR) 150 MG 24 hr capsule    venlafaxine XR (EFFEXOR-XR) 75 MG 24 hr capsule  3. Anxiety  F41.9 venlafaxine XR (EFFEXOR-XR) 150 MG 24 hr capsule    venlafaxine XR (EFFEXOR-XR) 75 MG 24 hr capsule    clonazePAM (KLONOPIN) 0.5 MG tablet    Past Psychiatric History: Please see initial evaluation for full details. I have reviewed the history. No updates at this time.     Past Medical History:  Past Medical History:  Diagnosis Date  . Anxiety   . Depression   . GERD (gastroesophageal reflux disease)   . Hypertension   . Meralgia paresthetica of right side 06/05/2017    Past Surgical History:  Procedure Laterality Date  . KNEE CARTILAGE SURGERY Right    Workers Compensation, fell out of chair at work.     Family Psychiatric History: Please see initial evaluation for full details. I have reviewed the history. No updates at this time.     Family History:  Family History  Problem Relation Age of Onset  . Alzheimer's disease Mother   . Hypertension Sister   . Diabetes Maternal Uncle   . Hypertension Sister     Social History:  Social History   Socioeconomic History  . Marital status: Married    Spouse name: Not on file  . Number of children: Not on file  .  Years of education: Not on file  . Highest education level: Not on file  Occupational History  . Not on file  Tobacco Use  . Smoking status: Former Smoker    Quit date: 10/13/1983    Years since quitting: 35.6  . Smokeless tobacco: Never Used  Substance and Sexual Activity  . Alcohol use: No  . Drug use: No  . Sexual activity: Not Currently    Birth control/protection: Post-menopausal  Other Topics Concern  . Not on file  Social History Narrative   Lives   Caffeine use:    Married. 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    Spends every 3rd night at [employer].     Has been taking care of mother since 40.    Social Determinants of Health   Financial Resource Strain:   . Difficulty of Paying Living Expenses:   Food Insecurity:   . Worried About Charity fundraiser in the Last Year:   . Arboriculturist in the Last Year:   Transportation Needs:   . Film/video editor (Medical):   Marland Kitchen Lack of Transportation (Non-Medical):   Physical Activity:   . Days of Exercise per Week:   . Minutes of Exercise per Session:   Stress:   . Feeling of Stress :   Social Connections:   . Frequency of Communication with Friends and Family:   . Frequency of Social Gatherings with Friends and Family:   . Attends Religious Services:   . Active Member of Clubs or Organizations:   . Attends Archivist Meetings:   Marland Kitchen Marital Status:     Allergies: No Known Allergies  Metabolic Disorder Labs: Lab Results  Component Value Date   HGBA1C 5.5 11/06/2016   HGBA1C 5.5 11/06/2016   MPG 111 11/06/2016   No results found for: PROLACTIN Lab Results  Component Value Date   CHOL 149 10/30/2017   TRIG 73 10/30/2017   HDL 48 (L) 10/30/2017   CHOLHDL 3.1 10/30/2017   LDLCALC 85 10/30/2017   LDLCALC 189 (H) 07/10/2017   Lab Results  Component Value Date   TSH 0.98 10/30/2017   TSH 1.23 11/06/2016   TSH 1.23 11/06/2016    Therapeutic Level Labs: No results found for: LITHIUM No results found for: VALPROATE No components found for:  CBMZ  Current Medications: Current Outpatient Medications  Medication Sig Dispense Refill  . aspirin EC 81 MG tablet Take 81 mg by mouth daily.    Marland Kitchen atorvastatin (LIPITOR) 20 MG tablet Take 1 tablet (20 mg total) by mouth daily. 90 tablet 3  . Biotin 5 MG TABS Use one po qd  0  . buPROPion (WELLBUTRIN XL) 150 MG 24 hr tablet Take 1 tablet (150 mg total) by mouth daily. 90 tablet 0  . buPROPion (WELLBUTRIN XL) 300 MG 24 hr tablet Take 1 tablet (300 mg total) by mouth daily. 30 tablet 1  . chlorhexidine (PERIDEX) 0.12 %  solution 5 mLs by Mouth Rinse route 2 (two) times daily.    . Cholecalciferol (D3-1000) 1000 units capsule Take 4,000 Units by mouth daily.    Derrill Memo ON 06/05/2019] clonazePAM (KLONOPIN) 0.5 MG tablet Take 1 tablet (0.5 mg total) by mouth daily as needed for anxiety. 30 tablet 1  . gabapentin (NEURONTIN) 300 MG capsule TAKE TWO (2) CAPSULES BY MOUTH THREE TIMES DAILY. 180 capsule 1  . hydrochlorothiazide (HYDRODIURIL) 25 MG tablet Take 1 tablet (25 mg total) by mouth daily.  90 tablet 1  . metoprolol succinate (TOPROL-XL) 50 MG 24 hr tablet Take 1 tablet (50 mg total) by mouth daily. Take with or immediately following a meal. 90 tablet 0  . Multiple Vitamin (MULTIVITAMIN) capsule Take 1 capsule by mouth daily.    Derrill Memo ON 06/28/2019] traZODone (DESYREL) 100 MG tablet Take 1 tablet (100 mg total) by mouth at bedtime as needed for sleep. Take 1 tablets by mouth every night as directed 90 tablet 0  . [START ON 06/29/2019] venlafaxine XR (EFFEXOR-XR) 150 MG 24 hr capsule 225 mg daily (150 mg + 75 mg) 90 capsule 0  . [START ON 06/28/2019] venlafaxine XR (EFFEXOR-XR) 75 MG 24 hr capsule 225 mg daily (150 mg + 75 mg ) 90 capsule 0   No current facility-administered medications for this visit.     Musculoskeletal: Strength & Muscle Tone: N/A Gait & Station: N/A Patient leans: N/A  Psychiatric Specialty Exam: Review of Systems  Psychiatric/Behavioral: Positive for dysphoric mood and sleep disturbance. Negative for agitation, behavioral problems, confusion, decreased concentration, hallucinations, self-injury and suicidal ideas. The patient is nervous/anxious. The patient is not hyperactive.   All other systems reviewed and are negative.   There were no vitals taken for this visit.There is no height or weight on file to calculate BMI.  General Appearance: NA  Eye Contact:  NA  Speech:  Clear and Coherent  Volume:  Normal  Mood:  Depressed  Affect:  NA  Thought Process:  Coherent  Orientation:   Full (Time, Place, and Person)  Thought Content: Logical   Suicidal Thoughts:  No  Homicidal Thoughts:  No  Memory:  Immediate;   Good  Judgement:  Good  Insight:  Good  Psychomotor Activity:  Normal  Concentration:  Concentration: Good and Attention Span: Good  Recall:  Good  Fund of Knowledge: Good  Language: Good  Akathisia:  No  Handed:  Right  AIMS (if indicated): not done  Assets:  Communication Skills Desire for Improvement  ADL's:  Intact  Cognition: WNL  Sleep:  Poor   Screenings: GAD-7     Virtual BH Phone Follow Up from 10/29/2017 in [place] [facility] Phone Follow Up from 09/25/2017 in [place] [facility] Phone Follow Up from 05/28/2017 in [place]  Total GAD-7 Score  12  17  14     PHQ2-9     Office Visit from 11/05/2018 in [place] Office Visit from 11/05/2017 in [facility] Phone Follow Up from 10/29/2017 in [facility] Nutrition from 10/15/2017 in [facility] [place] Phone Follow Up from 09/25/2017 in [facility]  PHQ-2 Total Score  2  2  6  6  6   PHQ-9 Total Score  5  5  15  19  16        Assessment and Plan:  [name] is a 64 y.o. year old female with a history of depression, meralgia paresthetica, hyperlipidemia, , who presents for follow up appointment for MDD (major depressive disorder), recurrent episode, mild ([facility])  MDD (major depressive disorder), recurrent, in full remission ([place]) - Plan: traZODone (DESYREL) 100 MG tablet, venlafaxine XR (EFFEXOR-XR) 150 MG 24 hr capsule, venlafaxine XR (EFFEXOR-XR) 75 MG 24 hr capsule  Anxiety - Plan: venlafaxine XR (EFFEXOR-XR) 150 MG 24 hr capsule, venlafaxine XR (EFFEXOR-XR) 75 MG 24 hr capsule, clonazePAM (KLONOPIN) 0.5 MG tablet  # MDD, mild, recurrent without psychotic features  She continues to report depressive symptoms and  prominent anhedonia since the last visit. 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   We uptitrate bupropion to optimize its effect for depression. Discussed potential risk of headache and worsening in anxiety. She has no known history of seizure.  Will continue venlafaxine for depression.  We will continue trazodone as needed for insomnia.  Will continue clonazepam as needed for anxiety.  Coached self compassion.   Plan I have reviewed and updated plans as below 1.Continuevenlafaxine 225 mg daily (150 mg + 75 mg) 2. Increase bupropion 300 mg daily  3. Continue Trazodone 100 mg at night  4. Continue Clonazepam 0.5 mg once a day as needed for anxiety  5.Next appointment:5/24 at 10:50 for 20 mins, video - on gabapentin 600 mg three times a day  Past trials of medication:sertraline, fluoxetine, venlafaxine, xanax, clonazepam, temazepam, Ambien  The patient demonstrates the following risk factors for suicide: Chronic risk factors for suicide include:psychiatric disorder ofdepression. Acute risk factorsfor suicide include: N/A. Protective factorsfor this patient include: coping skills and hope for the future. Considering these factors, the overall suicide risk at this point appears to below. Patientisappropriate for outpatient follow up.   [name], MD 05/31/2019, 11:23 AM

## 2019-05-31 ENCOUNTER — Ambulatory Visit (INDEPENDENT_AMBULATORY_CARE_PROVIDER_SITE_OTHER): Payer: Commercial Managed Care - PPO | Admitting: Psychiatry

## 2019-05-31 ENCOUNTER — Encounter (HOSPITAL_COMMUNITY): Payer: Self-pay | Admitting: Psychiatry

## 2019-05-31 ENCOUNTER — Other Ambulatory Visit: Payer: Self-pay

## 2019-05-31 DIAGNOSIS — F3342 Major depressive disorder, recurrent, in full remission: Secondary | ICD-10-CM

## 2019-05-31 DIAGNOSIS — F330 Major depressive disorder, recurrent, mild: Secondary | ICD-10-CM | POA: Diagnosis not present

## 2019-05-31 DIAGNOSIS — F419 Anxiety disorder, unspecified: Secondary | ICD-10-CM

## 2019-05-31 MED ORDER — CLONAZEPAM 0.5 MG PO TABS: 0.5000 mg | ORAL_TABLET | Freq: Every day | ORAL | 1 refills | Status: DC | PRN

## 2019-05-31 MED ORDER — BUPROPION HCL ER (XL) 300 MG PO TB24: 300.0000 mg | ORAL_TABLET | Freq: Every day | ORAL | 1 refills | Status: DC

## 2019-05-31 MED ORDER — TRAZODONE HCL 100 MG PO TABS: 100.0000 mg | ORAL_TABLET | Freq: Every evening | ORAL | 0 refills | Status: DC | PRN

## 2019-05-31 MED ORDER — VENLAFAXINE HCL ER 75 MG PO CP24: ORAL_CAPSULE | ORAL | 0 refills | Status: DC

## 2019-05-31 MED ORDER — VENLAFAXINE HCL ER 150 MG PO CP24: ORAL_CAPSULE | ORAL | 0 refills | Status: DC

## 2019-05-31 NOTE — Patient Instructions (Signed)
1.Continuevenlafaxine 225 mg daily (150 mg + 75 mg) 2. Increase bupropion 300 mg daily  3. Continue Trazodone 100 mg at night - receive from Dr. Mannie Stabile 4. Continue Clonazepam 0.5 mg once a day as needed for anxiety  5.Next appointment:5/24 at 10:50

## 2019-06-29 ENCOUNTER — Ambulatory Visit (HOSPITAL_COMMUNITY)
Admission: RE | Admit: 2019-06-29 | Discharge: 2019-06-29 | Disposition: A | Discharge status: Home / Self Care | Payer: Commercial Managed Care - PPO | Source: Ambulatory Visit | Attending: Family Medicine | Admitting: Family Medicine

## 2019-06-29 ENCOUNTER — Other Ambulatory Visit (HOSPITAL_COMMUNITY): Payer: Self-pay | Admitting: Family Medicine

## 2019-06-29 ENCOUNTER — Other Ambulatory Visit: Payer: Self-pay

## 2019-06-29 DIAGNOSIS — M25532 Pain in left wrist: Secondary | ICD-10-CM | POA: Diagnosis present

## 2019-07-05 NOTE — Progress Notes (Signed)
Virtual Visit via Video Note  I connected with [name] on 07/12/19 at 10:50 AM EDT by a video enabled telemedicine application and verified that I am speaking with the correct person using two identifiers.   I discussed the limitations of evaluation and management by telemedicine and the availability of in person appointments. The patient expressed understanding and agreed to proceed.    I discussed the assessment and treatment plan with the patient. The patient was provided an opportunity to ask questions and all were answered. The patient agreed with the plan and demonstrated an understanding of the instructions.   The patient was advised to call back or seek an in-person evaluation if the symptoms worsen or if the condition fails to improve as anticipated.  Location: patient- home, provider- office   I provided 15 minutes of non-face-to-face time during this encounter.   [name], MD     [facility] MD/PA/NP OP Progress Note  07/12/2019 11:06 AM [name]  MRN:  [id number]  Chief Complaint:  Chief Complaint    Depression; Follow-up     HPI:  This is a follow-up appointment for depression.  She states that although nothing changed in her situation, she feels less depressed since up titration of bupropion. She has started gardening, and enjoys the result. She hopes to de clutter the house, and agrees to try it every day rather than trying to complete it in one day. She always does something, including cleaning the house.  Her mother, and her uncle did not have any major issues.  Turner and her 2 sisters take care of them alternatively.  She denies insomnia after starting trazodone.  She feels less depressed.  She has fair motivation and energy.  She has good appetite.  She denies SI.  She denies anxiety or panic attacks.  Although she enjoys talking in therapy, she felt pressured as she was unable to meet goals. She feels comfortable sharing it with Ms. Bynum.   Visit Diagnosis:     ICD-10-CM   1. MDD (major depressive disorder), recurrent episode, mild ([place])  F33.0   2. Anxiety  F41.9 clonazePAM (KLONOPIN) 0.5 MG tablet    Past Psychiatric History: Please see initial evaluation for full details. I have reviewed the history. No updates at this time.     Past Medical History:  Past Medical History:  Diagnosis Date  . Anxiety   . Depression   . GERD (gastroesophageal reflux disease)   . Hypertension   . Meralgia paresthetica of right side 06/05/2017    Past Surgical History:  Procedure Laterality Date  . KNEE CARTILAGE SURGERY Right    Workers Compensation, fell out of chair at work.     Family Psychiatric History: Please see initial evaluation for full details. I have reviewed the history. No updates at this time.     Family History:  Family History  Problem Relation Age of Onset  . Alzheimer's disease Mother   . Hypertension Sister   . Diabetes Maternal Uncle   . Hypertension Sister     Social History:  Social History   Socioeconomic History  . Marital status: Married    Spouse name: Not on file  . Number of children: Not on file  . Years of education: Not on file  . Highest education level: Not on file  Occupational History  . Not on file  Tobacco Use  . Smoking status: Former Smoker    Quit date: 10/13/1983    Years since quitting: 35.7  .  Smokeless tobacco: Never Used  Substance and Sexual Activity  . Alcohol use: No  . Drug use: No  . Sexual activity: Not Currently    Birth control/protection: Post-menopausal  Other Topics Concern  . Not on file  Social History Narrative   Lives   Caffeine use:    Married. 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    Spends every 3rd night at [employer].    Has been taking care of mother since 59.    Social Determinants of Health   Financial Resource Strain:   . Difficulty of Paying Living Expenses:   Food Insecurity:   . Worried About Charity fundraiser in the Last  Year:   . Arboriculturist in the Last Year:   Transportation Needs:   . Film/video editor (Medical):   Marland Kitchen Lack of Transportation (Non-Medical):   Physical Activity:   . Days of Exercise per Week:   . Minutes of Exercise per Session:   Stress:   . Feeling of Stress :   Social Connections:   . Frequency of Communication with Friends and Family:   . Frequency of Social Gatherings with Friends and Family:   . Attends Religious Services:   . Active Member of Clubs or Organizations:   . Attends Archivist Meetings:   Marland Kitchen Marital Status:     Allergies: No Known Allergies  Metabolic Disorder Labs: Lab Results  Component Value Date   HGBA1C 5.5 11/06/2016   HGBA1C 5.5 11/06/2016   MPG 111 11/06/2016   No results found for: PROLACTIN Lab Results  Component Value Date   CHOL 149 10/30/2017   TRIG 73 10/30/2017   HDL 48 (L) 10/30/2017   CHOLHDL 3.1 10/30/2017   LDLCALC 85 10/30/2017   LDLCALC 189 (H) 07/10/2017   Lab Results  Component Value Date   TSH 0.98 10/30/2017   TSH 1.23 11/06/2016   TSH 1.23 11/06/2016    Therapeutic Level Labs: No results found for: LITHIUM No results found for: VALPROATE No components found for:  CBMZ  Current Medications: Current Outpatient Medications  Medication Sig Dispense Refill  . aspirin EC 81 MG tablet Take 81 mg by mouth daily.    Marland Kitchen atorvastatin (LIPITOR) 20 MG tablet Take 1 tablet (20 mg total) by mouth daily. 90 tablet 3  . Biotin 5 MG TABS Use one po qd  0  . buPROPion (WELLBUTRIN XL) 300 MG 24 hr tablet Take 1 tablet (300 mg total) by mouth daily. 90 tablet 0  . chlorhexidine (PERIDEX) 0.12 % solution 5 mLs by Mouth Rinse route 2 (two) times daily.    . Cholecalciferol (D3-1000) 1000 units capsule Take 4,000 Units by mouth daily.    Derrill Memo ON 08/04/2019] clonazePAM (KLONOPIN) 0.5 MG tablet Take 1 tablet (0.5 mg total) by mouth daily as needed for anxiety. 30 tablet 1  . gabapentin (NEURONTIN) 300 MG capsule TAKE  TWO (2) CAPSULES BY MOUTH THREE TIMES DAILY. 180 capsule 1  . hydrochlorothiazide (HYDRODIURIL) 25 MG tablet Take 1 tablet (25 mg total) by mouth daily. 90 tablet 1  . metoprolol succinate (TOPROL-XL) 50 MG 24 hr tablet Take 1 tablet (50 mg total) by mouth daily. Take with or immediately following a meal. 90 tablet 0  . Multiple Vitamin (MULTIVITAMIN) capsule Take 1 capsule by mouth daily.    . traZODone (DESYREL) 100 MG tablet Take 1 tablet (100 mg total) by mouth at bedtime as needed for sleep. Take  1 tablets by mouth every night as directed 90 tablet 0  . venlafaxine XR (EFFEXOR-XR) 150 MG 24 hr capsule 225 mg daily (150 mg + 75 mg) 90 capsule 0  . venlafaxine XR (EFFEXOR-XR) 75 MG 24 hr capsule 225 mg daily (150 mg + 75 mg ) 90 capsule 0   No current facility-administered medications for this visit.     Musculoskeletal: Strength & Muscle Tone: N/A Gait & Station: N/A Patient leans: N/A  Psychiatric Specialty Exam: Review of Systems  Psychiatric/Behavioral: Positive for dysphoric mood. Negative for agitation, behavioral problems, confusion, decreased concentration, hallucinations, self-injury, sleep disturbance and suicidal ideas. The patient is nervous/anxious. The patient is not hyperactive.   All other systems reviewed and are negative.   There were no vitals taken for this visit.There is no height or weight on file to calculate BMI.  General Appearance: Fairly Groomed  Eye Contact:  Good  Speech:  Clear and Coherent  Volume:  Normal  Mood:  better  Affect:  Appropriate, Congruent and euthymic  Thought Process:  Coherent  Orientation:  Full (Time, Place, and Person)  Thought Content: Logical   Suicidal Thoughts:  No  Homicidal Thoughts:  No  Memory:  Immediate;   Good  Judgement:  Good  Insight:  Good  Psychomotor Activity:  Normal  Concentration:  Concentration: Good and Attention Span: Good  Recall:  Good  Fund of Knowledge: Good  Language: Good  Akathisia:  No   Handed:  Right  AIMS (if indicated): not done  Assets:  Communication Skills Desire for Improvement  ADL's:  Intact  Cognition: WNL  Sleep:  Good   Screenings: GAD-7     Virtual [facility] Phone Follow Up from 10/29/2017 in [place] Phone Follow Up from 09/25/2017 in [facility] Phone Follow Up from 05/28/2017 in [place]  Total GAD-7 Score  12  17  14     PHQ2-9     Office Visit from 11/05/2018 in [place] Office Visit from 11/05/2017 in [facility] Phone Follow Up from 10/29/2017 in [facility] Nutrition from 10/15/2017 in [facility] [place] Phone Follow Up from 09/25/2017 in [facility]  PHQ-2 Total Score  2  2  6  6  6   PHQ-9 Total Score  5  5  15  19  16        Assessment and Plan:  Mercede Odenthal is a 64 y.o. year old female with a history of depression,meralgia paresthetica, hyperlipidemia , who presents for follow up appointment for MDD (major depressive disorder), recurrent episode, mild ([place])  Anxiety - Plan: clonazePAM (KLONOPIN) 0.5 MG tablet  1. MDD (major depressive disorder), recurrent episode, mild ([place]) She reports overall improvement since up titration of bupropion. 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 Will continue current medication regimen.  Will continue venlafaxine to target depression.  Will continue bupropion as adjunctive treatment for depression.  She has no known history of seizure.  Will continue trazodone as needed for insomnia.  Will continue clonazepam for anxiety; she is advise to hold this medication if possible.  Coached behavioral activation.    Plan I have reviewed and updated plans as below 1.Continuevenlafaxine 225 mg daily (150 mg + 75 mg) 2.Continuebupropion 300  mg daily  3. Continue Trazodone 100 mg at night 4.Continue Clonazepam 0.5 mg once  a day as needed for anxiety  - she agreed to hold taking it if able 5.Next appointment:7/8 at 10 AM for 20 mins, video - She will contact for therapy follow up - on gabapentin 600 mg three times a day  Past trials of medication:sertraline, fluoxetine, venlafaxine, xanax, clonazepam, temazepam, Ambien  The patient demonstrates the following risk factors for suicide: Chronic risk factors for suicide include:psychiatric disorder ofdepression. Acute risk factorsfor suicide include: N/A. Protective factorsfor this patient include: coping skills and hope for the future. Considering these factors, the overall suicide risk at this point appears to below. Patientisappropriate for outpatient follow up.   [name], MD 07/12/2019, 11:06 AM

## 2019-07-12 ENCOUNTER — Telehealth (INDEPENDENT_AMBULATORY_CARE_PROVIDER_SITE_OTHER): Payer: Commercial Managed Care - PPO | Admitting: Psychiatry

## 2019-07-12 ENCOUNTER — Encounter (HOSPITAL_COMMUNITY): Payer: Self-pay | Admitting: Psychiatry

## 2019-07-12 ENCOUNTER — Other Ambulatory Visit: Payer: Self-pay

## 2019-07-12 DIAGNOSIS — F419 Anxiety disorder, unspecified: Secondary | ICD-10-CM | POA: Diagnosis not present

## 2019-07-12 DIAGNOSIS — F330 Major depressive disorder, recurrent, mild: Secondary | ICD-10-CM

## 2019-07-12 MED ORDER — CLONAZEPAM 0.5 MG PO TABS: 0.5000 mg | ORAL_TABLET | Freq: Every day | ORAL | 1 refills | Status: DC | PRN

## 2019-07-12 MED ORDER — BUPROPION HCL ER (XL) 300 MG PO TB24: 300.0000 mg | ORAL_TABLET | Freq: Every day | ORAL | 0 refills | Status: DC

## 2019-07-12 NOTE — Patient Instructions (Signed)
1.Continuevenlafaxine 225 mg daily (150 mg + 75 mg) 2.Continuebupropion 300 mg daily  3. Continue Trazodone 100 mg at night 4.Continue Clonazepam 0.5 mg once a day as needed for anxiety   5.Next appointment:7/8 at 10 AM

## 2019-07-15 ENCOUNTER — Other Ambulatory Visit: Payer: Self-pay

## 2019-07-15 ENCOUNTER — Ambulatory Visit (INDEPENDENT_AMBULATORY_CARE_PROVIDER_SITE_OTHER): Payer: Commercial Managed Care - PPO | Admitting: Orthopaedic Surgery

## 2019-07-15 ENCOUNTER — Encounter: Payer: Self-pay | Admitting: Orthopaedic Surgery

## 2019-07-15 VITALS — BP 148/93 | HR 72 | Ht 60.0 in | Wt 186.0 lb

## 2019-07-15 DIAGNOSIS — M67432 Ganglion, left wrist: Secondary | ICD-10-CM

## 2019-07-15 DIAGNOSIS — M778 Other enthesopathies, not elsewhere classified: Secondary | ICD-10-CM | POA: Diagnosis not present

## 2019-07-15 NOTE — Progress Notes (Signed)
Office Visit Note   Patient: [name]           Date of Birth: [birth year]           MRN: [id number] Visit Date: 07/15/2019              Requested by: Caren Macadam, MD [address] [place],  [place] [address] PCP: Caren Macadam, MD   Assessment & Plan: Visit Diagnoses:  1. Ganglion of left wrist   2. Left wrist tendonitis            Ganglion 1st dorsal compartment   Plan: Passes were made with a 22 needle.  Ganglion fluid was squeezed out after removal of the needle.  We will check her back again in 6 weeks.  We discussed first dorsal compartment release and ganglion removal if she has persistent or recurrent problems.  Radial thumb splint applied that she can use with lifting.  Follow-Up Instructions: Return in about 6 weeks (around 08/26/2019).   Orders:  Orders Placed This Encounter  Procedures  . Hand/UE Inj: L extensor compartment 1   No orders of the defined types were placed in this encounter.     Procedures: Hand/UE Inj: L extensor compartment 1 for de Quervain's tenosynovitis on 07/15/2019 4:02 PM Medications: 0.3 mL lidocaine 1 %      Clinical Data: No additional findings.   Subjective: Chief Complaint  Patient presents with  . Left Wrist - Pain    HPI 64 year old female with small mass over the left first dorsal compartment.  She does have past history of left carpal tunnel syndrome.  Patient does some pulling lifting as a caregiver.  She has had previous x-rays done on 06/30/2018 one of the left wrist which were normal.  Review of Systems review of system positive for hypertension history of depression.  Carpal tunnel syndrome.  Otherwise negative is obtains HPI.   Objective: Vital Signs: BP (!) 148/93   Pulse 72   Ht 5' (1.524 m)   Wt 186 lb (84.4 kg)   BMI 36.33 kg/m   Physical Exam Constitutional:      Appearance: She is well-developed.  HENT:     Head: Normocephalic.     Right Ear: External ear normal.     Left Ear: External  ear normal.  Eyes:     Pupils: Pupils are equal, round, and reactive to light.  Neck:     Thyroid: No thyromegaly.     Trachea: No tracheal deviation.  Cardiovascular:     Rate and Rhythm: Normal rate.  Pulmonary:     Effort: Pulmonary effort is normal.  Abdominal:     Palpations: Abdomen is soft.  Skin:    General: Skin is warm and dry.  Neurological:     Mental Status: She is alert and oriented to person, place, and time.  Psychiatric:        Behavior: Behavior normal.     Ortho Exam palpable ganglion first dorsal compartment left wrist.  No thenar atrophy.  Negative Finkelstein test.  Negative sick first [place] grind test.  Normal sensation of the fingertips.  Specialty Comments:  No specialty comments available.  Imaging: No results found.   PMFS History: Patient Active Problem List   Diagnosis Date Noted  . Left wrist tendonitis 07/15/2019  . MDD (major depressive disorder), recurrent, in full remission ([place]) 04/01/2019  . Benign lipomatous neoplasm of skin, subcu of right leg 06/27/2017  . Meralgia paresthetica of right side  06/05/2017  . Sciatica of right side 01/29/2017  . Anxiety 11/13/2016  . Need for immunization against influenza 11/13/2016  . Hyperlipidemia LDL goal <100 11/13/2016  . Insomnia 11/13/2016  . HTN, goal below 140/90 11/13/2016   Past Medical History:  Diagnosis Date  . Anxiety   . Depression   . GERD (gastroesophageal reflux disease)   . Hypertension   . Meralgia paresthetica of right side 06/05/2017    Family History  Problem Relation Age of Onset  . Alzheimer's disease Mother   . Hypertension Sister   . Diabetes Maternal Uncle   . Hypertension Sister     Past Surgical History:  Procedure Laterality Date  . KNEE CARTILAGE SURGERY Right    Workers Compensation, fell out of chair at work.    Social History   Occupational History  . Not on file  Tobacco Use  . Smoking status: Former Smoker    Quit date: 10/13/1983    Years  since quitting: 35.7  . Smokeless tobacco: Never Used  Substance and Sexual Activity  . Alcohol use: No  . Drug use: No  . Sexual activity: Not Currently    Birth control/protection: Post-menopausal

## 2019-07-20 MED ORDER — LIDOCAINE HCL 1 % IJ SOLN
0.3000 mL | INTRAMUSCULAR | Status: AC | PRN
  Administered 2019-07-15: .3 mL

## 2019-08-12 ENCOUNTER — Ambulatory Visit: Payer: Commercial Managed Care - PPO | Admitting: Orthopaedic Surgery

## 2019-08-12 ENCOUNTER — Other Ambulatory Visit: Payer: Self-pay

## 2019-08-18 NOTE — Progress Notes (Signed)
Virtual Visit via Video Note  I connected with [name] on 08/26/19 at 10:00 AM EDT by a video enabled telemedicine application and verified that I am speaking with the correct person using two identifiers.   I discussed the limitations of evaluation and management by telemedicine and the availability of in person appointments. The patient expressed understanding and agreed to proceed.     I discussed the assessment and treatment plan with the patient. The patient was provided an opportunity to ask questions and all were answered. The patient agreed with the plan and demonstrated an understanding of the instructions.   The patient was advised to call back or seek an in-person evaluation if the symptoms worsen or if the condition fails to improve as anticipated.  Location: patient- home, provider- home office   I provided 12 minutes of non-face-to-face time during this encounter.   Renee Clay, MD    [facility] MD/PA/NP OP Progress Note  08/26/2019 10:20 AM [name]  MRN:  [id number]  Chief Complaint:  Chief Complaint    Follow-up; Depression     HPI:  This is a follow-up appointment for depression.  She states that things has been challenging for the patient.  Her mother was in the hospital due to [facility].  Although she was discharged to home, the patient and her sister have been taking care of pressure ulcer.  They will dressed the wound every day. Although the home nurse made positive comment, it has been stressful.  She also feels depressed a few times per week especially when she felt that she does not have enough time to do things.  Although she sleeps better with trazodone, she hopes to sleep longer; she needs to bring her uncle to dialysis appointment.  She has been enjoying working on [employer] garden.  She has fair concentration.  She has good appetite.  She denies any weight change.  She denies SI.  She feels anxious and tense at times.  She denies panic attacks.  She takes  clonazepam a few times per week for anxiety/insomnia, although she has been able to reduce the frequency since the last visit.  She denies panic attack.    Visit Diagnosis:    ICD-10-CM   1. MDD (major depressive disorder), recurrent episode, mild ([place])  F33.0   2. Anxiety  F41.9 venlafaxine XR (EFFEXOR-XR) 150 MG 24 hr capsule    venlafaxine XR (EFFEXOR-XR) 75 MG 24 hr capsule  3. Insomnia, unspecified type  G47.00 traZODone (DESYREL) 100 MG tablet    venlafaxine XR (EFFEXOR-XR) 150 MG 24 hr capsule    venlafaxine XR (EFFEXOR-XR) 75 MG 24 hr capsule    Past Psychiatric History: Please see initial evaluation for full details. I have reviewed the history. No updates at this time.     Past Medical History:  Past Medical History:  Diagnosis Date  . Anxiety   . Depression   . GERD (gastroesophageal reflux disease)   . Hypertension   . Meralgia paresthetica of right side 06/05/2017    Past Surgical History:  Procedure Laterality Date  . KNEE CARTILAGE SURGERY Right    Workers Compensation, fell out of chair at work.     Family Psychiatric History: Please see initial evaluation for full details. I have reviewed the history. No updates at this time.     Family History:  Family History  Problem Relation Age of Onset  . Alzheimer's disease Mother   . Hypertension Sister   . Diabetes Maternal Uncle   .  Hypertension Sister     Social History:  Social History   Socioeconomic History  . Marital status: Married    Spouse name: Not on file  . Number of children: Not on file  . Years of education: Not on file  . Highest education level: Not on file  Occupational History  . Not on file  Tobacco Use  . Smoking status: Former Smoker    Quit date: 10/13/1983    Years since quitting: 35.8  . Smokeless tobacco: Never Used  Substance and Sexual Activity  . Alcohol use: No  . Drug use: No  . Sexual activity: Not Currently    Birth control/protection: Post-menopausal  Other  Topics Concern  . Not on file  Social History Narrative   Lives   Caffeine use:    Married. 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    Spends every 3rd night at [facility] [employer].    Has been taking care of mother since 49.    Social Determinants of Health   Financial Resource Strain:   . Difficulty of Paying Living Expenses:   Food Insecurity:   . Worried About Charity fundraiser in the Last Year:   . Arboriculturist in the Last Year:   Transportation Needs:   . Film/video editor (Medical):   Marland Kitchen Lack of Transportation (Non-Medical):   Physical Activity:   . Days of Exercise per Week:   . Minutes of Exercise per Session:   Stress:   . Feeling of Stress :   Social Connections:   . Frequency of Communication with Friends and Family:   . Frequency of Social Gatherings with Friends and Family:   . Attends Religious Services:   . Active Member of Clubs or Organizations:   . Attends Archivist Meetings:   Marland Kitchen Marital Status:     Allergies: No Known Allergies  Metabolic Disorder Labs: Lab Results  Component Value Date   HGBA1C 5.5 11/06/2016   HGBA1C 5.5 11/06/2016   MPG 111 11/06/2016   No results found for: PROLACTIN Lab Results  Component Value Date   CHOL 149 10/30/2017   TRIG 73 10/30/2017   HDL 48 (L) 10/30/2017   CHOLHDL 3.1 10/30/2017   LDLCALC 85 10/30/2017   LDLCALC 189 (H) 07/10/2017   Lab Results  Component Value Date   TSH 0.98 10/30/2017   TSH 1.23 11/06/2016   TSH 1.23 11/06/2016    Therapeutic Level Labs: No results found for: LITHIUM No results found for: VALPROATE No components found for:  CBMZ  Current Medications: Current Outpatient Medications  Medication Sig Dispense Refill  . aspirin EC 81 MG tablet Take 81 mg by mouth daily.    Marland Kitchen atorvastatin (LIPITOR) 20 MG tablet Take 1 tablet (20 mg total) by mouth daily. 90 tablet 3  . Biotin 5 MG TABS Use one po qd  0  . buPROPion (WELLBUTRIN XL) 150 MG 24 hr tablet  Total of 450 mg daily, along with 300 mg tab 30 tablet 1  . buPROPion (WELLBUTRIN XL) 300 MG 24 hr tablet Take 1 tablet (300 mg total) by mouth daily. 90 tablet 0  . chlorhexidine (PERIDEX) 0.12 % solution 5 mLs by Mouth Rinse route 2 (two) times daily.    . Cholecalciferol (D3-1000) 1000 units capsule Take 4,000 Units by mouth daily.    . clonazePAM (KLONOPIN) 0.5 MG tablet Take 1 tablet (0.5 mg total) by mouth daily as needed for anxiety. 30 tablet  1  . gabapentin (NEURONTIN) 300 MG capsule TAKE TWO (2) CAPSULES BY MOUTH THREE TIMES DAILY. 180 capsule 1  . hydrochlorothiazide (HYDRODIURIL) 25 MG tablet Take 1 tablet (25 mg total) by mouth daily. 90 tablet 1  . metoprolol succinate (TOPROL-XL) 50 MG 24 hr tablet Take 1 tablet (50 mg total) by mouth daily. Take with or immediately following a meal. 90 tablet 0  . Multiple Vitamin (MULTIVITAMIN) capsule Take 1 capsule by mouth daily.    . traZODone (DESYREL) 100 MG tablet Take 1 tablet (100 mg total) by mouth at bedtime as needed for sleep. Take 1 tablets by mouth every night as directed 90 tablet 0  . venlafaxine XR (EFFEXOR-XR) 150 MG 24 hr capsule 225 mg daily (150 mg + 75 mg) 90 capsule 0  . venlafaxine XR (EFFEXOR-XR) 75 MG 24 hr capsule 225 mg daily (150 mg + 75 mg ) 90 capsule 0   No current facility-administered medications for this visit.     Musculoskeletal: Strength & Muscle Tone: N.A Gait & Station: N/A Patient leans: N/A  Psychiatric Specialty Exam: Review of Systems  Psychiatric/Behavioral: Positive for dysphoric mood. Negative for agitation, behavioral problems, confusion, decreased concentration, hallucinations, self-injury, sleep disturbance and suicidal ideas. The patient is nervous/anxious. The patient is not hyperactive.   All other systems reviewed and are negative.   There were no vitals taken for this visit.There is no height or weight on file to calculate BMI.  General Appearance: Fairly Groomed  Eye Contact:  Good   Speech:  Clear and Coherent  Volume:  Normal  Mood:  challenging  Affect:  Appropriate, Congruent and euthymic  Thought Process:  Coherent  Orientation:  Full (Time, Place, and Person)  Thought Content: Logical   Suicidal Thoughts:  No  Homicidal Thoughts:  No  Memory:  Immediate;   Good  Judgement:  Good  Insight:  Fair  Psychomotor Activity:  Normal  Concentration:  Concentration: Good and Attention Span: Good  Recall:  Good  Fund of Knowledge: Good  Language: Good  Akathisia:  No  Handed:  Right  AIMS (if indicated): not done  Assets:  Communication Skills Desire for Improvement  ADL's:  Intact  Cognition: WNL  Sleep:  Fair   Screenings: GAD-7     Virtual [facility] Phone Follow Up from 10/29/2017 in [place] Phone Follow Up from 09/25/2017 in [facility] Phone Follow Up from 05/28/2017 in [place]  Total GAD-7 Score 12 17 14     PHQ2-9     Office Visit from 11/05/2018 in [place] Office Visit from 11/05/2017 in [facility] Phone Follow Up from 10/29/2017 in [facility] Nutrition from 10/15/2017 in [facility] [place] Phone Follow Up from 09/25/2017 in [facility]  PHQ-2 Total Score 2 2 6 6 6   PHQ-9 Total Score 5 5 15 19 16        Assessment and Plan:  Adriahna Shearman is a 64 y.o. year old female with a history of depression,meralgia paresthetica, hyperlipidemia , who presents for follow up appointment for below.    1. MDD (major depressive disorder), recurrent episode, mild ([place]) She continues to report depressive symptoms and fatigue since the last visit.  Recent psychosocial stressors includes her mother being admitted for CDif. 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  We uptitrate  bupropion adjunctive treatment for depression.  Discussed potential risk of headache and worsening in anxiety.  She has no known history of seizure.  We will continue venlafaxine to target depression.   # Insomnia She reports good benefit from trazodone.  Will continue current dose for insomnia.  She will also take clonazepam as needed for anxiety/insomnia.  Discussed risk of dependence and oversedation.  Plan I have reviewed and updated plans as below 1.Continuevenlafaxine 225 mg daily (150 mg + 75 mg) 2.Increasebupropion450mg  daily  3. Continue Trazodone 100 mg at night 4.ContinueClonazepam 0.5 mg once a day as needed for anxiety  - she agreed to hold taking it if able. She declined refill this time 5.Next appointment:8/19 at 1 PM for 20 mins, video   - on gabapentin 600 mg three times a day  I have utilized the [place] Controlled Substances Reporting System (PMP AWARxE) to confirm adherence regarding the patient's medication. My review reveals appropriate prescription fills.   Past trials of medication:sertraline, fluoxetine, venlafaxine, xanax, clonazepam, temazepam, Ambien  The patient demonstrates the following risk factors for suicide: Chronic risk factors for suicide include:psychiatric disorder ofdepression. Acute risk factorsfor suicide include: N/A. Protective factorsfor this patient include: coping skills and hope for the future. Considering these factors, the overall suicide risk at this point appears to below. Patientisappropriate for outpatient follow up.  Renee Clay, MD 08/26/2019, 10:20 AM

## 2019-08-26 ENCOUNTER — Encounter: Payer: Self-pay | Admitting: Orthopaedic Surgery

## 2019-08-26 ENCOUNTER — Telehealth (INDEPENDENT_AMBULATORY_CARE_PROVIDER_SITE_OTHER): Payer: Commercial Managed Care - PPO | Admitting: Psychiatry

## 2019-08-26 ENCOUNTER — Ambulatory Visit (INDEPENDENT_AMBULATORY_CARE_PROVIDER_SITE_OTHER): Payer: Commercial Managed Care - PPO | Admitting: Orthopaedic Surgery

## 2019-08-26 ENCOUNTER — Encounter (HOSPITAL_COMMUNITY): Payer: Self-pay | Admitting: Psychiatry

## 2019-08-26 ENCOUNTER — Other Ambulatory Visit: Payer: Self-pay

## 2019-08-26 VITALS — Ht 61.0 in | Wt 182.0 lb

## 2019-08-26 DIAGNOSIS — F419 Anxiety disorder, unspecified: Secondary | ICD-10-CM

## 2019-08-26 DIAGNOSIS — M778 Other enthesopathies, not elsewhere classified: Secondary | ICD-10-CM

## 2019-08-26 DIAGNOSIS — G4700 Insomnia, unspecified: Secondary | ICD-10-CM

## 2019-08-26 DIAGNOSIS — F330 Major depressive disorder, recurrent, mild: Secondary | ICD-10-CM | POA: Diagnosis not present

## 2019-08-26 MED ORDER — VENLAFAXINE HCL ER 75 MG PO CP24: ORAL_CAPSULE | ORAL | 0 refills | Status: DC

## 2019-08-26 MED ORDER — TRAZODONE HCL 100 MG PO TABS: 100.0000 mg | ORAL_TABLET | Freq: Every evening | ORAL | 0 refills | Status: DC | PRN

## 2019-08-26 MED ORDER — BUPROPION HCL ER (XL) 150 MG PO TB24: ORAL_TABLET | ORAL | 1 refills | Status: DC

## 2019-08-26 MED ORDER — VENLAFAXINE HCL ER 150 MG PO CP24: ORAL_CAPSULE | ORAL | 0 refills | Status: DC

## 2019-08-26 NOTE — Progress Notes (Signed)
Office Visit Note   Patient: [name]           Date of Birth: Feb 22, 1955           MRN: [id number] Visit Date: 08/26/2019              Requested by: Caren Macadam, MD [place],  [place] [address] PCP: Caren Macadam, MD   Assessment & Plan: Visit Diagnoses:  1. Left wrist tendonitis     Plan: We discussed first dorsal compartment release.  Currently she states her wrist is better she is taking care of her invalid mother.  She will call if she has increased symptoms or would like to proceed with injection.  Follow-Up Instructions: No follow-ups on file.   Orders:  No orders of the defined types were placed in this encounter.  No orders of the defined types were placed in this encounter.     Procedures: No procedures performed   Clinical Data: No additional findings.   Subjective: Chief Complaint  Patient presents with  . Left Wrist - Follow-up    HPI 64 year old female returns with first dorsal compartment tenosynovitis left wrist.  Post aspiration attempts she states is actually been somewhat better she has been taking some meloxicam which tends to help.  She use the brace intermittently.  She takes care of her mother who has Alzheimer's also has sacral decubitus and is bedridden.  She has to roll her mother to keep her off her sacrum, change linens bathe her etc.  Overall she thinks that wrist is improved and is not bothering her as much as it was before 07/15/2019 visit.  Review of Systems 14 point systems unchanged from last office visit.   Objective: Vital Signs: Ht 5\' 1"  (1.549 m)   Wt 182 lb (82.6 kg)   BMI 34.39 kg/m   Physical Exam Constitutional:      Appearance: She is well-developed.  HENT:     Head: Normocephalic.     Right Ear: External ear normal.     Left Ear: External ear normal.  Eyes:     Pupils: Pupils are equal, round, and reactive to light.  Neck:     Thyroid: No thyromegaly.     Trachea: No tracheal deviation.   Cardiovascular:     Rate and Rhythm: Normal rate.  Pulmonary:     Effort: Pulmonary effort is normal.  Abdominal:     Palpations: Abdomen is soft.  Skin:    General: Skin is warm and dry.  Neurological:     Mental Status: She is alert and oriented to person, place, and time.  Psychiatric:        Behavior: Behavior normal.     Ortho Exam good cervical range of motion full range of motion of the right and left elbow.  Left first dorsal compartment still shows some prominence and mild tenderness.  Moderate Finkelstein test.  Specialty Comments:  No specialty comments available.  Imaging: No results found.   PMFS History: Patient Active Problem List   Diagnosis Date Noted  . Left wrist tendonitis 07/15/2019  . MDD (major depressive disorder), recurrent, in full remission ([place]) 04/01/2019  . Benign lipomatous neoplasm of skin, subcu of right leg 06/27/2017  . Meralgia paresthetica of right side 06/05/2017  . Sciatica of right side 01/29/2017  . Anxiety 11/13/2016  . Need for immunization against influenza 11/13/2016  . Hyperlipidemia LDL goal <100 11/13/2016  . Insomnia 11/13/2016  . HTN, goal below  140/90 11/13/2016   Past Medical History:  Diagnosis Date  . Anxiety   . Depression   . GERD (gastroesophageal reflux disease)   . Hypertension   . Meralgia paresthetica of right side 06/05/2017    Family History  Problem Relation Age of Onset  . Alzheimer's disease Mother   . Hypertension Sister   . Diabetes Maternal Uncle   . Hypertension Sister     Past Surgical History:  Procedure Laterality Date  . KNEE CARTILAGE SURGERY Right    Workers Compensation, fell out of chair at work.    Social History   Occupational History  . Not on file  Tobacco Use  . Smoking status: Former Smoker    Quit date: 10/13/1983    Years since quitting: 35.8  . Smokeless tobacco: Never Used  Substance and Sexual Activity  . Alcohol use: No  . Drug use: No  . Sexual activity: Not  Currently    Birth control/protection: Post-menopausal

## 2019-10-04 NOTE — Progress Notes (Signed)
Virtual Visit via Video Note  I connected with [name] on 10/07/19 at 12:00 PM EDT by a video enabled telemedicine application and verified that I am speaking with the correct person using two identifiers.   I discussed the limitations of evaluation and management by telemedicine and the availability of in person appointments. The patient expressed understanding and agreed to proceed.     I discussed the assessment and treatment plan with the patient. The patient was provided an opportunity to ask questions and all were answered. The patient agreed with the plan and demonstrated an understanding of the instructions.   The patient was advised to call back or seek an in-person evaluation if the symptoms worsen or if the condition fails to improve as anticipated.  Location: patient- home, provider- home office   I provided 13 minutes of non-face-to-face time during this encounter.   [name], MD    [facility] MD/PA/NP OP Progress Note  10/07/2019 12:26 PM [name]  MRN:  [id number]  Chief Complaint:  Chief Complaint    Depression; Follow-up     HPI:  This is a follow-up appointment for depression, anxiety and insomnia.  She states that she feels more energetic since up titration of bupropion.  However, she tends to feel tired later in the day.  She has been able to continue taking care of her mother.  Her mother is doing better lately.  She makes sure to water flowers regardless of how she feels.  She has been working on [employer].  She has occasional middle insomnia.  She reports good concentration.  She has occasional anhedonia.  She has good appetite.  She feels down at times, stating that she still struggles with thought patterns.  She denies SI.  She tends to feel anxious on her way to her mother.  She would rather work on by herself at this time rather than seeing a therapist as she felt there were too many things to accomplish during the therapy.   Visit  Diagnosis:    ICD-10-CM   1. MDD (major depressive disorder), recurrent episode, mild ([place])  F33.0   2. Insomnia, unspecified type  G47.00 venlafaxine XR (EFFEXOR-XR) 75 MG 24 hr capsule    venlafaxine XR (EFFEXOR-XR) 150 MG 24 hr capsule    traZODone (DESYREL) 100 MG tablet  3. Anxiety  F41.9 venlafaxine XR (EFFEXOR-XR) 75 MG 24 hr capsule    venlafaxine XR (EFFEXOR-XR) 150 MG 24 hr capsule    Past Psychiatric History: Please see initial evaluation for full details. I have reviewed the history. No updates at this time.     Past Medical History:  Past Medical History:  Diagnosis Date  . Anxiety   . Depression   . GERD (gastroesophageal reflux disease)   . Hypertension   . Meralgia paresthetica of right side 06/05/2017    Past Surgical History:  Procedure Laterality Date  . KNEE CARTILAGE SURGERY Right    Workers Compensation, fell out of chair at work.     Family Psychiatric History: Please see initial evaluation for full details. I have reviewed the history. No updates at this time.     Family History:  Family History  Problem Relation Age of Onset  . Alzheimer's disease Mother   . Hypertension Sister   . Diabetes Maternal Uncle   . Hypertension Sister     Social History:  Social History   Socioeconomic History  . Marital status: Married    Spouse name: Not on  file  . Number of children: Not on file  . Years of education: Not on file  . Highest education level: Not on file  Occupational History  . Not on file  Tobacco Use  . Smoking status: Former Smoker    Quit date: 10/13/1983    Years since quitting: 36.0  . Smokeless tobacco: Never Used  Substance and Sexual Activity  . Alcohol use: No  . Drug use: No  . Sexual activity: Not Currently    Birth control/protection: Post-menopausal  Other Topics Concern  . Not on file  Social History Narrative   Lives   Caffeine use:    Married. 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    Spends every 3rd  night at [facility] [employer].    Has been taking care of mother since 89.    Social Determinants of Health   Financial Resource Strain:   . Difficulty of Paying Living Expenses: Not on file  Food Insecurity:   . Worried About Charity fundraiser in the Last Year: Not on file  . Ran Out of Food in the Last Year: Not on file  Transportation Needs:   . Lack of Transportation (Medical): Not on file  . Lack of Transportation (Non-Medical): Not on file  Physical Activity:   . Days of Exercise per Week: Not on file  . Minutes of Exercise per Session: Not on file  Stress:   . Feeling of Stress : Not on file  Social Connections:   . Frequency of Communication with Friends and Family: Not on file  . Frequency of Social Gatherings with Friends and Family: Not on file  . Attends Religious Services: Not on file  . Active Member of Clubs or Organizations: Not on file  . Attends Archivist Meetings: Not on file  . Marital Status: Not on file    Allergies: No Known Allergies  Metabolic Disorder Labs: Lab Results  Component Value Date   HGBA1C 5.5 11/06/2016   HGBA1C 5.5 11/06/2016   MPG 111 11/06/2016   No results found for: PROLACTIN Lab Results  Component Value Date   CHOL 149 10/30/2017   TRIG 73 10/30/2017   HDL 48 (L) 10/30/2017   CHOLHDL 3.1 10/30/2017   LDLCALC 85 10/30/2017   LDLCALC 189 (H) 07/10/2017   Lab Results  Component Value Date   TSH 0.98 10/30/2017   TSH 1.23 11/06/2016   TSH 1.23 11/06/2016    Therapeutic Level Labs: No results found for: LITHIUM No results found for: VALPROATE No components found for:  CBMZ  Current Medications: Current Outpatient Medications  Medication Sig Dispense Refill  . aspirin EC 81 MG tablet Take 81 mg by mouth daily.    Marland Kitchen atorvastatin (LIPITOR) 20 MG tablet Take 1 tablet (20 mg total) by mouth daily. 90 tablet 3  . Biotin 5 MG TABS Use one po qd  0  . [START ON 10/25/2019] buPROPion (WELLBUTRIN XL) 150 MG 24 hr  tablet Total of 450 mg daily, along with 300 mg tab 30 tablet 1  . buPROPion (WELLBUTRIN XL) 300 MG 24 hr tablet Take 1 tablet (300 mg total) by mouth daily. 90 tablet 0  . chlorhexidine (PERIDEX) 0.12 % solution 5 mLs by Mouth Rinse route 2 (two) times daily.    . Cholecalciferol (D3-1000) 1000 units capsule Take 4,000 Units by mouth daily.    . clonazePAM (KLONOPIN) 0.5 MG tablet Take 1 tablet (0.5 mg total) by mouth daily as needed  for anxiety. 30 tablet 1  . gabapentin (NEURONTIN) 300 MG capsule TAKE TWO (2) CAPSULES BY MOUTH THREE TIMES DAILY. 180 capsule 1  . hydrochlorothiazide (HYDRODIURIL) 25 MG tablet Take 1 tablet (25 mg total) by mouth daily. 90 tablet 1  . metoprolol succinate (TOPROL-XL) 50 MG 24 hr tablet Take 1 tablet (50 mg total) by mouth daily. Take with or immediately following a meal. 90 tablet 0  . Multiple Vitamin (MULTIVITAMIN) capsule Take 1 capsule by mouth daily.    Derrill Memo ON 11/24/2019] traZODone (DESYREL) 100 MG tablet Take 1 tablet (100 mg total) by mouth at bedtime as needed for sleep. Take 1 tablets by mouth every night as directed 90 tablet 0  . [START ON 11/24/2019] venlafaxine XR (EFFEXOR-XR) 150 MG 24 hr capsule 225 mg daily (150 mg + 75 mg) 90 capsule 0  . [START ON 11/24/2019] venlafaxine XR (EFFEXOR-XR) 75 MG 24 hr capsule 225 mg daily (150 mg + 75 mg ) 90 capsule 0   No current facility-administered medications for this visit.     Musculoskeletal: Strength & Muscle Tone: N/A Gait & Station: N/A Patient leans: N/A  Psychiatric Specialty Exam: Review of Systems  Psychiatric/Behavioral: Positive for dysphoric mood. Negative for agitation, behavioral problems, confusion, decreased concentration, hallucinations, self-injury, sleep disturbance and suicidal ideas. The patient is nervous/anxious. The patient is not hyperactive.   All other systems reviewed and are negative.   There were no vitals taken for this visit.There is no height or weight on file to  calculate BMI.  General Appearance: Fairly Groomed  Eye Contact:  Good  Speech:  Clear and Coherent  Volume:  Normal  Mood:  okay  Affect:  Appropriate, Congruent and euthymic  Thought Process:  Coherent  Orientation:  Full (Time, Place, and Person)  Thought Content: Logical   Suicidal Thoughts:  No  Homicidal Thoughts:  No  Memory:  Immediate;   Good  Judgement:  Good  Insight:  Fair  Psychomotor Activity:  Normal  Concentration:  Concentration: Good and Attention Span: Good  Recall:  Good  Fund of Knowledge: Good  Language: Good  Akathisia:  No  Handed:  Right  AIMS (if indicated): not done  Assets:  Communication Skills Desire for Improvement  ADL's:  Intact  Cognition: WNL  Sleep:  Good   Screenings: GAD-7     Virtual BH Phone Follow Up from 10/29/2017 in [place] [facility] Phone Follow Up from 09/25/2017 in [place] [facility] Phone Follow Up from 05/28/2017 in [place]  Total GAD-7 Score 12 17 14     PHQ2-9     Office Visit from 11/05/2018 in [place] Office Visit from 11/05/2017 in [facility] Phone Follow Up from 10/29/2017 in [facility] Nutrition from 10/15/2017 in [facility] [place] Phone Follow Up from 09/25/2017 in [facility]  PHQ-2 Total Score 2 2 6 6 6   PHQ-9 Total Score 5 5 15 19 16        Assessment and Plan:  [name] is a 64 y.o. year old female with a history of depression,meralgia paresthetica, hyperlipidemia , who presents for follow up appointment for below.   1. MDD (major depressive disorder), recurrent episode, mild ([place]) Although she still has fatigue later in the day, there has been overall improvement in depressive symptoms since up titration of bupropion.  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  She is willing to continue working on behavioral activation.  We will continue current medication regimen.  Will continue venlafaxine to target depression.  We will continue bupropion as adjunctive treatment for depression.  She has no known history of seizure.  Will continue clonazepam as needed for anxiety.   2. Insomnia, unspecified type She reports benefit from trazodone.  Will continue trazodone as needed for insomnia.   Plan I have reviewed and updated plans as below 1.Continuevenlafaxine 225 mg daily (150 mg + 75 mg) 2.Continue bupropion450mg  daily  3. Continue Trazodone 100 mg at night 4.ContinueClonazepam 0.5 mg once a day as needed for anxiety- she rarely takes this medication 5.Next appointment:11/18 at 11 AM  for 20 mins, video  - on gabapentin 600 mg three times a day   Past trials of medication:sertraline, fluoxetine, venlafaxine, xanax, clonazepam, temazepam, Ambien  The patient demonstrates the following risk factors for suicide: Chronic risk factors for suicide include:psychiatric disorder ofdepression. Acute risk factorsfor suicide include: N/A. Protective factorsfor this patient include: coping skills and hope for the future. Considering these factors, the overall suicide risk at this point appears to below. Patientisappropriate for outpatient follow up.   [name], MD 10/07/2019, 12:26 PM

## 2019-10-07 ENCOUNTER — Encounter (HOSPITAL_COMMUNITY): Payer: Self-pay | Admitting: Psychiatry

## 2019-10-07 ENCOUNTER — Telehealth (INDEPENDENT_AMBULATORY_CARE_PROVIDER_SITE_OTHER): Payer: Commercial Managed Care - PPO | Admitting: Psychiatry

## 2019-10-07 ENCOUNTER — Other Ambulatory Visit: Payer: Self-pay

## 2019-10-07 DIAGNOSIS — F330 Major depressive disorder, recurrent, mild: Secondary | ICD-10-CM | POA: Diagnosis not present

## 2019-10-07 DIAGNOSIS — F419 Anxiety disorder, unspecified: Secondary | ICD-10-CM

## 2019-10-07 DIAGNOSIS — G4700 Insomnia, unspecified: Secondary | ICD-10-CM | POA: Diagnosis not present

## 2019-10-07 MED ORDER — BUPROPION HCL ER (XL) 300 MG PO TB24: 300.0000 mg | ORAL_TABLET | Freq: Every day | ORAL | 0 refills | Status: DC

## 2019-10-07 MED ORDER — VENLAFAXINE HCL ER 75 MG PO CP24: ORAL_CAPSULE | ORAL | 0 refills | Status: DC

## 2019-10-07 MED ORDER — BUPROPION HCL ER (XL) 150 MG PO TB24: ORAL_TABLET | ORAL | 1 refills | Status: DC

## 2019-10-07 MED ORDER — TRAZODONE HCL 100 MG PO TABS: 100.0000 mg | ORAL_TABLET | Freq: Every evening | ORAL | 0 refills | Status: DC | PRN

## 2019-10-07 MED ORDER — VENLAFAXINE HCL ER 150 MG PO CP24: ORAL_CAPSULE | ORAL | 0 refills | Status: DC

## 2019-12-17 ENCOUNTER — Telehealth: Payer: Self-pay | Admitting: Radiology

## 2019-12-17 MED ORDER — MELOXICAM 15 MG PO TABS: 15.0000 mg | ORAL_TABLET | Freq: Every day | ORAL | 3 refills | Status: AC

## 2019-12-17 NOTE — Telephone Encounter (Signed)
Sent to pharmacy with 3 additional refills per Dr. Lorin Mercy.

## 2019-12-17 NOTE — Telephone Encounter (Signed)
Patient is asking for refill on Meloxicam. She originally got this from her PCP, however, she states that you told her at her last appointment that you would refill for her.   Please advise.

## 2019-12-17 NOTE — Addendum Note (Signed)
Addended by: Meyer Cory on: 12/17/2019 05:05 PM   Modules accepted: Orders

## 2019-12-17 NOTE — Telephone Encounter (Signed)
Ok thanks 

## 2019-12-27 NOTE — Progress Notes (Signed)
Virtual Visit via Video Note  I connected with [name] on 01/06/20 at 11:00 AM EST by a video enabled telemedicine application and verified that I am speaking with the correct person using two identifiers.  Location: Patient: home Provider: office    I discussed the limitations of evaluation and management by telemedicine and the availability of in person appointments. The patient expressed understanding and agreed to proceed.   I discussed the assessment and treatment plan with the patient. The patient was provided an opportunity to ask questions and all were answered. The patient agreed with the plan and demonstrated an understanding of the instructions.   The patient was advised to call back or seek an in-person evaluation if the symptoms worsen or if the condition fails to improve as anticipated.  I provided 10 minutes of non-face-to-face time during this encounter.   [name], MD    [facility] MD/PA/NP OP Progress Note  01/06/2020 11:37 AM [name]  MRN:  [id number]  Chief Complaint:  Chief Complaint    Follow-up; Depression     HPI:  This is a follow-up appointment for depression and insomnia.  She states that her situation is the same.  She continues to take care of her mother and her uncle.  Although she feels dread visiting them and would rather be at home, she has been able to work through it. She enjoys doing gardening, and watching some TV program.  She feels down at times. She has fair sleep.  She finds trazodone to be helpful.  She has fair energy and motivation.  She denies any change in weight or appetite.  She denies SI.  She feels less anxious.  She has not been taking any clonazepam for several months.  She feels comfortable staying on the same medication regimen.   Daily routine: taking care of her mother with alzheimer, her uncle who is on dialysis Employment: unemployed, used to work at [employer]: husband Marital status:married since  2008 Number of children:0   Wt Readings from Last 3 Encounters:  08/26/19 182 lb (82.6 kg)  07/15/19 186 lb (84.4 kg)  11/05/17 176 lb 1.9 oz (79.9 kg)    Visit Diagnosis:    ICD-10-CM   1. MDD (major depressive disorder), recurrent episode, mild ([place])  F33.0   2. Insomnia, unspecified type  G47.00 traZODone (DESYREL) 100 MG tablet    venlafaxine XR (EFFEXOR-XR) 150 MG 24 hr capsule    venlafaxine XR (EFFEXOR-XR) 75 MG 24 hr capsule  3. Anxiety  F41.9 venlafaxine XR (EFFEXOR-XR) 150 MG 24 hr capsule    venlafaxine XR (EFFEXOR-XR) 75 MG 24 hr capsule    Past Psychiatric History: Please see initial evaluation for full details. I have reviewed the history. No updates at this time.     Past Medical History:  Past Medical History:  Diagnosis Date  . Anxiety   . Depression   . GERD (gastroesophageal reflux disease)   . Hypertension   . Meralgia paresthetica of right side 06/05/2017    Past Surgical History:  Procedure Laterality Date  . KNEE CARTILAGE SURGERY Right    Workers Compensation, fell out of chair at work.     Family Psychiatric History: Please see initial evaluation for full details. I have reviewed the history. No updates at this time.     Family History:  Family History  Problem Relation Age of Onset  . Alzheimer's disease Mother   . Hypertension Sister   . Diabetes Maternal Uncle   .  Hypertension Sister     Social History:  Social History   Socioeconomic History  . Marital status: Married    Spouse name: Not on file  . Number of children: Not on file  . Years of education: Not on file  . Highest education level: Not on file  Occupational History  . Not on file  Tobacco Use  . Smoking status: Former Smoker    Quit date: 10/13/1983    Years since quitting: 36.2  . Smokeless tobacco: Never Used  Substance and Sexual Activity  . Alcohol use: No  . Drug use: No  . Sexual activity: Not Currently    Birth control/protection: Post-menopausal   Other Topics Concern  . Not on file  Social History Narrative   Lives   Caffeine use:    Married. 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    Spends every 3rd night at [facility].    Has been taking care of mother since 66.    Social Determinants of Health   Financial Resource Strain:   . Difficulty of Paying Living Expenses: Not on file  Food Insecurity:   . Worried About Charity fundraiser in the Last Year: Not on file  . Ran Out of Food in the Last Year: Not on file  Transportation Needs:   . Lack of Transportation (Medical): Not on file  . Lack of Transportation (Non-Medical): Not on file  Physical Activity:   . Days of Exercise per Week: Not on file  . Minutes of Exercise per Session: Not on file  Stress:   . Feeling of Stress : Not on file  Social Connections:   . Frequency of Communication with Friends and Family: Not on file  . Frequency of Social Gatherings with Friends and Family: Not on file  . Attends Religious Services: Not on file  . Active Member of Clubs or Organizations: Not on file  . Attends Archivist Meetings: Not on file  . Marital Status: Not on file    Allergies: No Known Allergies  Metabolic Disorder Labs: Lab Results  Component Value Date   HGBA1C 5.5 11/06/2016   HGBA1C 5.5 11/06/2016   MPG 111 11/06/2016   No results found for: PROLACTIN Lab Results  Component Value Date   CHOL 149 10/30/2017   TRIG 73 10/30/2017   HDL 48 (L) 10/30/2017   CHOLHDL 3.1 10/30/2017   LDLCALC 85 10/30/2017   LDLCALC 189 (H) 07/10/2017   Lab Results  Component Value Date   TSH 0.98 10/30/2017   TSH 1.23 11/06/2016   TSH 1.23 11/06/2016    Therapeutic Level Labs: No results found for: LITHIUM No results found for: VALPROATE No components found for:  CBMZ  Current Medications: Current Outpatient Medications  Medication Sig Dispense Refill  . aspirin EC 81 MG tablet Take 81 mg by mouth daily.    Marland Kitchen atorvastatin (LIPITOR)  20 MG tablet Take 1 tablet (20 mg total) by mouth daily. 90 tablet 3  . Biotin 5 MG TABS Use one po qd  0  . buPROPion (WELLBUTRIN XL) 150 MG 24 hr tablet Total of 450 mg daily, along with 300 mg tab 90 tablet 0  . buPROPion (WELLBUTRIN XL) 300 MG 24 hr tablet Take 1 tablet (300 mg total) by mouth daily. 90 tablet 0  . chlorhexidine (PERIDEX) 0.12 % solution 5 mLs by Mouth Rinse route 2 (two) times daily.    . Cholecalciferol (D3-1000) 1000 units capsule Take 4,000  Units by mouth daily.    . clonazePAM (KLONOPIN) 0.5 MG tablet Take 1 tablet (0.5 mg total) by mouth daily as needed for anxiety. 30 tablet 1  . gabapentin (NEURONTIN) 300 MG capsule TAKE TWO (2) CAPSULES BY MOUTH THREE TIMES DAILY. 180 capsule 1  . hydrochlorothiazide (HYDRODIURIL) 25 MG tablet Take 1 tablet (25 mg total) by mouth daily. 90 tablet 1  . meloxicam (MOBIC) 15 MG tablet Take 1 tablet (15 mg total) by mouth daily. 30 tablet 3  . metoprolol succinate (TOPROL-XL) 50 MG 24 hr tablet Take 1 tablet (50 mg total) by mouth daily. Take with or immediately following a meal. 90 tablet 0  . Multiple Vitamin (MULTIVITAMIN) capsule Take 1 capsule by mouth daily.    . traZODone (DESYREL) 100 MG tablet Take 1 tablet (100 mg total) by mouth at bedtime as needed for sleep. Take 1 tablets by mouth every night as directed 90 tablet 0  . venlafaxine XR (EFFEXOR-XR) 150 MG 24 hr capsule 225 mg daily (150 mg + 75 mg) 90 capsule 0  . venlafaxine XR (EFFEXOR-XR) 75 MG 24 hr capsule 225 mg daily (150 mg + 75 mg ) 90 capsule 0   No current facility-administered medications for this visit.     Musculoskeletal: Strength & Muscle Tone: N/A Gait & Station: N/A Patient leans: N/A  Psychiatric Specialty Exam: Review of Systems  Psychiatric/Behavioral: Positive for dysphoric mood. Negative for agitation, behavioral problems, confusion, decreased concentration, hallucinations, self-injury, sleep disturbance and suicidal ideas. The patient is  nervous/anxious. The patient is not hyperactive.   All other systems reviewed and are negative.   There were no vitals taken for this visit.There is no height or weight on file to calculate BMI.  General Appearance: Fairly Groomed  Eye Contact:  Good  Speech:  Clear and Coherent  Volume:  Normal  Mood:  good  Affect:  Appropriate, Congruent and euthymic  Thought Process:  Coherent  Orientation:  Full (Time, Place, and Person)  Thought Content: Logical   Suicidal Thoughts:  No  Homicidal Thoughts:  No  Memory:  Immediate;   Good  Judgement:  Good  Insight:  Good  Psychomotor Activity:  Normal  Concentration:  Concentration: Good and Attention Span: Good  Recall:  Good  Fund of Knowledge: Good  Language: Good  Akathisia:  No  Handed:  Right  AIMS (if indicated): not done  Assets:  Communication Skills Desire for Improvement  ADL's:  Intact  Cognition: WNL  Sleep:  Fair   Screenings: GAD-7     Virtual BH Phone Follow Up from 10/29/2017 in [place] Phone Follow Up from 09/25/2017 in [facility] Phone Follow Up from 05/28/2017 in [place]  Total GAD-7 Score 12 17 14     PHQ2-9     Office Visit from 11/05/2018 in [place] Office Visit from 11/05/2017 in [place] [facility] Phone Follow Up from 10/29/2017 in [facility] Nutrition from 10/15/2017 in [facility] [place] Phone Follow Up from 09/25/2017 in [facility]  PHQ-2 Total Score 2 2 6 6 6   PHQ-9 Total Score 5 5 15 19 16        Assessment and Plan:  [name] is a 64 y.o. year old female with a history of depression,meralgia paresthetica, hyperlipidemia, who presents for follow up appointment for below.   1. MDD (major depressive disorder), recurrent episode, mild ([place])  She reports overrate improvement and depressive symptoms  since the last visit.  Psychosocial stressors includes taking care of her mother with Alzheimer disease, and her uncle with dialysis.  Will continue current medication regimen.  We will continue venlafaxine to target depression.  Will continue bupropion as adjunctive treatment for depression.  She has no known history of a seizure.   2. Insomnia, unspecified type She reports good benefit from trazodone.  Will continue current dose to target insomnia.   Plan I have reviewed and updated plans as below 1.Continuevenlafaxine 225 mg daily (150 mg + 75 mg) 2.Continue bupropion450mg  daily  3. Continue Trazodone 100 mg at night 4.ContinueClonazepam 0.5 mg once a day as needed for anxiety- she rarely takes this medication, has one refill left 5.Next appointment: 2/24 at 11 AM for 20 mins, video  - on gabapentin 600 mg three times a day   Past trials of medication:sertraline, fluoxetine, venlafaxine, xanax, clonazepam, temazepam, Ambien  The patient demonstrates the following risk factors for suicide: Chronic risk factors for suicide include:psychiatric disorder ofdepression. Acute risk factorsfor suicide include: N/A. Protective factorsfor this patient include: coping skills and hope for the future. Considering these factors, the overall suicide risk at this point appears to below. Patientisappropriate for outpatient follow up.  [name], MD 01/06/2020, 11:37 AM

## 2020-01-06 ENCOUNTER — Telehealth (INDEPENDENT_AMBULATORY_CARE_PROVIDER_SITE_OTHER): Payer: Commercial Managed Care - PPO | Admitting: Psychiatry

## 2020-01-06 ENCOUNTER — Other Ambulatory Visit: Payer: Self-pay

## 2020-01-06 ENCOUNTER — Encounter: Payer: Self-pay | Admitting: Psychiatry

## 2020-01-06 ENCOUNTER — Telehealth (HOSPITAL_COMMUNITY): Payer: Commercial Managed Care - PPO | Admitting: Psychiatry

## 2020-01-06 DIAGNOSIS — G4700 Insomnia, unspecified: Secondary | ICD-10-CM

## 2020-01-06 DIAGNOSIS — F419 Anxiety disorder, unspecified: Secondary | ICD-10-CM | POA: Diagnosis not present

## 2020-01-06 DIAGNOSIS — F330 Major depressive disorder, recurrent, mild: Secondary | ICD-10-CM

## 2020-01-06 MED ORDER — TRAZODONE HCL 100 MG PO TABS: 100.0000 mg | ORAL_TABLET | Freq: Every evening | ORAL | 0 refills | Status: DC | PRN

## 2020-01-06 MED ORDER — VENLAFAXINE HCL ER 75 MG PO CP24: ORAL_CAPSULE | ORAL | 0 refills | Status: DC

## 2020-01-06 MED ORDER — BUPROPION HCL ER (XL) 300 MG PO TB24
300.0000 mg | ORAL_TABLET | Freq: Every day | ORAL | 0 refills | Status: DC
Start: 2020-01-06 — End: 2020-04-13

## 2020-01-06 MED ORDER — VENLAFAXINE HCL ER 150 MG PO CP24: ORAL_CAPSULE | ORAL | 0 refills | Status: DC

## 2020-01-06 MED ORDER — BUPROPION HCL ER (XL) 150 MG PO TB24
ORAL_TABLET | ORAL | 0 refills | Status: DC
Start: 2020-01-06 — End: 2020-04-13

## 2020-04-06 NOTE — Progress Notes (Signed)
Virtual Visit via Video Note  I connected with [name] on 04/13/20 at 11:00 AM EST by a video enabled telemedicine application and verified that I am speaking with the correct person using two identifiers.  Location: Patient: home Provider: office Persons participated in the visit- patient, provider   I discussed the limitations of evaluation and management by telemedicine and the availability of in person appointments. The patient expressed understanding and agreed to proceed.   I discussed the assessment and treatment plan with the patient. The patient was provided an opportunity to ask questions and all were answered. The patient agreed with the plan and demonstrated an understanding of the instructions.   The patient was advised to call back or seek an in-person evaluation if the symptoms worsen or if the condition fails to improve as anticipated.  I provided 15 minutes of non-face-to-face time during this encounter.   [name], MD    [facility] MD/PA/NP OP Progress Note  04/13/2020 11:21 AM [name]  MRN:  [id number]  Chief Complaint:  Chief Complaint    Follow-up; Depression     HPI:  This is a follow-up appointment for depression and insomnia.  She states that she has been doing okay.  Things are about the same.  Her mother and her uncle are doing well.  When she is asked about stretch, she has not been able to do it.  Although she thought of doing it, she has another things come up in her mind, such as cleaning the house.  Although she occasionally feels anxious when she thinks about things she needs to do, she has been wondering things relatively well.  She denies insomnia.  She feels down at times.  She has good appetite, and denies change in weight.  She denies SI.  She denies panic attacks.  She has not taken any clonazepam since the last visit.    Daily routine: taking care of her mother with alzheimer, her uncle who is on dialysis Employment: unemployed, used  to work at [employer]: husband Marital status:married since 2008 Number of children:0   Visit Diagnosis:    ICD-10-CM   1. MDD (major depressive disorder), recurrent, in partial remission ([place])  F33.41   2. Insomnia, unspecified type  G47.00 venlafaxine XR (EFFEXOR-XR) 150 MG 24 hr capsule    venlafaxine XR (EFFEXOR-XR) 75 MG 24 hr capsule    traZODone (DESYREL) 100 MG tablet  3. Anxiety  F41.9 venlafaxine XR (EFFEXOR-XR) 150 MG 24 hr capsule    venlafaxine XR (EFFEXOR-XR) 75 MG 24 hr capsule    Past Psychiatric History: Please see initial evaluation for full details. I have reviewed the history. No updates at this time.     Past Medical History:  Past Medical History:  Diagnosis Date  . Anxiety   . Depression   . GERD (gastroesophageal reflux disease)   . Hypertension   . Meralgia paresthetica of right side 06/05/2017    Past Surgical History:  Procedure Laterality Date  . KNEE CARTILAGE SURGERY Right    Workers Compensation, fell out of chair at work.     Family Psychiatric History: Please see initial evaluation for full details. I have reviewed the history. No updates at this time.     Family History:  Family History  Problem Relation Age of Onset  . Alzheimer's disease Mother   . Hypertension Sister   . Diabetes Maternal Uncle   . Hypertension Sister     Social History:  Social History  Socioeconomic History  . Marital status: Married    Spouse name: Not on file  . Number of children: Not on file  . Years of education: Not on file  . Highest education level: Not on file  Occupational History  . Not on file  Tobacco Use  . Smoking status: Former Smoker    Quit date: 10/13/1983    Years since quitting: 36.5  . Smokeless tobacco: Never Used  Substance and Sexual Activity  . Alcohol use: No  . Drug use: No  . Sexual activity: Not Currently    Birth control/protection: Post-menopausal  Other Topics Concern  . Not on file  Social  History Narrative   Lives   Caffeine use:    Married. 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    Spends every 3rd night at [employer].    Has been taking care of mother since 40.    Social Determinants of Health   Financial Resource Strain: Not on file  Food Insecurity: Not on file  Transportation Needs: Not on file  Physical Activity: Not on file  Stress: Not on file  Social Connections: Not on file    Allergies: No Known Allergies  Metabolic Disorder Labs: Lab Results  Component Value Date   HGBA1C 5.5 11/06/2016   HGBA1C 5.5 11/06/2016   MPG 111 11/06/2016   No results found for: PROLACTIN Lab Results  Component Value Date   CHOL 149 10/30/2017   TRIG 73 10/30/2017   HDL 48 (L) 10/30/2017   CHOLHDL 3.1 10/30/2017   LDLCALC 85 10/30/2017   LDLCALC 189 (H) 07/10/2017   Lab Results  Component Value Date   TSH 0.98 10/30/2017   TSH 1.23 11/06/2016   TSH 1.23 11/06/2016    Therapeutic Level Labs: No results found for: LITHIUM No results found for: VALPROATE No components found for:  CBMZ  Current Medications: Current Outpatient Medications  Medication Sig Dispense Refill  . aspirin EC 81 MG tablet Take 81 mg by mouth daily.    Marland Kitchen atorvastatin (LIPITOR) 20 MG tablet Take 1 tablet (20 mg total) by mouth daily. 90 tablet 3  . Biotin 5 MG TABS Use one po qd  0  . buPROPion (WELLBUTRIN XL) 150 MG 24 hr tablet Total of 450 mg daily, along with 300 mg tab 90 tablet 0  . buPROPion (WELLBUTRIN XL) 300 MG 24 hr tablet Take 1 tablet (300 mg total) by mouth daily. 90 tablet 0  . chlorhexidine (PERIDEX) 0.12 % solution 5 mLs by Mouth Rinse route 2 (two) times daily.    . Cholecalciferol (D3-1000) 1000 units capsule Take 4,000 Units by mouth daily.    . clonazePAM (KLONOPIN) 0.5 MG tablet Take 1 tablet (0.5 mg total) by mouth daily as needed for anxiety. 30 tablet 1  . gabapentin (NEURONTIN) 300 MG capsule TAKE TWO (2) CAPSULES BY MOUTH THREE TIMES DAILY. 180  capsule 1  . hydrochlorothiazide (HYDRODIURIL) 25 MG tablet Take 1 tablet (25 mg total) by mouth daily. 90 tablet 1  . meloxicam (MOBIC) 15 MG tablet Take 1 tablet (15 mg total) by mouth daily. 30 tablet 3  . metoprolol succinate (TOPROL-XL) 50 MG 24 hr tablet Take 1 tablet (50 mg total) by mouth daily. Take with or immediately following a meal. 90 tablet 0  . Multiple Vitamin (MULTIVITAMIN) capsule Take 1 capsule by mouth daily.    . traZODone (DESYREL) 100 MG tablet Take 1 tablet (100 mg total) by mouth at bedtime as  needed for sleep. Take 1 tablets by mouth every night as directed 90 tablet 0  . venlafaxine XR (EFFEXOR-XR) 150 MG 24 hr capsule 225 mg daily (150 mg + 75 mg) 90 capsule 0  . venlafaxine XR (EFFEXOR-XR) 75 MG 24 hr capsule 225 mg daily (150 mg + 75 mg ) 90 capsule 0   No current facility-administered medications for this visit.     Musculoskeletal: Strength & Muscle Tone: N/A Gait & Station: N/A Patient leans: N/A  Psychiatric Specialty Exam: Review of Systems  Psychiatric/Behavioral: Positive for dysphoric mood. Negative for agitation, behavioral problems, confusion, decreased concentration, hallucinations, self-injury, sleep disturbance and suicidal ideas. The patient is nervous/anxious. The patient is not hyperactive.   All other systems reviewed and are negative.   There were no vitals taken for this visit.There is no height or weight on file to calculate BMI.  General Appearance: Fairly Groomed  Eye Contact:  Good  Speech:  Clear and Coherent  Volume:  Normal  Mood:  good  Affect:  Appropriate, Congruent and euthymic  Thought Process:  Coherent  Orientation:  Full (Time, Place, and Person)  Thought Content: Logical   Suicidal Thoughts:  No  Homicidal Thoughts:  No  Memory:  Immediate;   Good  Judgement:  Good  Insight:  Fair  Psychomotor Activity:  Normal  Concentration:  Concentration: Good and Attention Span: Good  Recall:  Good  Fund of Knowledge: Good   Language: Good  Akathisia:  No  Handed:  Right  AIMS (if indicated): not done  Assets:  Communication Skills Desire for Improvement  ADL's:  Intact  Cognition: WNL  Sleep:  Good   Screenings: GAD-7   Animal nutritionist BH Phone Follow Up from 10/29/2017 in [place] [facility] Phone Follow Up from 09/25/2017 in [place] [facility] Phone Follow Up from 05/28/2017 in [place]  Total GAD-7 Score 12 17 14     PHQ2-9   Flowsheet Row Video Visit from 04/13/2020 in [place] Office Visit from 11/05/2018 in [place] Office Visit from 11/05/2017 in [place] [facility] Phone Follow Up from 10/29/2017 in [facility] [facility] Nutrition from 10/15/2017 in [facility]  PHQ-2 Total Score 2 2 2 6 6   PHQ-9 Total Score 2 5 5 15 19     Flowsheet Row Video Visit from 04/13/2020 in [place] [facility] Phone Follow Up from 10/29/2017 in [facility] Phone Follow Up from 09/25/2017 in [facility] [facility]  C-SSRS RISK CATEGORY No Risk No Risk No Risk       Assessment and Plan:  Lajean Boese is a 65 y.o. year old female with a history of  depression,meralgia paresthetica, hyperlipidemia, who presents for follow up appointment for below.   1. MDD (major depressive disorder), recurrent, in partial remission ([place]) Although she reports occasional depressed mood symptoms, it has been relatively manageable since the last visit. Psychosocial stressors includes taking care of her mother with Alzheimer disease, and her uncle with dialysis.  Will continue current medication regimen.   Will continue venlafaxine to target depression.  Will continue bupropion as adjunctive treatment for depression.  She has no known history of seizure.   2. Insomnia, unspecified type She reports good  benefit from trazodone.  Will continue current dose to target insomnia.   She reports overrate improvement and depressive symptoms since the last visit.   We will  continue venlafaxine to target depression.  Will continue bupropion as adjunctive treatment for depression.  She has no known history of a seizure.   2. Insomnia, unspecified type She reports good benefit from trazodone.  Will continue current dose to target insomnia.   Plan I have reviewed and updated plans as below 1.Continuevenlafaxine 225 mg daily (150 mg + 75 mg) 2.Continuebupropion450mg  daily  3. Continue Trazodone 100 mg at night 4.Hold clonazepam- she rarely takes this medication 5.Next appointment: 5/24 at 1:20 for 20 mins, video  - on gabapentin 600 mg three times a day  PCP in April 2022  Past trials of medication:sertraline, fluoxetine, venlafaxine, xanax, clonazepam, temazepam, Ambien  The patient demonstrates the following risk factors for suicide: Chronic risk factors for suicide include:psychiatric disorder ofdepression. Acute risk factorsfor suicide include: N/A. Protective factorsfor this patient include: coping skills and hope for the future. Considering these factors, the overall suicide risk at this point appears to below. Patientisappropriate for outpatient follow up.  [name], MD 04/13/2020, 11:21 AM

## 2020-04-13 ENCOUNTER — Encounter: Payer: Self-pay | Admitting: Psychiatry

## 2020-04-13 ENCOUNTER — Telehealth (INDEPENDENT_AMBULATORY_CARE_PROVIDER_SITE_OTHER): Payer: Commercial Managed Care - PPO | Admitting: Psychiatry

## 2020-04-13 ENCOUNTER — Other Ambulatory Visit: Payer: Self-pay

## 2020-04-13 DIAGNOSIS — F3341 Major depressive disorder, recurrent, in partial remission: Secondary | ICD-10-CM | POA: Diagnosis not present

## 2020-04-13 DIAGNOSIS — G4700 Insomnia, unspecified: Secondary | ICD-10-CM | POA: Diagnosis not present

## 2020-04-13 DIAGNOSIS — F419 Anxiety disorder, unspecified: Secondary | ICD-10-CM

## 2020-04-13 MED ORDER — BUPROPION HCL ER (XL) 150 MG PO TB24: ORAL_TABLET | ORAL | 0 refills | Status: DC

## 2020-04-13 MED ORDER — TRAZODONE HCL 100 MG PO TABS: 100.0000 mg | ORAL_TABLET | Freq: Every evening | ORAL | 0 refills | Status: DC | PRN

## 2020-04-13 MED ORDER — BUPROPION HCL ER (XL) 300 MG PO TB24: 300.0000 mg | ORAL_TABLET | Freq: Every day | ORAL | 0 refills | Status: DC

## 2020-04-13 MED ORDER — VENLAFAXINE HCL ER 150 MG PO CP24: ORAL_CAPSULE | ORAL | 0 refills | Status: DC

## 2020-04-13 MED ORDER — VENLAFAXINE HCL ER 75 MG PO CP24: ORAL_CAPSULE | ORAL | 0 refills | Status: DC

## 2020-04-13 NOTE — Patient Instructions (Signed)
1.Continuevenlafaxine 225 mg daily (150 mg + 75 mg) 2.Continuebupropion450mg  daily  3. Continue Trazodone 100 mg at night 4.Hold clonazepam 5.Next appointment: 5/24 at 1:20

## 2020-06-01 ENCOUNTER — Other Ambulatory Visit (HOSPITAL_COMMUNITY): Payer: Self-pay | Admitting: Family Medicine

## 2020-06-01 DIAGNOSIS — Z1231 Encounter for screening mammogram for malignant neoplasm of breast: Secondary | ICD-10-CM

## 2020-06-15 ENCOUNTER — Other Ambulatory Visit: Payer: Self-pay

## 2020-06-15 ENCOUNTER — Ambulatory Visit (HOSPITAL_COMMUNITY)
Admission: RE | Admit: 2020-06-15 | Discharge: 2020-06-15 | Disposition: A | Discharge status: Home / Self Care | Payer: Commercial Managed Care - PPO | Source: Ambulatory Visit | Attending: Family Medicine | Admitting: Family Medicine

## 2020-06-15 DIAGNOSIS — Z1231 Encounter for screening mammogram for malignant neoplasm of breast: Secondary | ICD-10-CM | POA: Diagnosis present

## 2020-07-06 NOTE — Progress Notes (Signed)
Virtual Visit via Video Note  I connected with [name] on 07/11/20 at  1:20 PM EDT by a video enabled telemedicine application and verified that I am speaking with the correct person using two identifiers.  Location: Patient: home Provider: office Persons participated in the visit- patient, provider   I discussed the limitations of evaluation and management by telemedicine and the availability of in person appointments. The patient expressed understanding and agreed to proceed.   I discussed the assessment and treatment plan with the patient. The patient was provided an opportunity to ask questions and all were answered. The patient agreed with the plan and demonstrated an understanding of the instructions.   The patient was advised to call back or seek an in-person evaluation if the symptoms worsen or if the condition fails to improve as anticipated.  I provided 13 minutes of non-face-to-face time during this encounter.   [name], MD    [facility] MD/PA/NP OP Progress Note  07/11/2020 1:51 PM [name]  MRN:  [id number]  Chief Complaint:  Chief Complaint    Follow-up; Depression     HPI:  This is a follow-up appointment for depression and insomnia.  She states that she has been doing about the same, and no change.  Her mother and her uncle have been doing good.  She has not been able to get things done due to time constraints.  She also has occasional anhedonia.  However, she enjoys doing gardening every day when the weather is good.  Although there are moments of feeling down, she has been doing good otherwise.  She has not been able to do physical activity due to issues with her left foot.  She is not seeing a provider for this.  She has not been able to do much stretch since the last visit.  She is hoping to accomplish things, doing different projects in the house.  She denies insomnia; she sleeps well with trazodone.  She denies change in weight or appetite.  She denies SI.     Daily routine:takingcare of her mother with alzheimer,herunclewho is on dialysis Employment:unemployed, used to work at [employer] Marital status:married since 2008 Number of children:0  Visit Diagnosis:    ICD-10-CM   1. MDD (major depressive disorder), recurrent, in partial remission ([facility])  F33.41 venlafaxine XR (EFFEXOR-XR) 150 MG 24 hr capsule    venlafaxine XR (EFFEXOR-XR) 75 MG 24 hr capsule  2. Insomnia, unspecified type  G47.00 venlafaxine XR (EFFEXOR-XR) 150 MG 24 hr capsule    venlafaxine XR (EFFEXOR-XR) 75 MG 24 hr capsule    traZODone (DESYREL) 100 MG tablet    Past Psychiatric History: Please see initial evaluation for full details. I have reviewed the history. No updates at this time.     Past Medical History:  Past Medical History:  Diagnosis Date  . Anxiety   . Depression   . GERD (gastroesophageal reflux disease)   . Hypertension   . Meralgia paresthetica of right side 06/05/2017    Past Surgical History:  Procedure Laterality Date  . KNEE CARTILAGE SURGERY Right    Workers Compensation, fell out of chair at work.     Family Psychiatric History: Please see initial evaluation for full details. I have reviewed the history. No updates at this time.     Family History:  Family History  Problem Relation Age of Onset  . Alzheimer's disease Mother   . Hypertension Sister   . Diabetes Maternal Uncle   . Hypertension  Sister     Social History:  Social History   Socioeconomic History  . Marital status: Married    Spouse name: Not on file  . Number of children: Not on file  . Years of education: Not on file  . Highest education level: Not on file  Occupational History  . Not on file  Tobacco Use  . Smoking status: Former Smoker    Quit date: 10/13/1983    Years since quitting: 36.7  . Smokeless tobacco: Never Used  Substance and Sexual Activity  . Alcohol use: No  . Drug use: No  . Sexual activity: Not Currently     Birth control/protection: Post-menopausal  Other Topics Concern  . Not on file  Social History Narrative   Lives   Caffeine use:    Married. 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    Spends every 3rd night at [employer].    Has been taking care of mother since 67.    Social Determinants of Health   Financial Resource Strain: Not on file  Food Insecurity: Not on file  Transportation Needs: Not on file  Physical Activity: Not on file  Stress: Not on file  Social Connections: Not on file    Allergies: No Known Allergies  Metabolic Disorder Labs: Lab Results  Component Value Date   HGBA1C 5.5 11/06/2016   HGBA1C 5.5 11/06/2016   MPG 111 11/06/2016   No results found for: PROLACTIN Lab Results  Component Value Date   CHOL 149 10/30/2017   TRIG 73 10/30/2017   HDL 48 (L) 10/30/2017   CHOLHDL 3.1 10/30/2017   LDLCALC 85 10/30/2017   LDLCALC 189 (H) 07/10/2017   Lab Results  Component Value Date   TSH 0.98 10/30/2017   TSH 1.23 11/06/2016   TSH 1.23 11/06/2016    Therapeutic Level Labs: No results found for: LITHIUM No results found for: VALPROATE No components found for:  CBMZ  Current Medications: Current Outpatient Medications  Medication Sig Dispense Refill  . aspirin EC 81 MG tablet Take 81 mg by mouth daily.    Marland Kitchen atorvastatin (LIPITOR) 20 MG tablet Take 1 tablet (20 mg total) by mouth daily. 90 tablet 3  . Biotin 5 MG TABS Use one po qd  0  . [START ON 07/13/2020] buPROPion (WELLBUTRIN XL) 150 MG 24 hr tablet Total of 450 mg daily, along with 300 mg tab 90 tablet 0  . [START ON 07/13/2020] buPROPion (WELLBUTRIN XL) 300 MG 24 hr tablet Take total of 450 mg daily, along with 150 mg tab 90 tablet 0  . chlorhexidine (PERIDEX) 0.12 % solution 5 mLs by Mouth Rinse route 2 (two) times daily.    . Cholecalciferol (D3-1000) 1000 units capsule Take 4,000 Units by mouth daily.    Marland Kitchen gabapentin (NEURONTIN) 300 MG capsule TAKE TWO (2) CAPSULES BY MOUTH  THREE TIMES DAILY. 180 capsule 1  . hydrochlorothiazide (HYDRODIURIL) 25 MG tablet Take 1 tablet (25 mg total) by mouth daily. 90 tablet 1  . meloxicam (MOBIC) 15 MG tablet Take 1 tablet (15 mg total) by mouth daily. 30 tablet 3  . metoprolol succinate (TOPROL-XL) 50 MG 24 hr tablet Take 1 tablet (50 mg total) by mouth daily. Take with or immediately following a meal. 90 tablet 0  . Multiple Vitamin (MULTIVITAMIN) capsule Take 1 capsule by mouth daily.    Derrill Memo ON 07/13/2020] traZODone (DESYREL) 100 MG tablet Take 1 tablet (100 mg total) by mouth at bedtime as  needed for sleep. Take 1 tablets by mouth every night as directed 90 tablet 1  . [START ON 07/13/2020] venlafaxine XR (EFFEXOR-XR) 150 MG 24 hr capsule 225 mg daily (150 mg + 75 mg) 90 capsule 1  . [START ON 07/13/2020] venlafaxine XR (EFFEXOR-XR) 75 MG 24 hr capsule 225 mg daily (150 mg + 75 mg ) 90 capsule 1   No current facility-administered medications for this visit.     Musculoskeletal: Strength & Muscle Tone: N/A Gait & Station: N/A Patient leans: N/A  Psychiatric Specialty Exam: Review of Systems  Psychiatric/Behavioral: Positive for decreased concentration and dysphoric mood. Negative for agitation, behavioral problems, confusion, hallucinations, self-injury, sleep disturbance and suicidal ideas. The patient is nervous/anxious. The patient is not hyperactive.   All other systems reviewed and are negative.   There were no vitals taken for this visit.There is no height or weight on file to calculate BMI.  General Appearance: Fairly Groomed  Eye Contact:  Good  Speech:  Clear and Coherent  Volume:  Normal  Mood:  same  Affect:  Appropriate, Congruent and euthymic, occasionally down at times, but reactive  Thought Process:  Coherent  Orientation:  Full (Time, Place, and Person)  Thought Content: Logical   Suicidal Thoughts:  No  Homicidal Thoughts:  No  Memory:  Immediate;   Good  Judgement:  Good  Insight:  Good   Psychomotor Activity:  Normal  Concentration:  Concentration: Good and Attention Span: Good  Recall:  Good  Fund of Knowledge: Good  Language: Good  Akathisia:  No  Handed:  Right  AIMS (if indicated): not done  Assets:  Communication Skills Desire for Improvement  ADL's:  Intact  Cognition: WNL  Sleep:  Good   Screenings: GAD-7   Animal nutritionist BH Phone Follow Up from 10/29/2017 in [place] Phone Follow Up from 09/25/2017 in [place] Phone Follow Up from 05/28/2017 in [place]  Total GAD-7 Score 12 17 14     PHQ2-9   Flowsheet Row Video Visit from 07/11/2020 in [place] Video Visit from 04/13/2020 in [place] Office Visit from 11/05/2018 in [place] Office Visit from 11/05/2017 in [place] [facility] Phone Follow Up from 10/29/2017 in [facility] [facility]  PHQ-2 Total Score 1 2 2 2 6   PHQ-9 Total Score -- 2 5 5 15     Flowsheet Row Video Visit from 04/13/2020 in [place] [facility] Phone Follow Up from 10/29/2017 in [facility] Phone Follow Up from 09/25/2017 in [facility]  C-SSRS RISK CATEGORY No Risk No Risk No Risk       Assessment and Plan:  Tiaria Biby is a 65 y.o. year old female with a history of depression,meralgia paresthetica, hyperlipidemia, who presents for follow up appointment for below.   1. MDD (major depressive disorder), recurrent, in partial remission ([place]) Although she reports occasional depressive symptoms, she has been managing things fairly well since the last visit.  Psychosocial stressors includes taking care of her mother with Alzheimer disease,and her uncle with dialysis.  Will continue venlafaxine in the bupropion to target depression. Coached behavioral activation.  Although she will  greatly benefit from CBT, she is not interested in seeing a therapist again as she felt it was "more work."  2. Insomnia, unspecified type She reports good benefit from trazodone.  Will continue current dose to target  insomnia.   Plan I have reviewed and updated plans as below 1.Continuevenlafaxine 225 mg daily (150 mg + 75 mg) 2.Continuebupropion450mg  daily  3. Continue Trazodone 100 mg at night 4.Next appointment: 8/23 at 1:20 for 20 mins, video  - on gabapentin 600 mg three times a day - She was seen for annual visit in may 2022  Past trials of medication:sertraline, fluoxetine, venlafaxine, xanax, clonazepam, temazepam, Ambien  The patient demonstrates the following risk factors for suicide: Chronic risk factors for suicide include:psychiatric disorder ofdepression. Acute risk factorsfor suicide include: N/A. Protective factorsfor this patient include: coping skills and hope for the future. Considering these factors, the overall suicide risk at this point appears to below. Patientisappropriate for outpatient follow up.  [name], MD 07/11/2020, 1:51 PM

## 2020-07-11 ENCOUNTER — Encounter: Payer: Self-pay | Admitting: Psychiatry

## 2020-07-11 ENCOUNTER — Other Ambulatory Visit: Payer: Self-pay

## 2020-07-11 ENCOUNTER — Telehealth (INDEPENDENT_AMBULATORY_CARE_PROVIDER_SITE_OTHER): Payer: Commercial Managed Care - PPO | Admitting: Psychiatry

## 2020-07-11 DIAGNOSIS — G4700 Insomnia, unspecified: Secondary | ICD-10-CM

## 2020-07-11 DIAGNOSIS — F3341 Major depressive disorder, recurrent, in partial remission: Secondary | ICD-10-CM | POA: Diagnosis not present

## 2020-07-11 MED ORDER — BUPROPION HCL ER (XL) 300 MG PO TB24: ORAL_TABLET | ORAL | 0 refills | Status: DC

## 2020-07-11 MED ORDER — BUPROPION HCL ER (XL) 150 MG PO TB24: ORAL_TABLET | ORAL | 0 refills | Status: DC

## 2020-07-11 MED ORDER — VENLAFAXINE HCL ER 75 MG PO CP24: ORAL_CAPSULE | ORAL | 1 refills | Status: DC

## 2020-07-11 MED ORDER — VENLAFAXINE HCL ER 150 MG PO CP24: ORAL_CAPSULE | ORAL | 1 refills | Status: DC

## 2020-07-11 MED ORDER — TRAZODONE HCL 100 MG PO TABS
100.0000 mg | ORAL_TABLET | Freq: Every evening | ORAL | 1 refills | Status: DC | PRN
Start: 2020-07-13 — End: 2020-10-10

## 2020-07-11 NOTE — Patient Instructions (Signed)
1.Continuevenlafaxine 225 mg daily (150 mg + 75 mg) 2.Continuebupropion450mg  daily  3. Continue Trazodone 100 mg at night 4.Next appointment: 8/23 at 1:20

## 2020-10-06 NOTE — Progress Notes (Signed)
Virtual Visit via Telephone Note  I connected with [name] on 10/10/20 at  1:20 PM EDT by telephone and verified that I am speaking with the correct person using two identifiers.  Location: Patient: home Provider: office Persons participated in the visit- patient, provider    I discussed the limitations, risks, security and privacy concerns of performing an evaluation and management service by telephone and the availability of in person appointments. I also discussed with the patient that there may be a patient responsible charge related to this service. The patient expressed understanding and agreed to proceed.   I discussed the assessment and treatment plan with the patient. The patient was provided an opportunity to ask questions and all were answered. The patient agreed with the plan and demonstrated an understanding of the instructions.   The patient was advised to call back or seek an in-person evaluation if the symptoms worsen or if the condition fails to improve as anticipated.  I provided 13 minutes of non-face-to-face time during this encounter.   [name], MD    [facility] MD/PA/NP OP Progress Note  10/10/2020 1:47 PM [name]  MRN:  [id number]  Chief Complaint:  Chief Complaint   Follow-up; Depression    HPI:  This is a follow-up appointment for depression and insomnia.  She states that everything is the same.  She continues to do the caregiving.  They have been doing the same as well.  Although she occasionally feels down, she continues to enjoy gardening.  She occasionally feels anxious due to "thought process," referring to the thoughts about her mother.  She tends to feel stressed as she feels time is running around, and rushed.  Although she notices that she tends to be more down when she is leaving her house, she usually feels better when she is at home.  She denies anhedonia.  She was having insomnia, and self uptitrated trazodone with good benefit.  She  denies change in weight or appetite.  She has good concentration.  She denies SI.  She denies panic attacks.    Daily routine: taking care of her mother with alzheimer, her uncle who is on dialysis Employment: unemployed, used to work at [employer]: husband Marital status:married since 2008 Number of children:0     Visit Diagnosis:    ICD-10-CM   1. MDD (major depressive disorder), recurrent, in partial remission ([place])  F33.41     2. Insomnia, unspecified type  G47.00 traZODone (DESYREL) 150 MG tablet      Past Psychiatric History: Please see initial evaluation for full details. I have reviewed the history. No updates at this time.     Past Medical History:  Past Medical History:  Diagnosis Date   Anxiety    Depression    GERD (gastroesophageal reflux disease)    Hypertension    Meralgia paresthetica of right side 06/05/2017    Past Surgical History:  Procedure Laterality Date   KNEE CARTILAGE SURGERY Right    Workers Compensation, fell out of chair at work.     Family Psychiatric History: Please see initial evaluation for full details. I have reviewed the history. No updates at this time.     Family History:  Family History  Problem Relation Age of Onset   Alzheimer's disease Mother    Hypertension Sister    Diabetes Maternal Uncle    Hypertension Sister     Social History:  Social History   Socioeconomic History   Marital status: Married  Spouse name: Not on file   Number of children: Not on file   Years of education: Not on file   Highest education level: Not on file  Occupational History   Not on file  Tobacco Use   Smoking status: Former    Types: Cigarettes    Quit date: 10/13/1983    Years since quitting: 37.0   Smokeless tobacco: Never  Substance and Sexual Activity   Alcohol use: No   Drug use: No   Sexual activity: Not Currently    Birth control/protection: Post-menopausal  Other Topics Concern   Not on file  Social  History Narrative   Lives   Caffeine use:    Married. 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    Spends every 3rd night at [facility].    Has been taking care of mother since 44.    Social Determinants of Health   Financial Resource Strain: Not on file  Food Insecurity: Not on file  Transportation Needs: Not on file  Physical Activity: Not on file  Stress: Not on file  Social Connections: Not on file    Allergies: No Known Allergies  Metabolic Disorder Labs: Lab Results  Component Value Date   HGBA1C 5.5 11/06/2016   HGBA1C 5.5 11/06/2016   MPG 111 11/06/2016   No results found for: PROLACTIN Lab Results  Component Value Date   CHOL 149 10/30/2017   TRIG 73 10/30/2017   HDL 48 (L) 10/30/2017   CHOLHDL 3.1 10/30/2017   LDLCALC 85 10/30/2017   LDLCALC 189 (H) 07/10/2017   Lab Results  Component Value Date   TSH 0.98 10/30/2017   TSH 1.23 11/06/2016   TSH 1.23 11/06/2016    Therapeutic Level Labs: No results found for: LITHIUM No results found for: VALPROATE No components found for:  CBMZ  Current Medications: Current Outpatient Medications  Medication Sig Dispense Refill   aspirin EC 81 MG tablet Take 81 mg by mouth daily.     atorvastatin (LIPITOR) 20 MG tablet Take 1 tablet (20 mg total) by mouth daily. 90 tablet 3   Biotin 5 MG TABS Use one po qd  0   buPROPion (WELLBUTRIN XL) 150 MG 24 hr tablet Total of 450 mg daily, along with 300 mg tab 90 tablet 2   buPROPion (WELLBUTRIN XL) 300 MG 24 hr tablet Take total of 450 mg daily, along with 150 mg tab 90 tablet 2   chlorhexidine (PERIDEX) 0.12 % solution 5 mLs by Mouth Rinse route 2 (two) times daily.     Cholecalciferol (D3-1000) 1000 units capsule Take 4,000 Units by mouth daily.     gabapentin (NEURONTIN) 300 MG capsule TAKE TWO (2) CAPSULES BY MOUTH THREE TIMES DAILY. 180 capsule 1   hydrochlorothiazide (HYDRODIURIL) 25 MG tablet Take 1 tablet (25 mg total) by mouth daily. 90 tablet 1    meloxicam (MOBIC) 15 MG tablet Take 1 tablet (15 mg total) by mouth daily. 30 tablet 3   metoprolol succinate (TOPROL-XL) 50 MG 24 hr tablet Take 1 tablet (50 mg total) by mouth daily. Take with or immediately following a meal. 90 tablet 0   Multiple Vitamin (MULTIVITAMIN) capsule Take 1 capsule by mouth daily.     traZODone (DESYREL) 150 MG tablet Take 1 tablet (150 mg total) by mouth at bedtime as needed for sleep. 90 tablet 0   venlafaxine XR (EFFEXOR-XR) 150 MG 24 hr capsule 225 mg daily (150 mg + 75 mg) 90 capsule 1  venlafaxine XR (EFFEXOR-XR) 75 MG 24 hr capsule 225 mg daily (150 mg + 75 mg ) 90 capsule 1   No current facility-administered medications for this visit.     Musculoskeletal: Strength & Muscle Tone:  N/A Gait & Station:  N/A Patient leans: N/A  Psychiatric Specialty Exam: Review of Systems  Psychiatric/Behavioral:  Positive for dysphoric mood and sleep disturbance. Negative for agitation, behavioral problems, confusion, decreased concentration, hallucinations, self-injury and suicidal ideas. The patient is nervous/anxious. The patient is not hyperactive.   All other systems reviewed and are negative.  There were no vitals taken for this visit.There is no height or weight on file to calculate BMI.  General Appearance: NA  Eye Contact:  NA  Speech:  Clear and Coherent  Volume:  Normal  Mood:   same  Affect:  NA  Thought Process:  Coherent  Orientation:  Full (Time, Place, and Person)  Thought Content: Logical   Suicidal Thoughts:  No  Homicidal Thoughts:  No  Memory:  Immediate;   Good  Judgement:  Good  Insight:  Good  Psychomotor Activity:  Normal  Concentration:  Concentration: Good and Attention Span: Good  Recall:  Good  Fund of Knowledge: Good  Language: Good  Akathisia:  No  Handed:  Right  AIMS (if indicated): not done  Assets:  Communication Skills Desire for Improvement  ADL's:  Intact  Cognition: WNL  Sleep:  Fair   Screenings: GAD-7     Animal nutritionist [place] Phone Follow Up from 10/29/2017 in [place] Phone Follow Up from 09/25/2017 in [facility] Phone Follow Up from 05/28/2017 in [place]  Total GAD-7 Score '12 17 14      '$ PHQ2-9    Flowsheet Row Video Visit from 10/10/2020 in [place] Video Visit from 07/11/2020 in [place] Video Visit from 04/13/2020 in [place] Office Visit from 11/05/2018 in [place] Office Visit from 11/05/2017 in [facility]  PHQ-2 Total Score '1 1 2 2 2  '$ PHQ-9 Total Score -- -- '2 5 5      '$ Flowsheet Row Video Visit from 10/10/2020 in [place] Video Visit from 04/13/2020 in [place] Phone Follow Up from 10/29/2017 in [facility]  C-SSRS RISK CATEGORY No Risk No Risk No Risk        Assessment and Plan:  [name] is a 65 y.o. year old female with a history of depression,meralgia paresthetica, hyperlipidemia, who presents for follow up appointment for below.   1. MDD (major depressive disorder), recurrent, in partial remission ([place]) She denies significant mood symptoms except occasional anxiety and depressed mood since the last visit. Psychosocial stressors includes taking care of her mother with Alzheimer disease, and her uncle with dialysis.  Will continue venlafaxine and bupropion to target depression.  Noted that although she will greatly benefit from CBT, she is not interested in seeing a therapist again as she felt it was "more work."  2. Insomnia, unspecified type She says uptitrate trazodone, and has good benefit from the current dose.  We uptitrate the dose to target insomnia.     Plan I have reviewed and updated plans as below  1. Continue venlafaxine 225 mg daily (150 mg + 75  mg) 2. Continue  bupropion 450 mg daily  3.Increase Trazodone 150 mg at night  4. Next appointment: 11/21 at  1:20 for 20 mins, video  - on gabapentin 600 mg three times a day - She was seen for annual visit in may 2022    Past trials of medication: sertraline, fluoxetine, venlafaxine, xanax, clonazepam, temazepam, Ambien   The patient demonstrates the following risk factors for suicide: Chronic risk factors for suicide include: psychiatric disorder of depression. Acute risk factors for suicide include: N/A. Protective factors for this patient include: coping skills and hope for the future. Considering these factors, the overall suicide risk at this point appears to be low. Patient is appropriate for outpatient follow up.     [name], MD 10/10/2020, 1:47 PM

## 2020-10-10 ENCOUNTER — Other Ambulatory Visit: Payer: Self-pay

## 2020-10-10 ENCOUNTER — Telehealth (INDEPENDENT_AMBULATORY_CARE_PROVIDER_SITE_OTHER): Payer: Commercial Managed Care - PPO | Admitting: Psychiatry

## 2020-10-10 ENCOUNTER — Encounter: Payer: Self-pay | Admitting: Psychiatry

## 2020-10-10 DIAGNOSIS — G4700 Insomnia, unspecified: Secondary | ICD-10-CM | POA: Diagnosis not present

## 2020-10-10 DIAGNOSIS — F3341 Major depressive disorder, recurrent, in partial remission: Secondary | ICD-10-CM

## 2020-10-10 MED ORDER — TRAZODONE HCL 150 MG PO TABS: 150.0000 mg | ORAL_TABLET | Freq: Every evening | ORAL | 0 refills | Status: DC | PRN

## 2020-10-10 MED ORDER — BUPROPION HCL ER (XL) 300 MG PO TB24: ORAL_TABLET | ORAL | 2 refills | Status: DC

## 2020-10-10 MED ORDER — BUPROPION HCL ER (XL) 150 MG PO TB24: ORAL_TABLET | ORAL | 2 refills | Status: DC

## 2020-10-10 NOTE — Patient Instructions (Signed)
1. Continue venlafaxine 225 mg daily (150 mg + 75 mg) 2. Continue  bupropion 450 mg daily  3.Increase Trazodone 150 mg at night  4. Next appointment: 11/21 at 1:20

## 2021-01-05 NOTE — Progress Notes (Deleted)
[place] MD/PA/NP OP Progress Note  01/05/2021 5:33 AM [name]  MRN:  [id number]  Chief Complaint:  HPI: *** Visit Diagnosis: No diagnosis found.  Past Psychiatric History: Please see initial evaluation for full details. I have reviewed the history. No updates at this time.     Past Medical History:  Past Medical History:  Diagnosis Date   Anxiety    Depression    GERD (gastroesophageal reflux disease)    Hypertension    Meralgia paresthetica of right side 06/05/2017    Past Surgical History:  Procedure Laterality Date   KNEE CARTILAGE SURGERY Right    Workers Compensation, fell out of chair at work.     Family Psychiatric History: Please see initial evaluation for full details. I have reviewed the history. No updates at this time.     Family History:  Family History  Problem Relation Age of Onset   Alzheimer's disease Mother    Hypertension Sister    Diabetes Maternal Uncle    Hypertension Sister     Social History:  Social History   Socioeconomic History   Marital status: Married    Spouse name: Not on file   Number of children: Not on file   Years of education: Not on file   Highest education level: Not on file  Occupational History   Not on file  Tobacco Use   Smoking status: Former    Types: Cigarettes    Quit date: 10/13/1983    Years since quitting: 37.2   Smokeless tobacco: Never  Substance and Sexual Activity   Alcohol use: No   Drug use: No   Sexual activity: Not Currently    Birth control/protection: Post-menopausal  Other Topics Concern   Not on file  Social History Narrative   Lives   Caffeine use:    Married. 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    Spends every 3rd night at [employer].    Has been taking care of mother since 18.    Social Determinants of Health   Financial Resource Strain: Not on file  Food Insecurity: Not on file  Transportation Needs: Not on file  Physical Activity: Not on file  Stress: Not on  file  Social Connections: Not on file    Allergies: No Known Allergies  Metabolic Disorder Labs: Lab Results  Component Value Date   HGBA1C 5.5 11/06/2016   HGBA1C 5.5 11/06/2016   MPG 111 11/06/2016   No results found for: PROLACTIN Lab Results  Component Value Date   CHOL 149 10/30/2017   TRIG 73 10/30/2017   HDL 48 (L) 10/30/2017   CHOLHDL 3.1 10/30/2017   LDLCALC 85 10/30/2017   LDLCALC 189 (H) 07/10/2017   Lab Results  Component Value Date   TSH 0.98 10/30/2017   TSH 1.23 11/06/2016   TSH 1.23 11/06/2016    Therapeutic Level Labs: No results found for: LITHIUM No results found for: VALPROATE No components found for:  CBMZ  Current Medications: Current Outpatient Medications  Medication Sig Dispense Refill   aspirin EC 81 MG tablet Take 81 mg by mouth daily.     atorvastatin (LIPITOR) 20 MG tablet Take 1 tablet (20 mg total) by mouth daily. 90 tablet 3   Biotin 5 MG TABS Use one po qd  0   buPROPion (WELLBUTRIN XL) 150 MG 24 hr tablet Total of 450 mg daily, along with 300 mg tab 90 tablet 2   buPROPion (WELLBUTRIN XL) 300 MG 24 hr  tablet Take total of 450 mg daily, along with 150 mg tab 90 tablet 2   chlorhexidine (PERIDEX) 0.12 % solution 5 mLs by Mouth Rinse route 2 (two) times daily.     Cholecalciferol (D3-1000) 1000 units capsule Take 4,000 Units by mouth daily.     gabapentin (NEURONTIN) 300 MG capsule TAKE TWO (2) CAPSULES BY MOUTH THREE TIMES DAILY. 180 capsule 1   hydrochlorothiazide (HYDRODIURIL) 25 MG tablet Take 1 tablet (25 mg total) by mouth daily. 90 tablet 1   meloxicam (MOBIC) 15 MG tablet Take 1 tablet (15 mg total) by mouth daily. 30 tablet 3   metoprolol succinate (TOPROL-XL) 50 MG 24 hr tablet Take 1 tablet (50 mg total) by mouth daily. Take with or immediately following a meal. 90 tablet 0   Multiple Vitamin (MULTIVITAMIN) capsule Take 1 capsule by mouth daily.     traZODone (DESYREL) 150 MG tablet Take 1 tablet (150 mg total) by mouth at  bedtime as needed for sleep. 90 tablet 0   venlafaxine XR (EFFEXOR-XR) 150 MG 24 hr capsule 225 mg daily (150 mg + 75 mg) 90 capsule 1   venlafaxine XR (EFFEXOR-XR) 75 MG 24 hr capsule 225 mg daily (150 mg + 75 mg ) 90 capsule 1   No current facility-administered medications for this visit.     Musculoskeletal: Strength & Muscle Tone:  N/A Gait & Station:  N/A Patient leans: N/A  Psychiatric Specialty Exam: Review of Systems  There were no vitals taken for this visit.There is no height or weight on file to calculate BMI.  General Appearance: {Appearance:22683}  Eye Contact:  {BHH EYE CONTACT:22684}  Speech:  Clear and Coherent  Volume:  Normal  Mood:  {BHH MOOD:22306}  Affect:  {Affect (PAA):22687}  Thought Process:  Coherent  Orientation:  Full (Time, Place, and Person)  Thought Content: Logical   Suicidal Thoughts:  {ST/HT (PAA):22692}  Homicidal Thoughts:  {ST/HT (PAA):22692}  Memory:  Immediate;   Good  Judgement:  {Judgement (PAA):22694}  Insight:  {Insight (PAA):22695}  Psychomotor Activity:  Normal  Concentration:  Concentration: Good and Attention Span: Good  Recall:  Good  Fund of Knowledge: Good  Language: Good  Akathisia:  No  Handed:  Right  AIMS (if indicated): not done  Assets:  Communication Skills Desire for Improvement  ADL's:  Intact  Cognition: WNL  Sleep:  {B[facility] GOOD/FAIR/POOR:22877}   Screenings: GAD-7    Animal nutritionist [facility] Phone Follow Up from 10/29/2017 in [place] [facility] Phone Follow Up from 09/25/2017 in [facility] Phone Follow Up from 05/28/2017 in [place]  Total GAD-7 Score 12 17 14       PHQ2-9    Flowsheet Row Video Visit from 10/10/2020 in [place] Video Visit from 07/11/2020 in [place] Video Visit from 04/13/2020 in [place] Office Visit from 11/05/2018 in [place] Office Visit from 11/05/2017 in [facility]  PHQ-2 Total Score 1 1 2 2 2   PHQ-9 Total Score -- -- 2 5 5       Flowsheet Row Video Visit from 10/10/2020 in [place] Video Visit from 04/13/2020 in [place] [facility] Phone Follow Up from 10/29/2017 in [facility]  C-SSRS RISK CATEGORY No Risk No Risk No Risk        Assessment and Plan:  [name] is a 65 y.o. year old female  with a history of depression,meralgia paresthetica, hyperlipidemia, who presents for follow up appointment for below.     1. MDD (major depressive disorder), recurrent, in partial remission ([place]) She denies significant mood symptoms except occasional anxiety and depressed mood since the last visit. Psychosocial stressors includes taking care of her mother with Alzheimer disease, and her uncle with dialysis.  Will continue venlafaxine and bupropion to target depression.  Noted that although she will greatly benefit from CBT, she is not interested in seeing a therapist again as she felt it was "more work."   2. Insomnia, unspecified type She says uptitrate trazodone, and has good benefit from the current dose.  We uptitrate the dose to target insomnia.      Plan  1. Continue venlafaxine 225 mg daily (150 mg + 75 mg) 2. Continue  bupropion 450 mg daily  3.Increase Trazodone 150 mg at night  4. Next appointment: 11/21 at 1:20 for 20 mins, video  - on gabapentin 600 mg three times a day - She was seen for annual visit in may 2022    Past trials of medication: sertraline, fluoxetine, venlafaxine, xanax, clonazepam, temazepam, Ambien   The patient demonstrates the following risk factors for suicide: Chronic risk factors for suicide include: psychiatric disorder of depression. Acute risk factors for suicide include: N/A. Protective factors for this patient include: coping skills and hope for  the future. Considering these factors, the overall suicide risk at this point appears to be low. Patient is appropriate for outpatient follow up.      [name], MD 01/05/2021, 5:33 AM

## 2021-01-08 ENCOUNTER — Telehealth: Payer: Commercial Managed Care - PPO | Admitting: Psychiatry

## 2021-01-15 NOTE — Progress Notes (Signed)
Virtual Visit via Telephone Note  I connected with [name] on 01/17/21 at 10:00 AM EST by telephone and verified that I am speaking with the correct person using two identifiers.  Location: Patient: home Provider: office Persons participated in the visit- patient, provider    I discussed the limitations, risks, security and privacy concerns of performing an evaluation and management service by telephone and the availability of in person appointments. I also discussed with the patient that there may be a patient responsible charge related to this service. The patient expressed understanding and agreed to proceed.    I discussed the assessment and treatment plan with the patient. The patient was provided an opportunity to ask questions and all were answered. The patient agreed with the plan and demonstrated an understanding of the instructions.   The patient was advised to call back or seek an in-person evaluation if the symptoms worsen or if the condition fails to improve as anticipated.  I provided 14 minutes of non-face-to-face time during this encounter.   Renee Clay, MD    [facility] MD/PA/NP OP Progress Note  01/17/2021 10:34 AM [name]  MRN:  [id number]  Chief Complaint:  Chief Complaint   Follow-up; Depression    HPI:  This is a follow-up appointment for depression and insomnia.  She states that she has been busy and stressful.  She tends to be rushing to be at a certain place, forgetting things done.  Although she feels tired at times, she is able to do what she needs to do.  She had a good time on Thanksgiving at her mother's place.  Her mother has been doing the same.  She has fair relationship with her husband.  She has ankle pain, and was seen by orthopedic surgeon.  She states better since being on the higher dose of trazodone.  She feels down at times.  She has mild anhedonia at times.  She has fair concentration.  She may have increased appetite, although she is  not aware of any weight gain.  She denies SI.  She feels anxious at times.  She denies panic attacks.  She feels comfortable to stay on the current dose of medication.   Daily routine: taking care of her mother with alzheimer, her uncle who is on dialysis Employment: unemployed, used to work at [employer]: husband Marital status:married since 2008 Number of children:0   Visit Diagnosis:    ICD-10-CM   1. Insomnia, unspecified type  G47.00 traZODone (DESYREL) 150 MG tablet    venlafaxine XR (EFFEXOR-XR) 150 MG 24 hr capsule    venlafaxine XR (EFFEXOR-XR) 75 MG 24 hr capsule    2. MDD (major depressive disorder), recurrent episode, mild (HCC)  F33.0 venlafaxine XR (EFFEXOR-XR) 150 MG 24 hr capsule    venlafaxine XR (EFFEXOR-XR) 75 MG 24 hr capsule      Past Psychiatric History: Please see initial evaluation for full details. I have reviewed the history. No updates at this time.     Past Medical History:  Past Medical History:  Diagnosis Date   Anxiety    Depression    GERD (gastroesophageal reflux disease)    Hypertension    Meralgia paresthetica of right side 06/05/2017    Past Surgical History:  Procedure Laterality Date   KNEE CARTILAGE SURGERY Right    Workers Compensation, fell out of chair at work.     Family Psychiatric History: Please see initial evaluation for full details. I have reviewed the history. No  updates at this time.     Family History:  Family History  Problem Relation Age of Onset   Alzheimer's disease Mother    Hypertension Sister    Diabetes Maternal Uncle    Hypertension Sister     Social History:  Social History   Socioeconomic History   Marital status: Married    Spouse name: Not on file   Number of children: Not on file   Years of education: Not on file   Highest education level: Not on file  Occupational History   Not on file  Tobacco Use   Smoking status: Former    Types: Cigarettes    Quit date: 10/13/1983     Years since quitting: 37.2   Smokeless tobacco: Never  Substance and Sexual Activity   Alcohol use: No   Drug use: No   Sexual activity: Not Currently    Birth control/protection: Post-menopausal  Other Topics Concern   Not on file  Social History Narrative   Lives   Caffeine use:    Married. 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    Spends every 3rd night at [facility].    Has been taking care of mother since 68.    Social Determinants of Health   Financial Resource Strain: Not on file  Food Insecurity: Not on file  Transportation Needs: Not on file  Physical Activity: Not on file  Stress: Not on file  Social Connections: Not on file    Allergies: No Known Allergies  Metabolic Disorder Labs: Lab Results  Component Value Date   HGBA1C 5.5 11/06/2016   HGBA1C 5.5 11/06/2016   MPG 111 11/06/2016   No results found for: PROLACTIN Lab Results  Component Value Date   CHOL 149 10/30/2017   TRIG 73 10/30/2017   HDL 48 (L) 10/30/2017   CHOLHDL 3.1 10/30/2017   LDLCALC 85 10/30/2017   LDLCALC 189 (H) 07/10/2017   Lab Results  Component Value Date   TSH 0.98 10/30/2017   TSH 1.23 11/06/2016   TSH 1.23 11/06/2016    Therapeutic Level Labs: No results found for: LITHIUM No results found for: VALPROATE No components found for:  CBMZ  Current Medications: Current Outpatient Medications  Medication Sig Dispense Refill   aspirin EC 81 MG tablet Take 81 mg by mouth daily.     atorvastatin (LIPITOR) 20 MG tablet Take 1 tablet (20 mg total) by mouth daily. 90 tablet 3   Biotin 5 MG TABS Use one po qd  0   buPROPion (WELLBUTRIN XL) 150 MG 24 hr tablet Total of 450 mg daily, along with 300 mg tab 90 tablet 2   buPROPion (WELLBUTRIN XL) 300 MG 24 hr tablet Take total of 450 mg daily, along with 150 mg tab 90 tablet 2   chlorhexidine (PERIDEX) 0.12 % solution 5 mLs by Mouth Rinse route 2 (two) times daily.     Cholecalciferol (D3-1000) 1000 units capsule  Take 4,000 Units by mouth daily.     gabapentin (NEURONTIN) 300 MG capsule TAKE TWO (2) CAPSULES BY MOUTH THREE TIMES DAILY. 180 capsule 1   hydrochlorothiazide (HYDRODIURIL) 25 MG tablet Take 1 tablet (25 mg total) by mouth daily. 90 tablet 1   meloxicam (MOBIC) 15 MG tablet Take 1 tablet (15 mg total) by mouth daily. 30 tablet 3   metoprolol succinate (TOPROL-XL) 50 MG 24 hr tablet Take 1 tablet (50 mg total) by mouth daily. Take with or immediately following a meal. 90 tablet  0   Multiple Vitamin (MULTIVITAMIN) capsule Take 1 capsule by mouth daily.     traZODone (DESYREL) 150 MG tablet Take 1 tablet (150 mg total) by mouth at bedtime as needed for sleep. 90 tablet 1   venlafaxine XR (EFFEXOR-XR) 150 MG 24 hr capsule Take 1 capsule (150 mg total) by mouth daily. Total of 225 mg daily. Take along with 75 mg cap 90 capsule 1   venlafaxine XR (EFFEXOR-XR) 75 MG 24 hr capsule Take 1 capsule (75 mg total) by mouth daily. Total of 225 mg daily. Take along with 150 mg cap 90 capsule 1   No current facility-administered medications for this visit.     Musculoskeletal: Strength & Muscle Tone:  N/A Gait & Station:  N/A Patient leans: N/A  Psychiatric Specialty Exam: Review of Systems  Psychiatric/Behavioral:  Positive for decreased concentration and dysphoric mood. Negative for agitation, behavioral problems, confusion, hallucinations, self-injury, sleep disturbance and suicidal ideas. The patient is nervous/anxious. The patient is not hyperactive.   All other systems reviewed and are negative.  There were no vitals taken for this visit.There is no height or weight on file to calculate BMI.  General Appearance: NA  Eye Contact:  NA  Speech:  Clear and Coherent  Volume:  Normal  Mood:   stressful  Affect:  NA  Thought Process:  Coherent  Orientation:  Full (Time, Place, and Person)  Thought Content: Logical   Suicidal Thoughts:  No  Homicidal Thoughts:  No  Memory:  Immediate;   Good   Judgement:  Good  Insight:  Fair  Psychomotor Activity:  Normal  Concentration:  Concentration: Good and Attention Span: Good  Recall:  Good  Fund of Knowledge: Good  Language: Good  Akathisia:  No  Handed:  Right  AIMS (if indicated): not done  Assets:  Communication Skills Desire for Improvement  ADL's:  Intact  Cognition: WNL  Sleep:  Good   Screenings: GAD-7    Animal nutritionist BH Phone Follow Up from 10/29/2017 in [place] [facility] Phone Follow Up from 09/25/2017 in [place] [facility] Phone Follow Up from 05/28/2017 in [place]  Total GAD-7 Score 12 17 14       PHQ2-9    Flowsheet Row Video Visit from 10/10/2020 in [place] Video Visit from 07/11/2020 in [place] Video Visit from 04/13/2020 in [place] Office Visit from 11/05/2018 in [place] Office Visit from 11/05/2017 in [facility]  PHQ-2 Total Score 1 1 2 2 2   PHQ-9 Total Score -- -- 2 5 5       Flowsheet Row Video Visit from 10/10/2020 in [place] Video Visit from 04/13/2020 in [place] Phone Follow Up from 10/29/2017 in [place] No Risk No Risk No Risk        Assessment and Plan:  Liviah Cake is a 65 y.o. year old female with a history of depression,meralgia paresthetica, hyperlipidemia, who presents for follow up appointment for below.    2. MDD (major depressive disorder), recurrent episode, mild ([place]) Although she continues to report occasional depressed mood, it has been relatively manageable since the last visit. Psychosocial stressors includes taking care of her mother with Alzheimer disease, and her uncle with dialysis.  Will continue venlafaxine and bupropion to target depression. Noted  that although she will greatly benefit from CBT,  she is not interested in seeing a therapist again as she felt it was "more work."  1. Insomnia, unspecified type Improving.  She had a good benefit from higher dose of trazodone.  Will continue current dose to target insomnia.      Plan I have reviewed and updated plans as below  Continue venlafaxine 225 mg daily (150 mg + 75 mg) Continue  bupropion 450 mg daily  Continue Trazodone 150 mg at night  Next appointment: 2/27 at 2 PM for 30 mins, in person - on gabapentin 600 mg three times a day - She was seen for annual visit in may 2022    Past trials of medication: sertraline, fluoxetine, venlafaxine, xanax, clonazepam, temazepam, Ambien   The patient demonstrates the following risk factors for suicide: Chronic risk factors for suicide include: psychiatric disorder of depression. Acute risk factors for suicide include: N/A. Protective factors for this patient include: coping skills and hope for the future. Considering these factors, the overall suicide risk at this point appears to be low. Patient is appropriate for outpatient follow up.    Renee Clay, MD 01/17/2021, 10:34 AM

## 2021-01-17 ENCOUNTER — Telehealth (INDEPENDENT_AMBULATORY_CARE_PROVIDER_SITE_OTHER): Payer: 59 | Admitting: Psychiatry

## 2021-01-17 ENCOUNTER — Other Ambulatory Visit: Payer: Self-pay

## 2021-01-17 ENCOUNTER — Encounter: Payer: Self-pay | Admitting: Psychiatry

## 2021-01-17 DIAGNOSIS — F330 Major depressive disorder, recurrent, mild: Secondary | ICD-10-CM | POA: Diagnosis not present

## 2021-01-17 DIAGNOSIS — G4700 Insomnia, unspecified: Secondary | ICD-10-CM

## 2021-01-17 MED ORDER — VENLAFAXINE HCL ER 75 MG PO CP24: 75.0000 mg | ORAL_CAPSULE | Freq: Every day | ORAL | 1 refills | Status: DC

## 2021-01-17 MED ORDER — VENLAFAXINE HCL ER 150 MG PO CP24: 150.0000 mg | ORAL_CAPSULE | Freq: Every day | ORAL | 1 refills | Status: DC

## 2021-01-17 MED ORDER — TRAZODONE HCL 150 MG PO TABS: 150.0000 mg | ORAL_TABLET | Freq: Every evening | ORAL | 1 refills | Status: DC | PRN

## 2021-01-17 NOTE — Patient Instructions (Signed)
Continue venlafaxine 225 mg daily (150 mg + 75 mg) Continue  bupropion 450 mg daily  Continue Trazodone 150 mg at night  Next appointment: 2/27 at 2 PM   The next visit will be in person visit. Please arrive 15 mins before the scheduled time.   [facility]  Address: [address], [place], [place] [address]

## 2021-04-16 ENCOUNTER — Ambulatory Visit: Payer: Commercial Managed Care - PPO | Admitting: Psychiatry

## 2021-05-02 ENCOUNTER — Ambulatory Visit: Payer: Commercial Managed Care - PPO | Admitting: Psychiatry

## 2021-05-16 NOTE — Progress Notes (Signed)
BH MD/PA/NP OP Progress Note ? ?05/17/2021 3:14 PM ?[name]  ?MRN:  [id number] ? ?Chief Complaint:  ?Chief Complaint  ?Patient presents with  ? Follow-up  ? Depression  ? ?HPI:  ?This is a follow-up appointment for depression.  ?She states that she feels frustrated as she could not find the office.  She states that her mother passed away around Christmas.  She still thinks about it at times.  However, she thinks she has been doing fine.  She continues to take care of her uncle, who needs dialysis.  She has not been able to take a walk as much due to some cognition in her foot.  However, she agrees to going outside, and she is interested in yard work when the weather gets nicer.  She has depressive symptoms as in PHQ-9.  She denies SI.  She has middle insomnia without any reason.  She feels anxious at times.  She wants to stay on the medication as it is at this time.  She is not interested in therapy as it requires "more work" for her.  ? ?Daily routine: taking care of her maternal uncle who is on dialysis ?Employment: unemployed, used to work at [employer] ?Household: husband ?Marital status:married since 2008 ?Number of children:0  ? ?Wt Readings from Last 3 Encounters:  ?05/17/21 179 lb 9.6 oz (81.5 kg)  ?08/26/19 182 lb (82.6 kg)  ?07/15/19 186 lb (84.4 kg)  ?  ? ?Visit Diagnosis:  ?  ICD-10-CM   ?1. MDD (major depressive disorder), recurrent, in partial remission ([place])  F33.41   ?  ?2. Insomnia, unspecified type  G47.00 venlafaxine XR (EFFEXOR-XR) 150 MG 24 hr capsule  ?  venlafaxine XR (EFFEXOR-XR) 75 MG 24 hr capsule  ?  ?3. MDD (major depressive disorder), recurrent episode, mild (HCC)  F33.0 venlafaxine XR (EFFEXOR-XR) 150 MG 24 hr capsule  ?  venlafaxine XR (EFFEXOR-XR) 75 MG 24 hr capsule  ?  ? ? ?Past Psychiatric History: Please see initial evaluation for full details. I have reviewed the history. No updates at this time.  ?  ? ?Past Medical History:  ?Past Medical History:  ?Diagnosis Date  ?  Anxiety   ? Depression   ? GERD (gastroesophageal reflux disease)   ? Hypertension   ? Meralgia paresthetica of right side 06/05/2017  ?  ?Past Surgical History:  ?Procedure Laterality Date  ? KNEE CARTILAGE SURGERY Right   ? 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   ? ? ?Family Psychiatric History: Please see initial evaluation for full details. I have reviewed the history. No updates at this time.  ?  ? ?Family History:  ?Family History  ?Problem Relation Age of Onset  ? Alzheimer's disease Mother   ? Hypertension Sister   ? Diabetes Maternal Uncle   ? Hypertension Sister   ? ? ?Social History:  ?Social History  ? ?Socioeconomic History  ? Marital status: Married  ?  Spouse name: Not on file  ? Number of children: Not on file  ? Years of education: Not on file  ? Highest education level: Not on file  ?Occupational History  ? Not on file  ?Tobacco Use  ? Smoking status: Former  ?  Types: Cigarettes  ?  Quit date: 10/13/1983  ?  Years since quitting: 37.6  ? Smokeless tobacco: Never  ?Substance and Sexual Activity  ? Alcohol use: No  ? Drug use: No  ? Sexual activity: Not Currently  ?  Birth  control/protection: Post-menopausal  ?Other Topics Concern  ? Not on file  ?Social History Narrative  ? Lives  ? Caffeine use:   ? Married. 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   ? Spends every 3rd night at [facility].   ? Has been taking care of mother since 11.   ? ?Social Determinants of Health  ? ?Financial Resource Strain: Not on file  ?Food Insecurity: Not on file  ?Transportation Needs: Not on file  ?Physical Activity: Not on file  ?Stress: Not on file  ?Social Connections: Not on file  ? ? ?Allergies: No Known Allergies ? ?Metabolic Disorder Labs: ?Lab Results  ?Component Value Date  ? HGBA1C 5.5 11/06/2016  ? HGBA1C 5.5 11/06/2016  ? MPG 111 11/06/2016  ? ?No results found for: PROLACTIN ?Lab Results  ?Component Value Date  ? CHOL 149 10/30/2017  ? TRIG 73 10/30/2017  ? HDL 48 (L) 10/30/2017  ?  CHOLHDL 3.1 10/30/2017  ? [place] 85 10/30/2017  ? LDLCALC 189 (H) 07/10/2017  ? ?Lab Results  ?Component Value Date  ? TSH 0.98 10/30/2017  ? TSH 1.23 11/06/2016  ? TSH 1.23 11/06/2016  ? ? ?Therapeutic Level Labs: ?No results found for: LITHIUM ?No results found for: VALPROATE ?No components found for:  CBMZ ? ?Current Medications: ?Current Outpatient Medications  ?Medication Sig Dispense Refill  ? aspirin EC 81 MG tablet Take 81 mg by mouth daily.    ? atorvastatin (LIPITOR) 20 MG tablet Take 1 tablet (20 mg total) by mouth daily. 90 tablet 3  ? Biotin 5 MG TABS Use one po qd  0  ? buPROPion (WELLBUTRIN XL) 150 MG 24 hr tablet Total of 450 mg daily, along with 300 mg tab 90 tablet 2  ? buPROPion (WELLBUTRIN XL) 300 MG 24 hr tablet Take total of 450 mg daily, along with 150 mg tab 90 tablet 2  ? chlorhexidine (PERIDEX) 0.12 % solution 5 mLs by Mouth Rinse route 2 (two) times daily.    ? Cholecalciferol 25 MCG (1000 UT) capsule Take 4,000 Units by mouth daily.    ? gabapentin (NEURONTIN) 300 MG capsule TAKE TWO (2) CAPSULES BY MOUTH THREE TIMES DAILY. 180 capsule 1  ? hydrochlorothiazide (HYDRODIURIL) 25 MG tablet Take 1 tablet (25 mg total) by mouth daily. 90 tablet 1  ? meloxicam (MOBIC) 15 MG tablet Take 1 tablet (15 mg total) by mouth daily. 30 tablet 3  ? metoprolol succinate (TOPROL-XL) 50 MG 24 hr tablet Take 1 tablet (50 mg total) by mouth daily. Take with or immediately following a meal. 90 tablet 0  ? Multiple Vitamin (MULTIVITAMIN) capsule Take 1 capsule by mouth daily.    ? traZODone (DESYREL) 150 MG tablet Take 1 tablet (150 mg total) by mouth at bedtime as needed for sleep. 90 tablet 1  ? [START ON 07/17/2021] venlafaxine XR (EFFEXOR-XR) 150 MG 24 hr capsule Take 1 capsule (150 mg total) by mouth daily. Total of 225 mg daily. Take along with 75 mg cap 90 capsule 1  ? [START ON 07/17/2021] venlafaxine XR (EFFEXOR-XR) 75 MG 24 hr capsule Take 1 capsule (75 mg total) by mouth daily. Total of 225 mg daily.  Take along with 150 mg cap 90 capsule 1  ? ?No current facility-administered medications for this visit.  ? ? ? ?Musculoskeletal: ?Strength & Muscle Tone:  normal ?Gait & Station: normal ?Patient leans: N/A ? ?Psychiatric Specialty Exam: ?Review of Systems  ?Psychiatric/Behavioral:  Positive for dysphoric mood and  sleep disturbance. Negative for agitation, behavioral problems, confusion, decreased concentration, hallucinations, self-injury and suicidal ideas. The patient is nervous/anxious. The patient is not hyperactive.   ?All other systems reviewed and are negative.  ?Blood pressure (!) 158/76, pulse 91, temperature (!) 97.1 ?F (36.2 ?C), temperature source Temporal, weight 179 lb 9.6 oz (81.5 kg).Body mass index is 33.94 kg/m?.  ?General Appearance: Fairly Groomed  ?Eye Contact:  Good  ?Speech:  Clear and Coherent  ?Volume:  Normal  ?Mood:   fine  ?Affect:  Appropriate, Congruent, and euthymic  ?Thought Process:  Coherent  ?Orientation:  Full (Time, Place, and Person)  ?Thought Content: Logical   ?Suicidal Thoughts:  No  ?Homicidal Thoughts:  No  ?Memory:  Immediate;   Good  ?Judgement:  Good  ?Insight:  Good  ?Psychomotor Activity:  Normal  ?Concentration:  Concentration: Good and Attention Span: Good  ?Recall:  Good  ?Fund of Knowledge: Good  ?Language: Good  ?Akathisia:  No  ?Handed:  Right  ?AIMS (if indicated): not done  ?Assets:  Communication Skills ?Desire for Improvement  ?ADL's:  Intact  ?Cognition: WNL  ?Sleep:  Poor  ? ?Screenings: ?GAD-7   ? ?Flowsheet Row Virtual [place] Phone Follow Up from 10/29/2017 in [place] [facility] Phone Follow Up from 09/25/2017 in [place] Phone Follow Up from 05/28/2017 in [place]  ?Total GAD-7 Score '12 17 14  '$ ? ?  ? ?PHQ2-9   ? ?[place] Office Visit from 05/17/2021 in [place] Video Visit from 10/10/2020 in [place] Video Visit from 07/11/2020  in [place] Video Visit from 04/13/2020 in [place] Office Visit from 11/05/2018 in [facility]-  ?

## 2021-05-17 ENCOUNTER — Encounter: Payer: Self-pay | Admitting: Psychiatry

## 2021-05-17 ENCOUNTER — Ambulatory Visit (INDEPENDENT_AMBULATORY_CARE_PROVIDER_SITE_OTHER): Payer: 59 | Admitting: Psychiatry

## 2021-05-17 VITALS — BP 158/76 | HR 91 | Temp 97.1°F | Wt 179.6 lb

## 2021-05-17 DIAGNOSIS — F330 Major depressive disorder, recurrent, mild: Secondary | ICD-10-CM

## 2021-05-17 DIAGNOSIS — G4700 Insomnia, unspecified: Secondary | ICD-10-CM | POA: Diagnosis not present

## 2021-05-17 DIAGNOSIS — F3341 Major depressive disorder, recurrent, in partial remission: Secondary | ICD-10-CM

## 2021-05-17 MED ORDER — VENLAFAXINE HCL ER 150 MG PO CP24: 150.0000 mg | ORAL_CAPSULE | Freq: Every day | ORAL | 1 refills | Status: DC

## 2021-05-17 MED ORDER — VENLAFAXINE HCL ER 75 MG PO CP24: 75.0000 mg | ORAL_CAPSULE | Freq: Every day | ORAL | 1 refills | Status: DC

## 2021-05-17 NOTE — Patient Instructions (Signed)
Continue venlafaxine 225 mg daily (150 mg + 75 mg) ?Continue  bupropion 450 mg daily  ?Continue Trazodone 150 mg at night  ?Next appointment: 6/20 at 1:20 ?

## 2021-07-20 ENCOUNTER — Other Ambulatory Visit (HOSPITAL_COMMUNITY): Payer: Self-pay | Admitting: Family Medicine

## 2021-07-20 DIAGNOSIS — Z1231 Encounter for screening mammogram for malignant neoplasm of breast: Secondary | ICD-10-CM

## 2021-08-01 ENCOUNTER — Ambulatory Visit (HOSPITAL_COMMUNITY)
Admission: RE | Admit: 2021-08-01 | Discharge: 2021-08-01 | Disposition: A | Discharge status: Home / Self Care | Payer: Medicare HMO | Source: Ambulatory Visit | Attending: Family Medicine | Admitting: Family Medicine

## 2021-08-01 DIAGNOSIS — Z1231 Encounter for screening mammogram for malignant neoplasm of breast: Secondary | ICD-10-CM | POA: Diagnosis present

## 2021-08-05 NOTE — Progress Notes (Unsigned)
Virtual Visit via Telephone Note  I connected with [name] on 08/07/21 at  1:20 PM EDT by telephone and verified that I am speaking with the correct person using two identifiers.  Location: Patient: home Provider: office Persons participated in the visit- patient, provider    I discussed the limitations, risks, security and privacy concerns of performing an evaluation and management service by telephone and the availability of in person appointments. I also discussed with the patient that there may be a patient responsible charge related to this service. The patient expressed understanding and agreed to proceed.   I discussed the assessment and treatment plan with the patient. The patient was provided an opportunity to ask questions and all were answered. The patient agreed with the plan and demonstrated an understanding of the instructions.   The patient was advised to call back or seek an in-person evaluation if the symptoms worsen or if the condition fails to improve as anticipated.  I provided 12 minutes of non-face-to-face time during this encounter.   Renee Clay, MD    [facility] MD/PA/NP OP Progress Note  08/07/2021 1:44 PM [name]  MRN:  [id number]  Chief Complaint:  Chief Complaint  Patient presents with   Follow-up   Depression   HPI:  This is a follow-up appointment for depression and insomnia.  She states that there has been no change since the last visit.  She continues to take care of her uncle.  She has started to enjoy gardening.  Although she occasionally feels down due to her not being able to have time for herself, she does not feel depressed all the time anymore.  She has middle insomnia.  She reports weight gain, which she attributes to eating that healthy food.  She is willing to work on this.  She has not been able to take a walk due to foot pain, although she does not want to have a surgery. She has an upcoming appointment with her PCP in August.   She  denies SI.  She feels less anxious.  She feels comfortable to stay on the current medication regimen.   184 lbs Wt Readings from Last 3 Encounters:  05/17/21 179 lb 9.6 oz (81.5 kg)  08/26/19 182 lb (82.6 kg)  07/15/19 186 lb (84.4 kg)     Daily routine: taking care of her uncle who is on dialysis Employment: unemployed, used to work at [employer]: husband Marital status: married since 2008 Number of children:0     Visit Diagnosis:    ICD-10-CM   1. MDD (major depressive disorder), recurrent episode, mild ([place])  F33.0     2. Insomnia, unspecified type  G47.00 traZODone (DESYREL) 150 MG tablet      Past Psychiatric History: Please see initial evaluation for full details. I have reviewed the history. No updates at this time.     Past Medical History:  Past Medical History:  Diagnosis Date   Anxiety    Depression    GERD (gastroesophageal reflux disease)    Hypertension    Meralgia paresthetica of right side 06/05/2017    Past Surgical History:  Procedure Laterality Date   KNEE CARTILAGE SURGERY Right    Workers Compensation, fell out of chair at work.     Family Psychiatric History: Please see initial evaluation for full details. I have reviewed the history. No updates at this time.     Family History:  Family History  Problem Relation Age of Onset   Alzheimer's  disease Mother    Hypertension Sister    Diabetes Maternal Uncle    Hypertension Sister     Social History:  Social History   Socioeconomic History   Marital status: Married    Spouse name: Not on file   Number of children: Not on file   Years of education: Not on file   Highest education level: Not on file  Occupational History   Not on file  Tobacco Use   Smoking status: Former    Types: Cigarettes    Quit date: 10/13/1983    Years since quitting: 37.8   Smokeless tobacco: Never  Substance and Sexual Activity   Alcohol use: No   Drug use: No   Sexual activity: Not Currently     Birth control/protection: Post-menopausal  Other Topics Concern   Not on file  Social History Narrative   Lives   Caffeine use:    Married. 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    Spends every 3rd night at [facility].    Has been taking care of mother since 50.    Social Determinants of Health   Financial Resource Strain: Not on file  Food Insecurity: Not on file  Transportation Needs: Not on file  Physical Activity: Not on file  Stress: Not on file  Social Connections: Not on file    Allergies: No Known Allergies  Metabolic Disorder Labs: Lab Results  Component Value Date   HGBA1C 5.5 11/06/2016   HGBA1C 5.5 11/06/2016   MPG 111 11/06/2016   No results found for: "PROLACTIN" Lab Results  Component Value Date   CHOL 149 10/30/2017   TRIG 73 10/30/2017   HDL 48 (L) 10/30/2017   CHOLHDL 3.1 10/30/2017   LDLCALC 85 10/30/2017   LDLCALC 189 (H) 07/10/2017   Lab Results  Component Value Date   TSH 0.98 10/30/2017   TSH 1.23 11/06/2016   TSH 1.23 11/06/2016    Therapeutic Level Labs: No results found for: "LITHIUM" No results found for: "VALPROATE" Lab Results  Component Value Date   CBMZ 7.8 07/29/2017    Current Medications: Current Outpatient Medications  Medication Sig Dispense Refill   aspirin EC 81 MG tablet Take 81 mg by mouth daily.     atorvastatin (LIPITOR) 20 MG tablet Take 1 tablet (20 mg total) by mouth daily. 90 tablet 3   Biotin 5 MG TABS Use one po qd  0   buPROPion (WELLBUTRIN XL) 150 MG 24 hr tablet Total of 450 mg daily, along with 300 mg tab 90 tablet 2   buPROPion (WELLBUTRIN XL) 300 MG 24 hr tablet Take total of 450 mg daily, along with 150 mg tab 90 tablet 2   chlorhexidine (PERIDEX) 0.12 % solution 5 mLs by Mouth Rinse route 2 (two) times daily.     Cholecalciferol 25 MCG (1000 UT) capsule Take 4,000 Units by mouth daily.     gabapentin (NEURONTIN) 300 MG capsule TAKE TWO (2) CAPSULES BY MOUTH THREE TIMES DAILY.  180 capsule 1   hydrochlorothiazide (HYDRODIURIL) 25 MG tablet Take 1 tablet (25 mg total) by mouth daily. 90 tablet 1   meloxicam (MOBIC) 15 MG tablet Take 1 tablet (15 mg total) by mouth daily. 30 tablet 3   metoprolol succinate (TOPROL-XL) 50 MG 24 hr tablet Take 1 tablet (50 mg total) by mouth daily. Take with or immediately following a meal. 90 tablet 0   Multiple Vitamin (MULTIVITAMIN) capsule Take 1 capsule by mouth daily.  traZODone (DESYREL) 150 MG tablet Take 1 tablet (150 mg total) by mouth at bedtime as needed for sleep. 90 tablet 1   venlafaxine XR (EFFEXOR-XR) 150 MG 24 hr capsule Take 1 capsule (150 mg total) by mouth daily. Total of 225 mg daily. Take along with 75 mg cap 90 capsule 1   venlafaxine XR (EFFEXOR-XR) 75 MG 24 hr capsule Take 1 capsule (75 mg total) by mouth daily. Total of 225 mg daily. Take along with 150 mg cap 90 capsule 1   No current facility-administered medications for this visit.     Musculoskeletal: Strength & Muscle Tone:  N/A Gait & Station:  N/A Patient leans: N/A  Psychiatric Specialty Exam: Review of Systems  Neurological:  Positive for speech difficulty.  Psychiatric/Behavioral:  Positive for dysphoric mood and sleep disturbance. Negative for agitation, behavioral problems, confusion, decreased concentration, hallucinations, self-injury and suicidal ideas. The patient is nervous/anxious. The patient is not hyperactive.   All other systems reviewed and are negative.   There were no vitals taken for this visit.There is no height or weight on file to calculate BMI.  General Appearance: NA  Eye Contact:  NA  Speech:  Clear and Coherent  Volume:  Normal  Mood:   good  Affect:  NA  Thought Process:  Coherent  Orientation:  Full (Time, Place, and Person)  Thought Content: Logical   Suicidal Thoughts:  No  Homicidal Thoughts:  No  Memory:  Immediate;   Good  Judgement:  Good  Insight:  Good  Psychomotor Activity:  Normal  Concentration:   Concentration: Good and Attention Span: Good  Recall:  Good  Fund of Knowledge: Good  Language: Good  Akathisia:  No  Handed:  Right  AIMS (if indicated): not done  Assets:  Communication Skills Desire for Improvement  ADL's:  Intact  Cognition: WNL  Sleep:  Poor   Screenings: GAD-7    Animal nutritionist BH Phone Follow Up from 10/29/2017 in [place] [facility] Phone Follow Up from 09/25/2017 in [facility] Phone Follow Up from 05/28/2017 in [place]  Total GAD-7 Score '12 17 14      '$ PHQ2-9    [place] Office Visit from 05/17/2021 in [place] Video Visit from 10/10/2020 in [place] Video Visit from 07/11/2020 in [place] Video Visit from 04/13/2020 in [place] Office Visit from 11/05/2018 in [facility]  PHQ-2 Total Score '2 1 1 2 2  '$ PHQ-9 Total Score 4 -- -- 2 5      Flowsheet Row Video Visit from 10/10/2020 in [place] Video Visit from 04/13/2020 in [place] Phone Follow Up from 10/29/2017 in [facility]  C-SSRS RISK CATEGORY No Risk No Risk No Risk        Assessment and Plan:  Elvena Oyer is a 66 y.o. year old female with a history of depression,meralgia paresthetica, hyperlipidemia, who presents for follow up appointment for below.   1. MDD (major depressive disorder), recurrent episode, mild ([place]) Although she continues to report occasional depressed mood and anxiety, it has been improving overall since the last visit. Recent psychosocial stressors includes loss of her mother with Alzheimer disease.  Other psychosocial stressors includes taking care of her uncle with dialysis.  Will continue venlafaxine and bupropion to target depression. Although she will  greatly benefit from CBT, she is  not interested in seeing a therapist again as she felt it was "more work."  2. Insomnia, unspecified type She continues to report middle insomnia with occasional snoring.  She agrees for evaluation of sleep apnea.  Will continue trazodone as needed for insomnia.    Plan   Continue venlafaxine 225 mg daily (150 mg + 75 mg) Continue  bupropion 450 mg daily  Continue Trazodone 150 mg at night  Next appointment:  Referral for sleep evaluation - on gabapentin 600 mg three times a day - She was seen for annual visit in may 2022    Past trials of medication: sertraline, fluoxetine, venlafaxine, xanax, clonazepam, temazepam, Ambien   The patient demonstrates the following risk factors for suicide: Chronic risk factors for suicide include: psychiatric disorder of depression. Acute risk factors for suicide include: N/A. Protective factors for this patient include: coping skills and hope for the future. Considering these factors, the overall suicide risk at this point appears to be low. Patient is appropriate for outpatient follow up.          Collaboration of Care: Collaboration of Care: Other N/A  Patient/Guardian was advised Release of Information must be obtained prior to any record release in order to collaborate their care with an outside provider. Patient/Guardian was advised if they have not already done so to contact the registration department to sign all necessary forms in order for [place] to release information regarding their care.   Consent: Patient/Guardian gives verbal consent for treatment and assignment of benefits for services provided during this visit. Patient/Guardian expressed understanding and agreed to proceed.    Renee Clay, MD 08/07/2021, 1:44 PM

## 2021-08-07 ENCOUNTER — Encounter: Payer: Self-pay | Admitting: Psychiatry

## 2021-08-07 ENCOUNTER — Telehealth (INDEPENDENT_AMBULATORY_CARE_PROVIDER_SITE_OTHER): Payer: 59 | Admitting: Psychiatry

## 2021-08-07 DIAGNOSIS — G4700 Insomnia, unspecified: Secondary | ICD-10-CM

## 2021-08-07 DIAGNOSIS — F330 Major depressive disorder, recurrent, mild: Secondary | ICD-10-CM | POA: Diagnosis not present

## 2021-08-07 MED ORDER — TRAZODONE HCL 150 MG PO TABS: 150.0000 mg | ORAL_TABLET | Freq: Every evening | ORAL | 1 refills | Status: DC | PRN

## 2021-09-28 ENCOUNTER — Other Ambulatory Visit (HOSPITAL_COMMUNITY): Payer: Self-pay | Admitting: Family Medicine

## 2021-09-28 DIAGNOSIS — Z13820 Encounter for screening for osteoporosis: Secondary | ICD-10-CM

## 2021-09-28 DIAGNOSIS — M810 Age-related osteoporosis without current pathological fracture: Secondary | ICD-10-CM

## 2021-11-14 ENCOUNTER — Ambulatory Visit (HOSPITAL_COMMUNITY)
Admission: RE | Admit: 2021-11-14 | Discharge: 2021-11-14 | Disposition: A | Discharge status: Home / Self Care | Payer: Medicare HMO | Source: Ambulatory Visit | Attending: Family Medicine | Admitting: Family Medicine

## 2021-11-14 DIAGNOSIS — Z13820 Encounter for screening for osteoporosis: Secondary | ICD-10-CM | POA: Diagnosis not present

## 2021-11-14 DIAGNOSIS — M810 Age-related osteoporosis without current pathological fracture: Secondary | ICD-10-CM | POA: Diagnosis present

## 2021-12-04 NOTE — Progress Notes (Deleted)
BH MD/PA/NP OP Progress Note  12/04/2021 10:32 AM [name]  MRN:  [id number]  Chief Complaint: No chief complaint on file.  HPI: *** Visit Diagnosis: No diagnosis found.  Past Psychiatric History: Please see initial evaluation for full details. I have reviewed the history. No updates at this time.     Past Medical History:  Past Medical History:  Diagnosis Date   Anxiety    Depression    GERD (gastroesophageal reflux disease)    Hypertension    Meralgia paresthetica of right side 06/05/2017    Past Surgical History:  Procedure Laterality Date   KNEE CARTILAGE SURGERY Right    Workers Compensation, fell out of chair at work.     Family Psychiatric History: Please see initial evaluation for full details. I have reviewed the history. No updates at this time.     Family History:  Family History  Problem Relation Age of Onset   Alzheimer's disease Mother    Hypertension Sister    Diabetes Maternal Uncle    Hypertension Sister     Social History:  Social History   Socioeconomic History   Marital status: Married    Spouse name: Not on file   Number of children: Not on file   Years of education: Not on file   Highest education level: Not on file  Occupational History   Not on file  Tobacco Use   Smoking status: Former    Types: Cigarettes    Quit date: 10/13/1983    Years since quitting: 38.1   Smokeless tobacco: Never  Substance and Sexual Activity   Alcohol use: No   Drug use: No   Sexual activity: Not Currently    Birth control/protection: Post-menopausal  Other Topics Concern   Not on file  Social History Narrative   Lives   Caffeine use:    Married. 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    Spends every 3rd night at [employer].    Has been taking care of mother since 23.    Social Determinants of Health   Financial Resource Strain: Not on file  Food Insecurity: Not on file  Transportation Needs: Not on file  Physical  Activity: Not on file  Stress: Not on file  Social Connections: Not on file    Allergies: No Known Allergies  Metabolic Disorder Labs: Lab Results  Component Value Date   HGBA1C 5.5 11/06/2016   HGBA1C 5.5 11/06/2016   MPG 111 11/06/2016   No results found for: "PROLACTIN" Lab Results  Component Value Date   CHOL 149 10/30/2017   TRIG 73 10/30/2017   HDL 48 (L) 10/30/2017   CHOLHDL 3.1 10/30/2017   LDLCALC 85 10/30/2017   LDLCALC 189 (H) 07/10/2017   Lab Results  Component Value Date   TSH 0.98 10/30/2017   TSH 1.23 11/06/2016   TSH 1.23 11/06/2016    Therapeutic Level Labs: No results found for: "LITHIUM" No results found for: "VALPROATE" Lab Results  Component Value Date   CBMZ 7.8 07/29/2017    Current Medications: Current Outpatient Medications  Medication Sig Dispense Refill   aspirin EC 81 MG tablet Take 81 mg by mouth daily.     atorvastatin (LIPITOR) 20 MG tablet Take 1 tablet (20 mg total) by mouth daily. 90 tablet 3   Biotin 5 MG TABS Use one po qd  0   buPROPion (WELLBUTRIN XL) 150 MG 24 hr tablet Total of 450 mg daily, along with 300 mg tab  90 tablet 2   buPROPion (WELLBUTRIN XL) 300 MG 24 hr tablet Take total of 450 mg daily, along with 150 mg tab 90 tablet 2   chlorhexidine (PERIDEX) 0.12 % solution 5 mLs by Mouth Rinse route 2 (two) times daily.     Cholecalciferol 25 MCG (1000 UT) capsule Take 4,000 Units by mouth daily.     gabapentin (NEURONTIN) 300 MG capsule TAKE TWO (2) CAPSULES BY MOUTH THREE TIMES DAILY. 180 capsule 1   hydrochlorothiazide (HYDRODIURIL) 25 MG tablet Take 1 tablet (25 mg total) by mouth daily. 90 tablet 1   meloxicam (MOBIC) 15 MG tablet Take 1 tablet (15 mg total) by mouth daily. 30 tablet 3   metoprolol succinate (TOPROL-XL) 50 MG 24 hr tablet Take 1 tablet (50 mg total) by mouth daily. Take with or immediately following a meal. 90 tablet 0   Multiple Vitamin (MULTIVITAMIN) capsule Take 1 capsule by mouth daily.      traZODone (DESYREL) 150 MG tablet Take 1 tablet (150 mg total) by mouth at bedtime as needed for sleep. 90 tablet 1   venlafaxine XR (EFFEXOR-XR) 150 MG 24 hr capsule Take 1 capsule (150 mg total) by mouth daily. Total of 225 mg daily. Take along with 75 mg cap 90 capsule 1   venlafaxine XR (EFFEXOR-XR) 75 MG 24 hr capsule Take 1 capsule (75 mg total) by mouth daily. Total of 225 mg daily. Take along with 150 mg cap 90 capsule 1   No current facility-administered medications for this visit.     Musculoskeletal: Strength & Muscle Tone:  N/A Gait & Station:  N/A Patient leans: N/A  Psychiatric Specialty Exam: Review of Systems  There were no vitals taken for this visit.There is no height or weight on file to calculate BMI.  General Appearance: {Appearance:22683}  Eye Contact:  {BHH EYE CONTACT:22684}  Speech:  Clear and Coherent  Volume:  Normal  Mood:  {BHH MOOD:22306}  Affect:  {Affect (PAA):22687}  Thought Process:  Coherent  Orientation:  Full (Time, Place, and Person)  Thought Content: Logical   Suicidal Thoughts:  {ST/HT (PAA):22692}  Homicidal Thoughts:  {ST/HT (PAA):22692}  Memory:  Immediate;   Good  Judgement:  {Judgement (PAA):22694}  Insight:  {Insight (PAA):22695}  Psychomotor Activity:  Normal  Concentration:  Concentration: Good and Attention Span: Good  Recall:  Good  Fund of Knowledge: Good  Language: Good  Akathisia:  No  Handed:  Right  AIMS (if indicated): not done  Assets:  Communication Skills Desire for Improvement  ADL's:  Intact  Cognition: WNL  Sleep:  {BHH GOOD/FAIR/POOR:22877}   Screenings: GAD-7    Animal nutritionist BH Phone Follow Up from 10/29/2017 in [place] [facility] Phone Follow Up from 09/25/2017 in [place] [facility] Phone Follow Up from 05/28/2017 in [place]  Total GAD-7 Score '12 17 14      '$ PHQ2-9    [place] Office Visit from 05/17/2021 in [place] [facility] [place] Video Visit from 10/10/2020 in [place] Video Visit from 07/11/2020 in [place] Video Visit from 04/13/2020 in [place] Office Visit from 11/05/2018 in [facility] [facility]  PHQ-2 Total Score '2 1 1 2 2  '$ PHQ-9 Total Score 4 -- -- 2 5      Flowsheet Row Video Visit from 10/10/2020 in [place] Video Visit from 04/13/2020 in [place] Phone Follow  Up from 10/29/2017 in [facility]  C-SSRS RISK CATEGORY No Risk No Risk No Risk        Assessment and Plan:  Ambrie Carte is a 66 y.o. year old female with a history of depression,meralgia paresthetica, hyperlipidemia , who presents for follow up appointment for below.    1. MDD (major depressive disorder), recurrent episode, mild ([place]) Although she continues to report occasional depressed mood and anxiety, it has been improving overall since the last visit. Recent psychosocial stressors includes loss of her mother with Alzheimer disease.  Other psychosocial stressors includes taking care of her uncle with dialysis.  Will continue venlafaxine and bupropion to target depression. Although she will greatly benefit from CBT, she is not interested in seeing a therapist again as she felt it was "more work."   2. Insomnia, unspecified type She continues to report middle insomnia with occasional snoring.  She agrees for evaluation of sleep apnea.  Will continue trazodone as needed for insomnia.    Plan   Continue venlafaxine 225 mg daily (150 mg + 75 mg) Continue  bupropion 450 mg daily  Continue Trazodone 150 mg at night  Next appointment:  Referral for sleep evaluation - on gabapentin 600 mg three times a day - She was seen for annual visit in may 2022    Past trials of medication: sertraline, fluoxetine, venlafaxine,  xanax, clonazepam, temazepam, Ambien   The patient demonstrates the following risk factors for suicide: Chronic risk factors for suicide include: psychiatric disorder of depression. Acute risk factors for suicide include: N/A. Protective factors for this patient include: coping skills and hope for the future. Considering these factors, the overall suicide risk at this point appears to be low. Patient is appropriate for outpatient follow up.          Collaboration of Care: Collaboration of Care: {BH OP Collaboration of Care:21014065}  Patient/Guardian was advised Release of Information must be obtained prior to any record release in order to collaborate their care with an outside provider. Patient/Guardian was advised if they have not already done so to contact the registration department to sign all necessary forms in order for [place] to release information regarding their care.   Consent: Patient/Guardian gives verbal consent for treatment and assignment of benefits for services provided during this visit. Patient/Guardian expressed understanding and agreed to proceed.    [name], MD 12/04/2021, 10:32 AM

## 2021-12-05 ENCOUNTER — Telehealth: Payer: 59 | Admitting: Psychiatry

## 2021-12-05 NOTE — Progress Notes (Unsigned)
Virtual Visit via Telephone Note  I connected with [name] on 12/06/21 at  1:30 PM EDT by telephone and verified that I am speaking with the correct person using two identifiers.  Location: Patient: home Provider: office Persons participated in the visit- patient, provider    I discussed the limitations, risks, security and privacy concerns of performing an evaluation and management service by telephone and the availability of in person appointments. I also discussed with the patient that there may be a patient responsible charge related to this service. The patient expressed understanding and agreed to proceed.  I discussed the assessment and treatment plan with the patient. The patient was provided an opportunity to ask questions and all were answered. The patient agreed with the plan and demonstrated an understanding of the instructions.   The patient was advised to call back or seek an in-person evaluation if the symptoms worsen or if the condition fails to improve as anticipated.  I provided 14 minutes of non-face-to-face time during this encounter.   [name], MD    [facility] MD/PA/NP OP Progress Note  12/06/2021 1:58 PM [name]  MRN:  [id number]  Chief Complaint:  Chief Complaint  Patient presents with   Follow-up   Depression   HPI:  This is a follow-up appointment for depression and anxiety.  She states that she has been doing okay.  She continues to take care of her uncle.  She is looking forward to attend the wedding of her close friend.  She still feels weary of going outside, wear a mask.  She talks about an episode of her feeling upset when she did not know the location on the way to her dentist appointment.  She tries not to be upset.  She sleeps good on most days.  She denies change in appetite.  Although she feels down at times, it is not bothering.  She denies SI.  She denies alcohol use, drug use or cigarette use.    Daily routine: taking care of her  uncle who is on dialysis Employment: unemployed, used to work at [employer]: husband Marital status: married since 2008 Number of children:0   Visit Diagnosis:    ICD-10-CM   1. MDD (major depressive disorder), recurrent, in partial remission ([place])  F33.41     2. Insomnia, unspecified type  G47.00       Past Psychiatric History: Please see initial evaluation for full details. I have reviewed the history. No updates at this time.     Past Medical History:  Past Medical History:  Diagnosis Date   Anxiety    Depression    GERD (gastroesophageal reflux disease)    Hypertension    Meralgia paresthetica of right side 06/05/2017    Past Surgical History:  Procedure Laterality Date   KNEE CARTILAGE SURGERY Right    Workers Compensation, fell out of chair at work.     Family Psychiatric History: Please see initial evaluation for full details. I have reviewed the history. No updates at this time.     Family History:  Family History  Problem Relation Age of Onset   Alzheimer's disease Mother    Hypertension Sister    Diabetes Maternal Uncle    Hypertension Sister     Social History:  Social History   Socioeconomic History   Marital status: Married    Spouse name: Not on file   Number of children: Not on file   Years of education: Not on file  Highest education level: Not on file  Occupational History   Not on file  Tobacco Use   Smoking status: Former    Types: Cigarettes    Quit date: 10/13/1983    Years since quitting: 38.1   Smokeless tobacco: Never  Substance and Sexual Activity   Alcohol use: No   Drug use: No   Sexual activity: Not Currently    Birth control/protection: Post-menopausal  Other Topics Concern   Not on file  Social History Narrative   Lives   Caffeine use:    Married. 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    Spends every 3rd night at [facility].    Has been taking care of mother since 57.    Social  Determinants of Health   Financial Resource Strain: Not on file  Food Insecurity: Not on file  Transportation Needs: Not on file  Physical Activity: Not on file  Stress: Not on file  Social Connections: Not on file    Allergies: No Known Allergies  Metabolic Disorder Labs: Lab Results  Component Value Date   HGBA1C 5.5 11/06/2016   HGBA1C 5.5 11/06/2016   MPG 111 11/06/2016   No results found for: "PROLACTIN" Lab Results  Component Value Date   CHOL 149 10/30/2017   TRIG 73 10/30/2017   HDL 48 (L) 10/30/2017   CHOLHDL 3.1 10/30/2017   LDLCALC 85 10/30/2017   LDLCALC 189 (H) 07/10/2017   Lab Results  Component Value Date   TSH 0.98 10/30/2017   TSH 1.23 11/06/2016   TSH 1.23 11/06/2016    Therapeutic Level Labs: No results found for: "LITHIUM" No results found for: "VALPROATE" Lab Results  Component Value Date   CBMZ 7.8 07/29/2017    Current Medications: Current Outpatient Medications  Medication Sig Dispense Refill   aspirin EC 81 MG tablet Take 81 mg by mouth daily.     atorvastatin (LIPITOR) 20 MG tablet Take 1 tablet (20 mg total) by mouth daily. 90 tablet 3   Biotin 5 MG TABS Use one po qd  0   buPROPion (WELLBUTRIN XL) 150 MG 24 hr tablet Total of 450 mg daily, along with 300 mg tab 90 tablet 2   buPROPion (WELLBUTRIN XL) 300 MG 24 hr tablet Take total of 450 mg daily, along with 150 mg tab 90 tablet 2   chlorhexidine (PERIDEX) 0.12 % solution 5 mLs by Mouth Rinse route 2 (two) times daily.     Cholecalciferol 25 MCG (1000 UT) capsule Take 4,000 Units by mouth daily.     gabapentin (NEURONTIN) 300 MG capsule TAKE TWO (2) CAPSULES BY MOUTH THREE TIMES DAILY. 180 capsule 1   hydrochlorothiazide (HYDRODIURIL) 25 MG tablet Take 1 tablet (25 mg total) by mouth daily. 90 tablet 1   meloxicam (MOBIC) 15 MG tablet Take 1 tablet (15 mg total) by mouth daily. 30 tablet 3   metoprolol succinate (TOPROL-XL) 50 MG 24 hr tablet Take 1 tablet (50 mg total) by mouth  daily. Take with or immediately following a meal. 90 tablet 0   Multiple Vitamin (MULTIVITAMIN) capsule Take 1 capsule by mouth daily.     traZODone (DESYREL) 150 MG tablet Take 1 tablet (150 mg total) by mouth at bedtime as needed for sleep. 90 tablet 1   venlafaxine XR (EFFEXOR-XR) 150 MG 24 hr capsule Take 1 capsule (150 mg total) by mouth daily. Total of 225 mg daily. Take along with 75 mg cap 90 capsule 1   venlafaxine XR (  EFFEXOR-XR) 75 MG 24 hr capsule Take 1 capsule (75 mg total) by mouth daily. Total of 225 mg daily. Take along with 150 mg cap 90 capsule 1   No current facility-administered medications for this visit.     Musculoskeletal: Strength & Muscle Tone:  N/A Gait & Station:  N/A Patient leans: N/A  Psychiatric Specialty Exam: Review of Systems  Psychiatric/Behavioral:  Positive for dysphoric mood. Negative for agitation, behavioral problems, confusion, decreased concentration, hallucinations, self-injury, sleep disturbance and suicidal ideas. The patient is nervous/anxious. The patient is not hyperactive.   All other systems reviewed and are negative.   There were no vitals taken for this visit.There is no height or weight on file to calculate BMI.  General Appearance: NA  Eye Contact:  NA  Speech:  Clear and Coherent  Volume:  Normal  Mood:   ok  Affect:  NA  Thought Process:  Coherent  Orientation:  Full (Time, Place, and Person)  Thought Content: Logical   Suicidal Thoughts:  No  Homicidal Thoughts:  No  Memory:  Immediate;   Good  Judgement:  Good  Insight:  Fair  Psychomotor Activity:  Normal  Concentration:  Concentration: Good and Attention Span: Good  Recall:  Good  Fund of Knowledge: Good  Language: Good  Akathisia:  No  Handed:  Right  AIMS (if indicated): not done  Assets:  Communication Skills Desire for Improvement  ADL's:  Intact  Cognition: WNL  Sleep:  Good   Screenings: GAD-7    Animal nutritionist BH Phone Follow Up from  10/29/2017 in [place] [facility] Phone Follow Up from 09/25/2017 in [place] [facility] Phone Follow Up from 05/28/2017 in [facility]  Total GAD-7 Score '12 17 14      '$ PHQ2-9    [place] Office Visit from 05/17/2021 in [place] Video Visit from 10/10/2020 in [place] Video Visit from 07/11/2020 in [place] Video Visit from 04/13/2020 in [place] Office Visit from 11/05/2018 in [facility]-  PHQ-2 Total Score '2 1 1 2 2  '$ PHQ-9 Total Score 4 -- -- 2 5      Flowsheet Row Video Visit from 10/10/2020 in [place] Video Visit from 04/13/2020 in [place] Phone Follow Up from 10/29/2017 in [place] No Risk No Risk No Risk        Assessment and Plan:  [name] is a 67 y.o. year old female with a history of  depression,meralgia paresthetica, hyperlipidemia, who presents for follow up appointment for below.   2. MDD (major depressive disorder), recurrent, in partial remission ([place]) There has been overall improvement in depressive symptoms and anxiety since the last visit.  Psychosocial stressors includes taking care of her uncle with dialysis.  Other psychosocial stressors includes loss of her mother with Alzheimer in December 2022.  Will continue venlafaxine and bupropion to target depression. Although she will greatly benefit from CBT, she is not interested in seeing a therapist again as she felt it was "more work."  1. Insomnia, unspecified type Improving.  Referral was made previously for evaluation of sleep apnea due to occasional snoring.  Will continue trazodone as needed for insomnia.    Plan   Continue venlafaxine 225 mg daily (150 mg + 75 mg) Continue   bupropion 450 mg daily  Continue Trazodone 150  mg at night  Next appointment: 1/18 at 3 PM, in person Referred for sleep evaluation - on gabapentin 600 mg three times a day - She was seen for annual visit in summer in 2023. Will have follow up for hypertension   Past trials of medication: sertraline, fluoxetine, venlafaxine, xanax, clonazepam, temazepam, Ambien   The patient demonstrates the following risk factors for suicide: Chronic risk factors for suicide include: psychiatric disorder of depression. Acute risk factors for suicide include: N/A. Protective factors for this patient include: coping skills and hope for the future. Considering these factors, the overall suicide risk at this point appears to be low. Patient is appropriate for outpatient follow up.         Collaboration of Care: Collaboration of Care: Other reviewed notes in Epic  Patient/Guardian was advised Release of Information must be obtained prior to any record release in order to collaborate their care with an outside provider. Patient/Guardian was advised if they have not already done so to contact the registration department to sign all necessary forms in order for [place] to release information regarding their care.   Consent: Patient/Guardian gives verbal consent for treatment and assignment of benefits for services provided during this visit. Patient/Guardian expressed understanding and agreed to proceed.    [name], MD 12/06/2021, 1:58 PM

## 2021-12-06 ENCOUNTER — Encounter: Payer: Self-pay | Admitting: Psychiatry

## 2021-12-06 ENCOUNTER — Telehealth (INDEPENDENT_AMBULATORY_CARE_PROVIDER_SITE_OTHER): Payer: Medicare HMO | Admitting: Psychiatry

## 2021-12-06 DIAGNOSIS — G4700 Insomnia, unspecified: Secondary | ICD-10-CM | POA: Diagnosis not present

## 2021-12-06 DIAGNOSIS — F3341 Major depressive disorder, recurrent, in partial remission: Secondary | ICD-10-CM

## 2021-12-06 NOTE — Patient Instructions (Signed)
Continue venlafaxine 225 mg daily (150 mg + 75 mg) Continue  bupropion 450 mg daily  Continue Trazodone 150 mg at night  Next appointment: 1/18 at 3 PM

## 2022-03-05 NOTE — Progress Notes (Deleted)
BH MD/PA/NP OP Progress Note  03/05/2022 10:54 AM [name]  MRN:  [id number]  Chief Complaint: No chief complaint on file.  HPI: *** Visit Diagnosis: No diagnosis found.  Past Psychiatric History: Please see initial evaluation for full details. I have reviewed the history. No updates at this time.     Past Medical History:  Past Medical History:  Diagnosis Date   Anxiety    Depression    GERD (gastroesophageal reflux disease)    Hypertension    Meralgia paresthetica of right side 06/05/2017    Past Surgical History:  Procedure Laterality Date   KNEE CARTILAGE SURGERY Right    Workers Compensation, fell out of chair at work.     Family Psychiatric History: Please see initial evaluation for full details. I have reviewed the history. No updates at this time.     Family History:  Family History  Problem Relation Age of Onset   Alzheimer's disease Mother    Hypertension Sister    Diabetes Maternal Uncle    Hypertension Sister     Social History:  Social History   Socioeconomic History   Marital status: Married    Spouse name: Not on file   Number of children: Not on file   Years of education: Not on file   Highest education level: Not on file  Occupational History   Not on file  Tobacco Use   Smoking status: Former    Types: Cigarettes    Quit date: 10/13/1983    Years since quitting: 38.4   Smokeless tobacco: Never  Substance and Sexual Activity   Alcohol use: No   Drug use: No   Sexual activity: Not Currently    Birth control/protection: Post-menopausal  Other Topics Concern   Not on file  Social History Narrative   Lives   Caffeine use:    Married. 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    Spends every 3rd night at [employer].    Has been taking care of mother since 89.    Social Determinants of Health   Financial Resource Strain: Not on file  Food Insecurity: Not on file  Transportation Needs: Not on file  Physical Activity:  Not on file  Stress: Not on file  Social Connections: Not on file    Allergies: No Known Allergies  Metabolic Disorder Labs: Lab Results  Component Value Date   HGBA1C 5.5 11/06/2016   HGBA1C 5.5 11/06/2016   MPG 111 11/06/2016   No results found for: "PROLACTIN" Lab Results  Component Value Date   CHOL 149 10/30/2017   TRIG 73 10/30/2017   HDL 48 (L) 10/30/2017   CHOLHDL 3.1 10/30/2017   LDLCALC 85 10/30/2017   LDLCALC 189 (H) 07/10/2017   Lab Results  Component Value Date   TSH 0.98 10/30/2017   TSH 1.23 11/06/2016   TSH 1.23 11/06/2016    Therapeutic Level Labs: No results found for: "LITHIUM" No results found for: "VALPROATE" Lab Results  Component Value Date   CBMZ 7.8 07/29/2017    Current Medications: Current Outpatient Medications  Medication Sig Dispense Refill   aspirin EC 81 MG tablet Take 81 mg by mouth daily.     atorvastatin (LIPITOR) 20 MG tablet Take 1 tablet (20 mg total) by mouth daily. 90 tablet 3   Biotin 5 MG TABS Use one po qd  0   buPROPion (WELLBUTRIN XL) 150 MG 24 hr tablet Total of 450 mg daily, along with 300 mg tab  90 tablet 2   buPROPion (WELLBUTRIN XL) 300 MG 24 hr tablet Take total of 450 mg daily, along with 150 mg tab 90 tablet 2   chlorhexidine (PERIDEX) 0.12 % solution 5 mLs by Mouth Rinse route 2 (two) times daily.     Cholecalciferol 25 MCG (1000 UT) capsule Take 4,000 Units by mouth daily.     gabapentin (NEURONTIN) 300 MG capsule TAKE TWO (2) CAPSULES BY MOUTH THREE TIMES DAILY. 180 capsule 1   hydrochlorothiazide (HYDRODIURIL) 25 MG tablet Take 1 tablet (25 mg total) by mouth daily. 90 tablet 1   meloxicam (MOBIC) 15 MG tablet Take 1 tablet (15 mg total) by mouth daily. 30 tablet 3   metoprolol succinate (TOPROL-XL) 50 MG 24 hr tablet Take 1 tablet (50 mg total) by mouth daily. Take with or immediately following a meal. 90 tablet 0   Multiple Vitamin (MULTIVITAMIN) capsule Take 1 capsule by mouth daily.     traZODone  (DESYREL) 150 MG tablet Take 1 tablet (150 mg total) by mouth at bedtime as needed for sleep. 90 tablet 1   venlafaxine XR (EFFEXOR-XR) 150 MG 24 hr capsule Take 1 capsule (150 mg total) by mouth daily. Total of 225 mg daily. Take along with 75 mg cap 90 capsule 1   venlafaxine XR (EFFEXOR-XR) 75 MG 24 hr capsule Take 1 capsule (75 mg total) by mouth daily. Total of 225 mg daily. Take along with 150 mg cap 90 capsule 1   No current facility-administered medications for this visit.     Musculoskeletal: Strength & Muscle Tone: within normal limits Gait & Station: {PE GAIT ED QX:[id number] Patient leans: {Patient Leans:21022755}  Psychiatric Specialty Exam: Review of Systems  There were no vitals taken for this visit.There is no height or weight on file to calculate BMI.  General Appearance: {Appearance:22683}  Eye Contact:  {BHH EYE CONTACT:22684}  Speech:  Clear and Coherent  Volume:  Normal  Mood:  {BHH MOOD:22306}  Affect:  {Affect (PAA):22687}  Thought Process:  Coherent  Orientation:  Full (Time, Place, and Person)  Thought Content: Logical   Suicidal Thoughts:  {ST/HT (PAA):22692}  Homicidal Thoughts:  {ST/HT (PAA):22692}  Memory:  Immediate;   Good  Judgement:  {Judgement (PAA):22694}  Insight:  {Insight (PAA):22695}  Psychomotor Activity:  Normal  Concentration:  Concentration: Good and Attention Span: Good  Recall:  Good  Fund of Knowledge: Good  Language: Good  Akathisia:  No  Handed:  Right  AIMS (if indicated): not done  Assets:  Communication Skills Desire for Improvement  ADL's:  Intact  Cognition: WNL  Sleep:  {BHH GOOD/FAIR/POOR:22877}   Screenings: GAD-7    Animal nutritionist BH Phone Follow Up from 10/29/2017 in [place] [facility] Phone Follow Up from 09/25/2017 in [facility] Phone Follow Up from 05/28/2017 in [place]  Total GAD-7 Score 12 17 14      $ PHQ2-9    [place] Office  Visit from 05/17/2021 in [place] Video Visit from 10/10/2020 in [place] Video Visit from 07/11/2020 in [place] Video Visit from 04/13/2020 in [place] Office Visit from 11/05/2018 in [facility]  PHQ-2 Total Score 2 1 1 2 2  $ PHQ-9 Total Score 4 -- -- 2 5      Flowsheet Row Video Visit from 10/10/2020 in [place] Video Visit from 04/13/2020 in [place]  Virtual [place] Phone Follow Up from 10/29/2017 in [facility]  C-SSRS RISK CATEGORY No Risk No Risk No Risk        Assessment and Plan:  [name] is a 67 y.o. year old female with a history of depression,meralgia paresthetica, hyperlipidemia, who presents for follow up appointment for below.    2. MDD (major depressive disorder), recurrent, in partial remission ([place]) There has been overall improvement in depressive symptoms and anxiety since the last visit.  Psychosocial stressors includes taking care of her uncle with dialysis.  Other psychosocial stressors includes loss of her mother with Alzheimer in December 2022.  Will continue venlafaxine and bupropion to target depression. Although she will greatly benefit from CBT, she is not interested in seeing a therapist again as she felt it was "more work."   1. Insomnia, unspecified type Improving.  Referral was made previously for evaluation of sleep apnea due to occasional snoring.  Will continue trazodone as needed for insomnia.    Plan   Continue venlafaxine 225 mg daily (150 mg + 75 mg) Continue  bupropion 450 mg daily  Continue Trazodone 150 mg at night  Next appointment: 1/18 at 3 PM, in person Referred for sleep evaluation - on gabapentin 600 mg three times a day - She was seen for annual visit in summer in 2023. Will have follow up for  hypertension   Past trials of medication: sertraline, fluoxetine, venlafaxine, xanax, clonazepam, temazepam, Ambien   The patient demonstrates the following risk factors for suicide: Chronic risk factors for suicide include: psychiatric disorder of depression. Acute risk factors for suicide include: N/A. Protective factors for this patient include: coping skills and hope for the future. Considering these factors, the overall suicide risk at this point appears to be low. Patient is appropriate for outpatient follow up.      Collaboration of Care: Collaboration of Care: {BH OP Collaboration of Care:[id number]}  Patient/Guardian was advised Release of Information must be obtained prior to any record release in order to collaborate their care with an outside provider. Patient/Guardian was advised if they have not already done so to contact the registration department to sign all necessary forms in order for [place] to release information regarding their care.   Consent: Patient/Guardian gives verbal consent for treatment and assignment of benefits for services provided during this visit. Patient/Guardian expressed understanding and agreed to proceed.    [name], MD 03/05/2022, 10:54 AM

## 2022-03-07 ENCOUNTER — Ambulatory Visit: Payer: 59 | Admitting: Psychiatry

## 2022-03-11 NOTE — Progress Notes (Unsigned)
Virtual Visit via Video Note  I connected with [name] on 03/13/22 at 10:30 AM EST by a video enabled telemedicine application and verified that I am speaking with the correct person using two identifiers.  Location: Patient: home Provider: office Persons participated in the visit- patient, provider    I discussed the limitations of evaluation and management by telemedicine and the availability of in person appointments. The patient expressed understanding and agreed to proceed.      I discussed the assessment and treatment plan with the patient. The patient was provided an opportunity to ask questions and all were answered. The patient agreed with the plan and demonstrated an understanding of the instructions.   The patient was advised to call back or seek an in-person evaluation if the symptoms worsen or if the condition fails to improve as anticipated.  I provided 15 minutes of non-face-to-face time during this encounter.   [name], MD    [facility] MD/PA/NP OP Progress Note  03/13/2022 11:05 AM [name]  MRN:  [id number]  Chief Complaint:  Chief Complaint  Patient presents with   Follow-up   HPI:  This is a follow-up appointment for depression and insomnia.  She states that her older sister passed away.  All of her sisters are very close with each other.  She misses her.  She states that she lost her mother 2 years ago in December.  She feels that things are different.  She had a good Christmas with her sister's family.  She reports good support from her husband.  She now goes to her uncle's place every day.  Her uncle's health is declining.  She thinks her sleep is not as sound compared to before.  She thinks there are various reasons of her waking up early, and is not interested in sleep evaluation.  She has been doing stress eating.  She has not measured her weight so often.  She denies SI.  She feels comfortable to stay on the current medication regimen at this time.    Daily routine: taking care of her uncle who is on dialysis Employment: unemployed, used to work at [employer]: husband Marital status: married since 2008 Number of children:0   182 lbs Wt Readings from Last 3 Encounters:  05/17/21 179 lb 9.6 oz (81.5 kg)  08/26/19 182 lb (82.6 kg)  07/15/19 186 lb (84.4 kg)     Visit Diagnosis:    ICD-10-CM   1. Insomnia, unspecified type  G47.00 traZODone (DESYREL) 150 MG tablet    venlafaxine XR (EFFEXOR-XR) 150 MG 24 hr capsule    venlafaxine XR (EFFEXOR-XR) 75 MG 24 hr capsule    2. MDD (major depressive disorder), recurrent episode, mild (HCC)  F33.0 venlafaxine XR (EFFEXOR-XR) 150 MG 24 hr capsule    venlafaxine XR (EFFEXOR-XR) 75 MG 24 hr capsule      Past Psychiatric History: Please see initial evaluation for full details. I have reviewed the history. No updates at this time.     Past Medical History:  Past Medical History:  Diagnosis Date   Anxiety    Depression    GERD (gastroesophageal reflux disease)    Hypertension    Meralgia paresthetica of right side 06/05/2017    Past Surgical History:  Procedure Laterality Date   KNEE CARTILAGE SURGERY Right    Workers Compensation, fell out of chair at work.     Family Psychiatric History: Please see initial evaluation for full details. I have reviewed the history.  No updates at this time.     Family History:  Family History  Problem Relation Age of Onset   Alzheimer's disease Mother    Hypertension Sister    Diabetes Maternal Uncle    Hypertension Sister     Social History:  Social History   Socioeconomic History   Marital status: Married    Spouse name: Not on file   Number of children: Not on file   Years of education: Not on file   Highest education level: Not on file  Occupational History   Not on file  Tobacco Use   Smoking status: Former    Types: Cigarettes    Quit date: 10/13/1983    Years since quitting: 38.4   Smokeless tobacco: Never   Substance and Sexual Activity   Alcohol use: No   Drug use: No   Sexual activity: Not Currently    Birth control/protection: Post-menopausal  Other Topics Concern   Not on file  Social History Narrative   Lives   Caffeine use:    Married. 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    Spends every 3rd night at [employer].    Has been taking care of mother since 28.    Social Determinants of Health   Financial Resource Strain: Not on file  Food Insecurity: Not on file  Transportation Needs: Not on file  Physical Activity: Not on file  Stress: Not on file  Social Connections: Not on file    Allergies: No Known Allergies  Metabolic Disorder Labs: Lab Results  Component Value Date   HGBA1C 5.5 11/06/2016   HGBA1C 5.5 11/06/2016   MPG 111 11/06/2016   No results found for: "PROLACTIN" Lab Results  Component Value Date   CHOL 149 10/30/2017   TRIG 73 10/30/2017   HDL 48 (L) 10/30/2017   CHOLHDL 3.1 10/30/2017   LDLCALC 85 10/30/2017   LDLCALC 189 (H) 07/10/2017   Lab Results  Component Value Date   TSH 0.98 10/30/2017   TSH 1.23 11/06/2016   TSH 1.23 11/06/2016    Therapeutic Level Labs: No results found for: "LITHIUM" No results found for: "VALPROATE" Lab Results  Component Value Date   CBMZ 7.8 07/29/2017    Current Medications: Current Outpatient Medications  Medication Sig Dispense Refill   aspirin EC 81 MG tablet Take 81 mg by mouth daily.     atorvastatin (LIPITOR) 20 MG tablet Take 1 tablet (20 mg total) by mouth daily. 90 tablet 3   Biotin 5 MG TABS Use one po qd  0   buPROPion (WELLBUTRIN XL) 150 MG 24 hr tablet Take 1 tablet (150 mg total) by mouth daily. Total of 450 mg daily, along with 300 mg tab 90 tablet 1   buPROPion (WELLBUTRIN XL) 300 MG 24 hr tablet Take 1 tablet (300 mg total) by mouth daily. Take total of 450 mg daily, take along with 150 mg tab 90 tablet 1   chlorhexidine (PERIDEX) 0.12 % solution 5 mLs by Mouth Rinse  route 2 (two) times daily.     Cholecalciferol 25 MCG (1000 UT) capsule Take 4,000 Units by mouth daily.     gabapentin (NEURONTIN) 300 MG capsule TAKE TWO (2) CAPSULES BY MOUTH THREE TIMES DAILY. 180 capsule 1   hydrochlorothiazide (HYDRODIURIL) 25 MG tablet Take 1 tablet (25 mg total) by mouth daily. 90 tablet 1   meloxicam (MOBIC) 15 MG tablet Take 1 tablet (15 mg total) by mouth daily. 30 tablet 3  metoprolol succinate (TOPROL-XL) 50 MG 24 hr tablet Take 1 tablet (50 mg total) by mouth daily. Take with or immediately following a meal. 90 tablet 0   Multiple Vitamin (MULTIVITAMIN) capsule Take 1 capsule by mouth daily.     traZODone (DESYREL) 150 MG tablet Take 1 tablet (150 mg total) by mouth at bedtime as needed for sleep. 90 tablet 0   venlafaxine XR (EFFEXOR-XR) 150 MG 24 hr capsule Take 1 capsule (150 mg total) by mouth daily. Total of 225 mg daily. Take along with 75 mg cap 90 capsule 1   venlafaxine XR (EFFEXOR-XR) 75 MG 24 hr capsule Take 1 capsule (75 mg total) by mouth daily. Total of 225 mg daily. Take along with 150 mg cap 90 capsule 1   No current facility-administered medications for this visit.     Musculoskeletal: Strength & Muscle Tone:  N/A Gait & Station:  N/A Patient leans: N/A  Psychiatric Specialty Exam: Review of Systems  Psychiatric/Behavioral:  Positive for dysphoric mood and sleep disturbance. Negative for agitation, behavioral problems, confusion, decreased concentration, hallucinations, self-injury and suicidal ideas. The patient is not nervous/anxious and is not hyperactive.   All other systems reviewed and are negative.   There were no vitals taken for this visit.There is no height or weight on file to calculate BMI.  General Appearance: Fairly Groomed  Eye Contact:  Good  Speech:  Clear and Coherent  Volume:  Normal  Mood:  Depressed  Affect:  Appropriate and Congruent  Thought Process:  Coherent  Orientation:  Full (Time, Place, and Person)   Thought Content: Logical   Suicidal Thoughts:  No  Homicidal Thoughts:  No  Memory:  Immediate;   Good  Judgement:  Good  Insight:  Good  Psychomotor Activity:  Normal  Concentration:  Concentration: Good and Attention Span: Good  Recall:  Good  Fund of Knowledge: Good  Language: Good  Akathisia:  No  Handed:  Right  AIMS (if indicated): not done  Assets:  Communication Skills Desire for Improvement  ADL's:  Intact  Cognition: WNL  Sleep:  Poor   Screenings: GAD-7    Animal nutritionist BH Phone Follow Up from 10/29/2017 in [facility] Phone Follow Up from 09/25/2017 in [place] Phone Follow Up from 05/28/2017 in [place]  Total GAD-7 Score '12 17 14      '$ PHQ2-9    [place] Office Visit from 05/17/2021 in [place] Video Visit from 10/10/2020 in [place] Video Visit from 07/11/2020 in [place] Video Visit from 04/13/2020 in [place] Office Visit from 11/05/2018 in [place] at [facility] Total Score '2 1 1 2 2  '$ PHQ-9 Total Score 4 -- -- 2 5      Flowsheet Row Video Visit from 10/10/2020 in [place] Video Visit from 04/13/2020 in [place] [facility] Phone Follow Up from 10/29/2017 in [facility]  C-SSRS RISK CATEGORY No Risk No Risk No Risk        Assessment and Plan:  Kearstin Learn is a 67 y.o. year old female with a history of depression,meralgia paresthetica, hyperlipidemia, who presents for follow up appointment for below.    to ensureI  2. MDD (major depressive disorder), recurrent  episode, mild (HCC)  Acute stressors include:loss of her  sister in Dec 2023, being a caregiver of her uncle, who is on dialysis  Other stressors include: loss of her mother in Dec 2022   History:   She reports slight worsening in her mood symptoms due to the stressors as above.  Will continue current medication regimen given her mood symptoms are likely more situational. Will continue venlafaxine and bupropion to target depression.  Although she will greatly benefit from CBT, she is not interested in seeing a therapist again as she felt it was "more work."  # Insomnia Slight worsening.  Although she was recommended for evaluation of sleep apnea especially given she has occasional snoring, she is not interested in this.  Will continue trazodone as needed for insomnia.    Plan   Continue venlafaxine 225 mg daily (150 mg + 75 mg) Continue  bupropion 450 mg daily  Continue Trazodone 150 mg at night  Next appointment: 4/23 at 11:30, office - on gabapentin 600 mg three times a day   Past trials of medication: sertraline, fluoxetine, venlafaxine, xanax, clonazepam, temazepam, Ambien   The patient demonstrates the following risk factors for suicide: Chronic risk factors for suicide include: psychiatric disorder of depression. Acute risk factors for suicide include: N/A. Protective factors for this patient include: coping skills and hope for the future. Considering these factors, the overall suicide risk at this point appears to be low. Patient is appropriate for outpatient follow up.     Collaboration of Care: Collaboration of Care: Other reviewed notes in Epic  Patient/Guardian was advised Release of Information must be obtained prior to any record release in order to collaborate their care with an outside provider. Patient/Guardian was advised if they have not already done so to contact the registration department to sign all necessary forms in order for [place] to release information regarding their care.   Consent: Patient/Guardian gives verbal consent  for treatment and assignment of benefits for services provided during this visit. Patient/Guardian expressed understanding and agreed to proceed.    [name], MD 03/13/2022, 11:05 AM

## 2022-03-13 ENCOUNTER — Telehealth (INDEPENDENT_AMBULATORY_CARE_PROVIDER_SITE_OTHER): Payer: Medicare HMO | Admitting: Psychiatry

## 2022-03-13 ENCOUNTER — Encounter: Payer: Self-pay | Admitting: Psychiatry

## 2022-03-13 DIAGNOSIS — G4700 Insomnia, unspecified: Secondary | ICD-10-CM

## 2022-03-13 DIAGNOSIS — F330 Major depressive disorder, recurrent, mild: Secondary | ICD-10-CM

## 2022-03-13 MED ORDER — VENLAFAXINE HCL ER 75 MG PO CP24: 75.0000 mg | ORAL_CAPSULE | Freq: Every day | ORAL | 1 refills | Status: DC

## 2022-03-13 MED ORDER — VENLAFAXINE HCL ER 150 MG PO CP24: 150.0000 mg | ORAL_CAPSULE | Freq: Every day | ORAL | 1 refills | Status: DC

## 2022-03-13 MED ORDER — BUPROPION HCL ER (XL) 300 MG PO TB24: 300.0000 mg | ORAL_TABLET | Freq: Every day | ORAL | 1 refills | Status: DC

## 2022-03-13 MED ORDER — TRAZODONE HCL 150 MG PO TABS: 150.0000 mg | ORAL_TABLET | Freq: Every evening | ORAL | 0 refills | Status: DC | PRN

## 2022-03-13 MED ORDER — BUPROPION HCL ER (XL) 150 MG PO TB24: 150.0000 mg | ORAL_TABLET | Freq: Every day | ORAL | 1 refills | Status: DC

## 2022-03-13 NOTE — Patient Instructions (Signed)
Continue venlafaxine 225 mg daily (150 mg + 75 mg) Continue  bupropion 450 mg daily  Continue Trazodone 150 mg at night  Next appointment: 4/23 at 11:30

## 2022-06-11 ENCOUNTER — Ambulatory Visit: Payer: Medicare HMO | Admitting: Psychiatry

## 2022-06-11 ENCOUNTER — Telehealth: Payer: Self-pay

## 2022-06-11 ENCOUNTER — Other Ambulatory Visit: Payer: Self-pay | Admitting: Psychiatry

## 2022-06-11 DIAGNOSIS — G4700 Insomnia, unspecified: Secondary | ICD-10-CM

## 2022-06-11 MED ORDER — TRAZODONE HCL 150 MG PO TABS: 150.0000 mg | ORAL_TABLET | Freq: Every evening | ORAL | 0 refills | Status: DC | PRN

## 2022-06-11 NOTE — Telephone Encounter (Signed)
  received fax requesting a refill on the trazodone  pt was last seen on 1-24 next appt 6-17    traZODone (DESYREL) 150 MG tablet Medication Date: 03/13/2022 Department: [facility] Ordering/Authorizing: [name], MD   Order Providers  Prescribing Provider Encounter Provider  [name], MD [name], MD   Outpatient Medication Detail   Disp Refills Start End   traZODone (DESYREL) 150 MG tablet 90 tablet 0 03/13/2022 06/11/2022   Sig - Route: Take 1 tablet (150 mg total) by mouth at bedtime as needed for sleep. - Oral   Sent to pharmacy as: traZODone (DESYREL) 150 MG tablet   E-Prescribing Status: Receipt confirmed by pharmacy (03/13/2022 11:22 AM EST)

## 2022-06-11 NOTE — Telephone Encounter (Signed)
ordered

## 2022-06-12 NOTE — Telephone Encounter (Signed)
Pt.notified

## 2022-06-28 ENCOUNTER — Other Ambulatory Visit: Payer: Self-pay | Admitting: Psychiatry

## 2022-07-30 NOTE — Progress Notes (Signed)
BH MD/PA/NP OP Progress Note  08/05/2022 3:38 PM [name]  MRN:  [id number]  Chief Complaint:  Chief Complaint  Patient presents with   Follow-up   HPI:  This is a follow-up appointment for depression, insomnia.  She states that her uncle passed away.  He was in the facility, and had a palliative care.  There was service yesterday.  Although she knows he is at a good place, it has been still difficult.  Her husband and her sister have been very supportive.  She tends to be worried that what happens next.  She constantly feels anxious. She has been busy taking care of his house and others.  She wonders how she would spend time moving forward.  The patient has mood symptoms as in PHQ-9/GAD-7. She denies SI.  She is willing to try BuSpar at this time.   Daily routine: taking care of her uncle who is on dialysis Employment: unemployed, used to work at [employer]: husband Marital status: married since 2008 Number of children:0   Wt Readings from Last 3 Encounters:  08/05/22 185 lb 3.2 oz (84 kg)  05/17/21 179 lb 9.6 oz (81.5 kg)  08/26/19 182 lb (82.6 kg)    Visit Diagnosis:    ICD-10-CM   1. MDD (major depressive disorder), recurrent episode, mild (HCC)  F33.0 venlafaxine XR (EFFEXOR-XR) 150 MG 24 hr capsule    venlafaxine XR (EFFEXOR-XR) 75 MG 24 hr capsule    2. Insomnia, unspecified type  G47.00 traZODone (DESYREL) 150 MG tablet    venlafaxine XR (EFFEXOR-XR) 150 MG 24 hr capsule    venlafaxine XR (EFFEXOR-XR) 75 MG 24 hr capsule      Past Psychiatric History: Please see initial evaluation for full details. I have reviewed the history. No updates at this time.     Past Medical History:  Past Medical History:  Diagnosis Date   Anxiety    Depression    GERD (gastroesophageal reflux disease)    Hypertension    Meralgia paresthetica of right side 06/05/2017    Past Surgical History:  Procedure Laterality Date   KNEE CARTILAGE SURGERY Right    Workers  Compensation, fell out of chair at work.     Family Psychiatric History: Please see initial evaluation for full details. I have reviewed the history. No updates at this time.     Family History:  Family History  Problem Relation Age of Onset   Alzheimer's disease Mother    Hypertension Sister    Diabetes Maternal Uncle    Hypertension Sister     Social History:  Social History   Socioeconomic History   Marital status: Married    Spouse name: Not on file   Number of children: Not on file   Years of education: Not on file   Highest education level: Not on file  Occupational History   Not on file  Tobacco Use   Smoking status: Former    Types: Cigarettes    Quit date: 10/13/1983    Years since quitting: 38.8   Smokeless tobacco: Never  Substance and Sexual Activity   Alcohol use: No   Drug use: No   Sexual activity: Not Currently    Birth control/protection: Post-menopausal  Other Topics Concern   Not on file  Social History Narrative   Lives   Caffeine use:    Married. 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    Spends every 3rd night at [employer].  Has been taking care of mother since 29.    Social Determinants of Health   Financial Resource Strain: Not on file  Food Insecurity: Not on file  Transportation Needs: Not on file  Physical Activity: Not on file  Stress: Not on file  Social Connections: Not on file    Allergies: No Known Allergies  Metabolic Disorder Labs: Lab Results  Component Value Date   HGBA1C 5.5 11/06/2016   HGBA1C 5.5 11/06/2016   MPG 111 11/06/2016   No results found for: "PROLACTIN" Lab Results  Component Value Date   CHOL 149 10/30/2017   TRIG 73 10/30/2017   HDL 48 (L) 10/30/2017   CHOLHDL 3.1 10/30/2017   LDLCALC 85 10/30/2017   LDLCALC 189 (H) 07/10/2017   Lab Results  Component Value Date   TSH 0.98 10/30/2017   TSH 1.23 11/06/2016   TSH 1.23 11/06/2016    Therapeutic Level Labs: No results  found for: "LITHIUM" No results found for: "VALPROATE" Lab Results  Component Value Date   CBMZ 7.8 07/29/2017    Current Medications: Current Outpatient Medications  Medication Sig Dispense Refill   aspirin EC 81 MG tablet Take 81 mg by mouth daily.     atorvastatin (LIPITOR) 20 MG tablet Take 1 tablet (20 mg total) by mouth daily. 90 tablet 3   Biotin 5 MG TABS Use one po qd  0   busPIRone (BUSPAR) 5 MG tablet Take 1 tablet (5 mg total) by mouth 2 (two) times daily. 60 tablet 1   chlorhexidine (PERIDEX) 0.12 % solution 5 mLs by Mouth Rinse route 2 (two) times daily.     Cholecalciferol 25 MCG (1000 UT) capsule Take 4,000 Units by mouth daily.     gabapentin (NEURONTIN) 300 MG capsule TAKE TWO (2) CAPSULES BY MOUTH THREE TIMES DAILY. 180 capsule 1   hydrochlorothiazide (HYDRODIURIL) 25 MG tablet Take 1 tablet (25 mg total) by mouth daily. 90 tablet 1   meloxicam (MOBIC) 15 MG tablet Take 1 tablet (15 mg total) by mouth daily. 30 tablet 3   metoprolol succinate (TOPROL-XL) 50 MG 24 hr tablet Take 1 tablet (50 mg total) by mouth daily. Take with or immediately following a meal. 90 tablet 0   Multiple Vitamin (MULTIVITAMIN) capsule Take 1 capsule by mouth daily.     [START ON 09/09/2022] buPROPion (WELLBUTRIN XL) 150 MG 24 hr tablet Take 1 tablet (150 mg total) by mouth daily. Total of 450 mg daily, along with 300 mg tab 90 tablet 1   [START ON 09/09/2022] buPROPion (WELLBUTRIN XL) 300 MG 24 hr tablet Take 1 tablet (300 mg total) by mouth daily. Take total of 450 mg daily, take along with 150 mg tab 90 tablet 1   [START ON 09/09/2022] traZODone (DESYREL) 150 MG tablet Take 1 tablet (150 mg total) by mouth at bedtime as needed for sleep. 90 tablet 0   [START ON 09/09/2022] venlafaxine XR (EFFEXOR-XR) 150 MG 24 hr capsule Take 1 capsule (150 mg total) by mouth daily. Total of 225 mg daily. Take along with 75 mg cap 90 capsule 1   [START ON 09/09/2022] venlafaxine XR (EFFEXOR-XR) 75 MG 24 hr capsule  Take 1 capsule (75 mg total) by mouth daily. Total of 225 mg daily. Take along with 150 mg cap 90 capsule 1   No current facility-administered medications for this visit.     Musculoskeletal: Strength & Muscle Tone: within normal limits Gait & Station: normal Patient leans: N/A  Psychiatric Specialty Exam:  Review of Systems  Psychiatric/Behavioral:  Positive for decreased concentration and dysphoric mood. Negative for agitation, behavioral problems, confusion, hallucinations, self-injury, sleep disturbance and suicidal ideas. The patient is nervous/anxious. The patient is not hyperactive.   All other systems reviewed and are negative.   Blood pressure (!) 167/97, pulse 62, temperature 97.8 F (36.6 C), temperature source Skin, height 5\' 1"  (1.549 m), weight 185 lb 3.2 oz (84 kg).Body mass index is 34.99 kg/m.  General Appearance: Fairly Groomed  Eye Contact:  Good  Speech:  Clear and Coherent  Volume:  Normal  Mood:  Anxious and Depressed  Affect:  Appropriate, Congruent, and Tearful  Thought Process:  Coherent  Orientation:  Full (Time, Place, and Person)  Thought Content: Logical   Suicidal Thoughts:  No  Homicidal Thoughts:  No  Memory:  Immediate;   Good  Judgement:  Good  Insight:  Good  Psychomotor Activity:  Normal  Concentration:  Concentration: Good and Attention Span: Good  Recall:  Good  Fund of Knowledge: Good  Language: Good  Akathisia:  No  Handed:  Right  AIMS (if indicated): not done  Assets:  Communication Skills Desire for Improvement  ADL's:  Intact  Cognition: WNL  Sleep:  Fair   Screenings: GAD-7    Naval architect BH Phone Follow Up from 10/29/2017 in [facility] Phone Follow Up from 09/25/2017 in [facility] Phone Follow Up from 05/28/2017 in [place] [facility]  Total GAD-7 Score 12 17 14       PHQ2-9    Flowsheet Row Office Visit from  08/05/2022 in [facility] Office Visit from 05/17/2021 in [facility] Video Visit from 10/10/2020 in [facility] Video Visit from 07/11/2020 in [facility] Video Visit from 04/13/2020 in [facility]  PHQ-2 Total Score 2 2 1 1 2   PHQ-9 Total Score 6 4 -- -- 2      Flowsheet Row Video Visit from 10/10/2020 in [facility] Video Visit from 04/13/2020 in [facility] Virtual [facility] Phone Follow Up from 10/29/2017 in [facility]  C-SSRS RISK CATEGORY No Risk No Risk No Risk        Assessment and Plan:  Shivanshi Chaput is a 67 y.o. year old female with a history of depression,meralgia paresthetica, hyperlipidemia, who presents for follow up appointment for below.   1. MDD (major depressive disorder), recurrent episode, mild (HCC) Acute stressors include:loss of her sister in Dec 2023, uncle June 2024 Other stressors include: loss of her mother in Dec 2022   History: struggling with depression for many years   She reports worsening in anxiety in the context of stressors as above.  We added BuSpar to target anxiety.  Will continue bupropion, and venlafaxine to target depression.   2. Insomnia, unspecified type - not interested in sleep evaluation despite snoring Overall improving.  Will continue trazodone as needed for insomnia.    Plan Continue venlafaxine 225 mg daily (150 mg + 75 mg) Continue  bupropion 450 mg daily  Continue Trazodone 150 mg at night  Next appointment: 8/15 at 3 pm, IP - on gabapentin 600 mg three times a day   Past trials of medication: sertraline, fluoxetine, venlafaxine, xanax, clonazepam, temazepam, Ambien   The patient demonstrates the following  risk factors for suicide:  Chronic risk factors for suicide include: psychiatric disorder of depression. Acute risk factors for suicide include: N/A. Protective factors for this patient include: coping skills and hope for the future. Considering these factors, the overall suicide risk at this point appears to be low. Patient is appropriate for outpatient follow up.       Collaboration of Care: Collaboration of Care: Other reviewed notes in Epic  Patient/Guardian was advised Release of Information must be obtained prior to any record release in order to collaborate their care with an outside provider. Patient/Guardian was advised if they have not already done so to contact the registration department to sign all necessary forms in order for [place] to release information regarding their care.   Consent: Patient/Guardian gives verbal consent for treatment and assignment of benefits for services provided during this visit. Patient/Guardian expressed understanding and agreed to proceed.    [name], MD 08/05/2022, 3:38 PM

## 2022-08-05 ENCOUNTER — Ambulatory Visit: Payer: Medicare HMO | Admitting: Psychiatry

## 2022-08-05 ENCOUNTER — Encounter: Payer: Self-pay | Admitting: Psychiatry

## 2022-08-05 VITALS — BP 167/97 | HR 62 | Temp 97.8°F | Ht 61.0 in | Wt 185.2 lb

## 2022-08-05 DIAGNOSIS — G4700 Insomnia, unspecified: Secondary | ICD-10-CM

## 2022-08-05 DIAGNOSIS — F330 Major depressive disorder, recurrent, mild: Secondary | ICD-10-CM

## 2022-08-05 MED ORDER — VENLAFAXINE HCL ER 150 MG PO CP24: 150.0000 mg | ORAL_CAPSULE | Freq: Every day | ORAL | 1 refills | Status: DC

## 2022-08-05 MED ORDER — VENLAFAXINE HCL ER 75 MG PO CP24: 75.0000 mg | ORAL_CAPSULE | Freq: Every day | ORAL | 1 refills | Status: DC

## 2022-08-05 MED ORDER — TRAZODONE HCL 150 MG PO TABS: 150.0000 mg | ORAL_TABLET | Freq: Every evening | ORAL | 0 refills | Status: DC | PRN

## 2022-08-05 MED ORDER — BUPROPION HCL ER (XL) 150 MG PO TB24: 150.0000 mg | ORAL_TABLET | Freq: Every day | ORAL | 1 refills | Status: DC

## 2022-08-05 MED ORDER — BUSPIRONE HCL 5 MG PO TABS: 5.0000 mg | ORAL_TABLET | Freq: Two times a day (BID) | ORAL | 1 refills | Status: AC

## 2022-08-05 MED ORDER — BUPROPION HCL ER (XL) 300 MG PO TB24: 300.0000 mg | ORAL_TABLET | Freq: Every day | ORAL | 1 refills | Status: DC

## 2022-08-08 ENCOUNTER — Telehealth: Payer: Self-pay

## 2022-08-08 NOTE — Telephone Encounter (Signed)
pt called left a message that you was suppose to have sent her a new rx to the pharmacy and they have not received anything yet. please send to the [facility] pt seen on 06-17 next appt 08-15

## 2022-08-08 NOTE — Telephone Encounter (Signed)
Medication was ordered a few days ago as below. Please verity with the pharmacy.    Disp Refills Start End   busPIRone (BUSPAR) 5 MG tablet 60 tablet 1 08/05/2022 10/04/2022  Sig - Route: Take 1 tablet (5 mg total) by mouth 2 (two) times daily. - Oral  Sent to pharmacy as: busPIRone (BUSPAR) 5 MG tablet  E-Prescribing Status: Receipt confirmed by pharmacy (08/05/2022  3:31 PM EDT)

## 2022-08-08 NOTE — Telephone Encounter (Signed)
Pt.notified

## 2022-08-08 NOTE — Telephone Encounter (Signed)
called pharmacy they have the rx but they have not processed it yet. they will have ready around 2 -2:30 .

## 2022-09-15 ENCOUNTER — Other Ambulatory Visit: Payer: Self-pay | Admitting: Psychiatry

## 2022-09-15 DIAGNOSIS — G4700 Insomnia, unspecified: Secondary | ICD-10-CM

## 2022-09-16 ENCOUNTER — Other Ambulatory Visit (HOSPITAL_COMMUNITY): Payer: Self-pay | Admitting: Family Medicine

## 2022-09-16 DIAGNOSIS — Z1231 Encounter for screening mammogram for malignant neoplasm of breast: Secondary | ICD-10-CM

## 2022-09-26 ENCOUNTER — Ambulatory Visit (HOSPITAL_COMMUNITY): Payer: Medicare HMO

## 2022-10-02 ENCOUNTER — Ambulatory Visit (HOSPITAL_COMMUNITY)
Admission: RE | Admit: 2022-10-02 | Discharge: 2022-10-02 | Disposition: A | Discharge status: Home / Self Care | Payer: Medicare HMO | Source: Ambulatory Visit | Attending: Family Medicine | Admitting: Family Medicine

## 2022-10-02 ENCOUNTER — Encounter (HOSPITAL_COMMUNITY): Payer: Self-pay

## 2022-10-02 DIAGNOSIS — Z1231 Encounter for screening mammogram for malignant neoplasm of breast: Secondary | ICD-10-CM | POA: Insufficient documentation

## 2022-10-03 ENCOUNTER — Ambulatory Visit: Payer: Medicare HMO | Admitting: Psychiatry

## 2022-10-13 NOTE — Progress Notes (Unsigned)
Virtual Visit via Video Note  I connected with [name] on 10/15/22 at  3:00 PM EDT by a video enabled telemedicine application and verified that I am speaking with the correct person using two identifiers.  Location: Patient: home Provider: office Persons participated in the visit- patient, provider    I discussed the limitations of evaluation and management by telemedicine and the availability of in person appointments. The patient expressed understanding and agreed to proceed.    I discussed the assessment and treatment plan with the patient. The patient was provided an opportunity to ask questions and all were answered. The patient agreed with the plan and demonstrated an understanding of the instructions.   The patient was advised to call back or seek an in-person evaluation if the symptoms worsen or if the condition fails to improve as anticipated.  I provided 20 minutes of non-face-to-face time during this encounter.   [name], MD   [facility] MD/PA/NP OP Progress Note  10/15/2022 3:33 PM [name]  MRN:  [id number]  Chief Complaint:  Chief Complaint  Patient presents with   Follow-up   HPI:  This is a follow-up appointment for depression, anxiety.  She states that they have decided to do a garage sell for her sister's home.  She feels good about this.  She enjoys gardening, and raise variety of flowers.  She is hoping to go to the beach with her husband.  She believes anxiety is much better since being on BuSpar.  She discontinued it 2 weeks ago as she thought she has been doing better.  She continues to struggle with motivation, although it has been better compared to before.  She feels down about her appearance and is concerned about the body weight.  She tries to eat healthy, although she may eat snack at times.  She sleeps better.  She denies SI.  She reports concern about decreasing libido for the past few years.  She agrees with the plan as below.  181 lbs Wt  Readings from Last 3 Encounters:  08/05/22 185 lb 3.2 oz (84 kg)  05/17/21 179 lb 9.6 oz (81.5 kg)  08/26/19 182 lb (82.6 kg)     Employment: unemployed, used to work at [employer]: husband Marital status: married since 2008 Number of children:0   Visit Diagnosis:    ICD-10-CM   1. MDD (major depressive disorder), recurrent episode, mild (HCC)  F33.0     2. Insomnia, unspecified type  G47.00       Past Psychiatric History: Please see initial evaluation for full details. I have reviewed the history. No updates at this time.     Past Medical History:  Past Medical History:  Diagnosis Date   Anxiety    Depression    GERD (gastroesophageal reflux disease)    Hypertension    Meralgia paresthetica of right side 06/05/2017    Past Surgical History:  Procedure Laterality Date   KNEE CARTILAGE SURGERY Right    Workers Compensation, fell out of chair at work.     Family Psychiatric History: Please see initial evaluation for full details. I have reviewed the history. No updates at this time.     Family History:  Family History  Problem Relation Age of Onset   Alzheimer's disease Mother    Hypertension Sister    Diabetes Maternal Uncle    Hypertension Sister     Social History:  Social History   Socioeconomic History   Marital status: Married  Spouse name: Not on file   Number of children: Not on file   Years of education: Not on file   Highest education level: Not on file  Occupational History   Not on file  Tobacco Use   Smoking status: Former    Current packs/day: 0.00    Types: Cigarettes    Quit date: 10/13/1983    Years since quitting: 39.0   Smokeless tobacco: Never  Substance and Sexual Activity   Alcohol use: No   Drug use: No   Sexual activity: Not Currently    Birth control/protection: Post-menopausal  Other Topics Concern   Not on file  Social History Narrative   Lives   Caffeine use:    Married. Takes care of mother who has  Alzheimer's Disease.    Spends every 3rd night at [employer].    Has been taking care of mother since 44.    Social Determinants of Health   Financial Resource Strain: Not on file  Food Insecurity: Not on file  Transportation Needs: Not on file  Physical Activity: Not on file  Stress: Not on file  Social Connections: Not on file    Allergies: No Known Allergies  Metabolic Disorder Labs: Lab Results  Component Value Date   HGBA1C 5.5 11/06/2016   HGBA1C 5.5 11/06/2016   MPG 111 11/06/2016   No results found for: "PROLACTIN" Lab Results  Component Value Date   CHOL 149 10/30/2017   TRIG 73 10/30/2017   HDL 48 (L) 10/30/2017   CHOLHDL 3.1 10/30/2017   LDLCALC 85 10/30/2017   LDLCALC 189 (H) 07/10/2017   Lab Results  Component Value Date   TSH 0.98 10/30/2017   TSH 1.23 11/06/2016   TSH 1.23 11/06/2016    Therapeutic Level Labs: No results found for: "LITHIUM" No results found for: "VALPROATE" Lab Results  Component Value Date   CBMZ 7.8 07/29/2017    Current Medications: Current Outpatient Medications  Medication Sig Dispense Refill   aspirin EC 81 MG tablet Take 81 mg by mouth daily.     atorvastatin (LIPITOR) 20 MG tablet Take 1 tablet (20 mg total) by mouth daily. 90 tablet 3   Biotin 5 MG TABS Use one po qd  0   buPROPion (WELLBUTRIN XL) 150 MG 24 hr tablet Take 1 tablet (150 mg total) by mouth daily. Total of 450 mg daily, along with 300 mg tab 90 tablet 1   buPROPion (WELLBUTRIN XL) 300 MG 24 hr tablet Take 1 tablet (300 mg total) by mouth daily. Take total of 450 mg daily, take along with 150 mg tab 90 tablet 1   chlorhexidine (PERIDEX) 0.12 % solution 5 mLs by Mouth Rinse route 2 (two) times daily.     Cholecalciferol 25 MCG (1000 UT) capsule Take 4,000 Units by mouth daily.     gabapentin (NEURONTIN) 300 MG capsule TAKE TWO (2) CAPSULES BY MOUTH THREE TIMES DAILY. 180 capsule 1   hydrochlorothiazide (HYDRODIURIL) 25 MG tablet Take 1 tablet (25 mg  total) by mouth daily. 90 tablet 1   meloxicam (MOBIC) 15 MG tablet Take 1 tablet (15 mg total) by mouth daily. 30 tablet 3   metoprolol succinate (TOPROL-XL) 50 MG 24 hr tablet Take 1 tablet (50 mg total) by mouth daily. Take with or immediately following a meal. 90 tablet 0   Multiple Vitamin (MULTIVITAMIN) capsule Take 1 capsule by mouth daily.     traZODone (DESYREL) 150 MG tablet Take 1 tablet (150 mg total)  by mouth at bedtime as needed for sleep. 90 tablet 0   venlafaxine XR (EFFEXOR-XR) 150 MG 24 hr capsule Take 1 capsule (150 mg total) by mouth daily. Total of 225 mg daily. Take along with 75 mg cap 90 capsule 1   venlafaxine XR (EFFEXOR-XR) 75 MG 24 hr capsule Take 1 capsule (75 mg total) by mouth daily. Total of 225 mg daily. Take along with 150 mg cap 90 capsule 1   No current facility-administered medications for this visit.     Musculoskeletal: Strength & Muscle Tone:  N/A Gait & Station:  N/A Patient leans: N/A  Psychiatric Specialty Exam: Review of Systems  Psychiatric/Behavioral:  Positive for dysphoric mood. Negative for agitation, behavioral problems, confusion, decreased concentration, hallucinations, self-injury, sleep disturbance and suicidal ideas. The patient is nervous/anxious. The patient is not hyperactive.   All other systems reviewed and are negative.   There were no vitals taken for this visit.There is no height or weight on file to calculate BMI.  General Appearance: Fairly Groomed  Eye Contact:  Good  Speech:  Clear and Coherent  Volume:  Normal  Mood:   better  Affect:  Appropriate, Congruent, and slightly brighter, calmer  Thought Process:  Coherent  Orientation:  Full (Time, Place, and Person)  Thought Content: Logical   Suicidal Thoughts:  No  Homicidal Thoughts:  No  Memory:  Immediate;   Good  Judgement:  Good  Insight:  Good  Psychomotor Activity:  Normal  Concentration:  Concentration: Good and Attention Span: Good  Recall:  Good  Fund  of Knowledge: Good  Language: Good  Akathisia:  No  Handed:  Right  AIMS (if indicated): not done  Assets:  Communication Skills Desire for Improvement  ADL's:  Intact  Cognition: WNL  Sleep:  Good   Screenings: GAD-7    Flowsheet Row Virtual BH Phone Follow Up from 10/29/2017 in [facility] Virtual [facility] Phone Follow Up from 09/25/2017 in [facility] Virtual [facility] Phone Follow Up from 05/28/2017 in [facility]  Total GAD-7 Score 12 17 14       PHQ2-9    Flowsheet Row Office Visit from 08/05/2022 in [facility] Office Visit from 05/17/2021 in [facility] Video Visit from 10/10/2020 in [facility] Video Visit from 07/11/2020 in [facility] Video Visit from 04/13/2020 in [facility]  PHQ-2 Total Score 2 2 1 1 2   PHQ-9 Total Score 6 4 -- -- 2      Flowsheet Row Video Visit from 10/10/2020 in [facility] Video Visit from 04/13/2020 in [facility] Virtual [facility] Phone Follow Up from 10/29/2017 in [facility]  C-SSRS RISK CATEGORY No Risk No Risk No Risk        Assessment and Plan:  [name] is a 67 y.o. year old female with a history of depression,meralgia paresthetica, hyperlipidemia, who presents for follow up appointment for below.   1. MDD (major depressive disorder), recurrent episode, mild (HCC) Acute stressors include:loss of her sister in Dec 2023, uncle June 2024 Other stressors include: loss of her mother in Dec 2022   History: struggling with depression for many years   There has been overall improvement in depressive symptoms and anxiety since starting BuSpar.  She self discontinued it 2 weeks  prior  due to improvement in her mood.  Also discussed potential risk of relapsing her mood symptoms, she prefers to hold it at this time, while working on behavioral activation.  Will continue venlafaxine and bupropion to target depression.  Noted that she reports decrease in libido for the past few years.  It is difficult to discern whether this is attributable to depression or medication side effects.  She agrees to revisit this issues as her mood improves.   2. Insomnia, unspecified type - not interested in sleep evaluation despite snoring Overall improving.  Will continue current dose of trazodone as needed for insomnia.    Plan Continue venlafaxine 225 mg daily (150 mg + 75 mg) Hold Buspar  Continue  bupropion 450 mg daily  Continue Trazodone 150 mg at night  Next appointment: 10/23 at 9 30, video - on gabapentin 600 mg three times a day   Past trials of medication: sertraline, fluoxetine, venlafaxine, xanax, clonazepam, temazepam, Ambien   The patient demonstrates the following risk factors for suicide: Chronic risk factors for suicide include: psychiatric disorder of depression. Acute risk factors for suicide include: N/A. Protective factors for this patient include: coping skills and hope for the future. Considering these factors, the overall suicide risk at this point appears to be low. Patient is appropriate for outpatient follow up.        Collaboration of Care: Collaboration of Care: Other reviewed notes in Epic  Patient/Guardian was advised Release of Information must be obtained prior to any record release in order to collaborate their care with an outside provider. Patient/Guardian was advised if they have not already done so to contact the registration department to sign all necessary forms in order for [place] to release information regarding their care.   Consent: Patient/Guardian gives verbal consent for treatment and assignment of benefits for services provided during this visit.  Patient/Guardian expressed understanding and agreed to proceed.    [name], MD 10/15/2022, 3:33 PM

## 2022-10-15 ENCOUNTER — Telehealth (INDEPENDENT_AMBULATORY_CARE_PROVIDER_SITE_OTHER): Payer: Medicare HMO | Admitting: Psychiatry

## 2022-10-15 ENCOUNTER — Encounter: Payer: Self-pay | Admitting: Psychiatry

## 2022-10-15 DIAGNOSIS — G4700 Insomnia, unspecified: Secondary | ICD-10-CM

## 2022-10-15 DIAGNOSIS — F330 Major depressive disorder, recurrent, mild: Secondary | ICD-10-CM

## 2022-11-23 IMAGING — MG MM DIGITAL SCREENING BILAT W/ TOMO AND CAD
6 of 12 series · 6 of 36 positions shown · non-contrast
Comparison: Previous exam(s).

CLINICAL DATA: Screening.

EXAM:
DIGITAL SCREENING BILATERAL MAMMOGRAM WITH TOMOSYNTHESIS AND CAD
TECHNIQUE: Bilateral screening digital craniocaudal and mediolateral oblique
mammograms were obtained. Bilateral screening digital breast
tomosynthesis was performed. The images were evaluated with
computer-aided detection.

[R MLO synth-2D (1 of 2)]
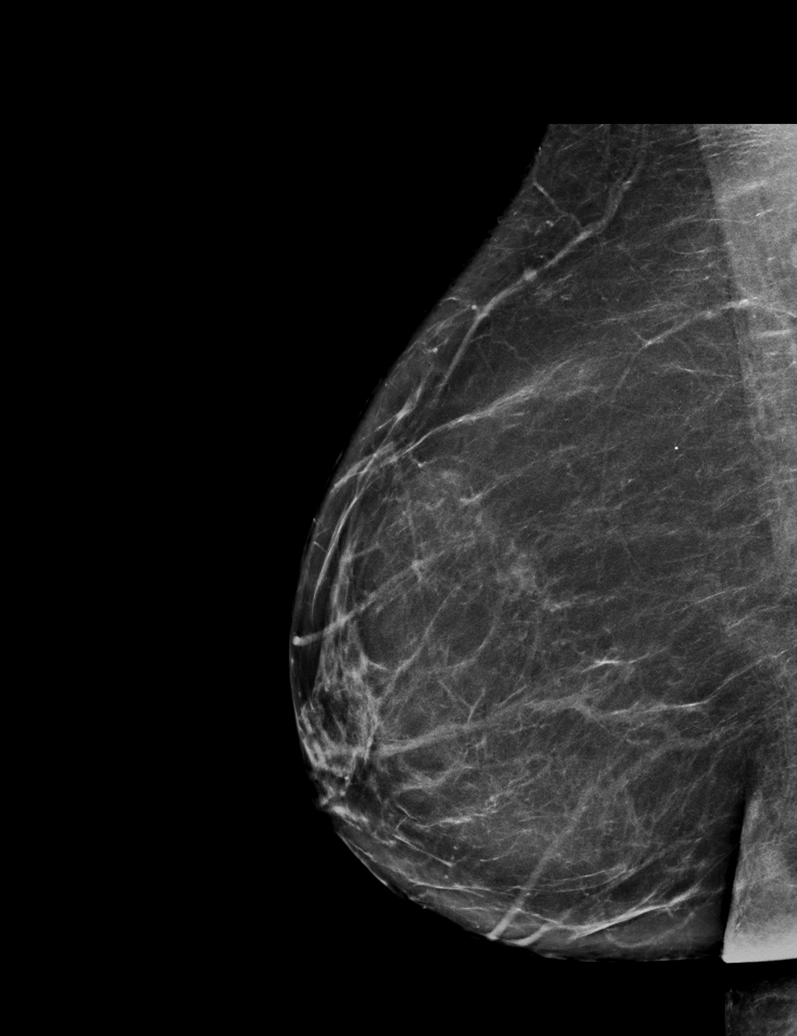

[L MLO synth-2D (1 of 2)]
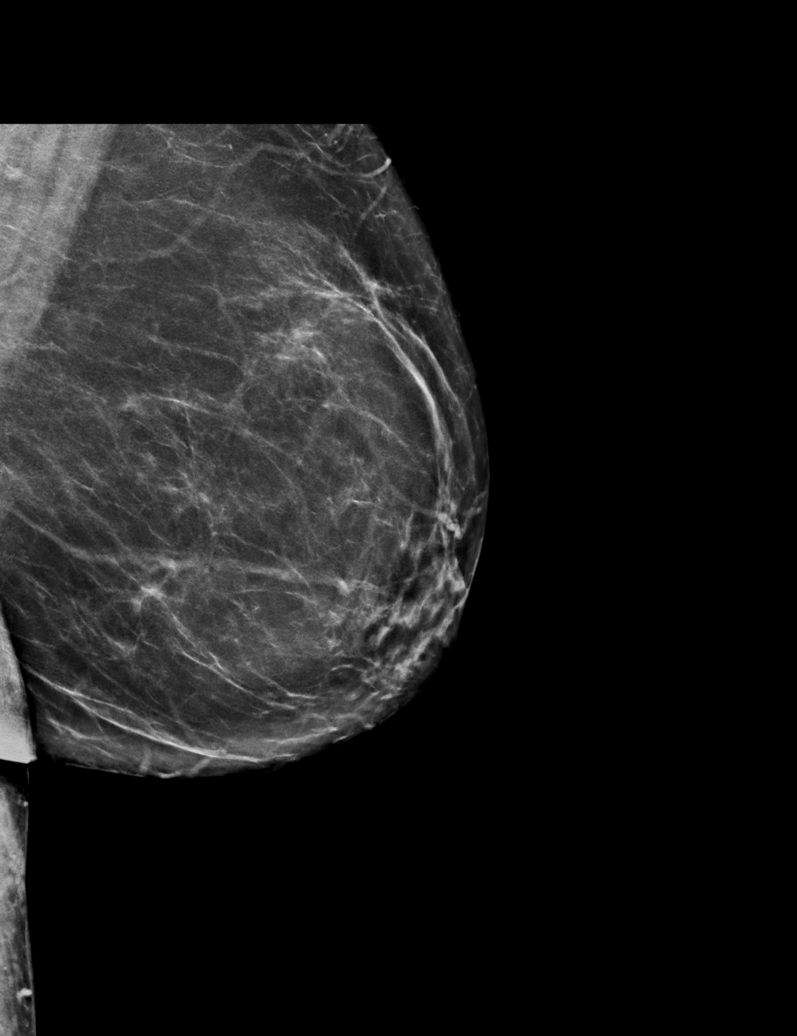

[R MLO synth-2D (2 of 2)]
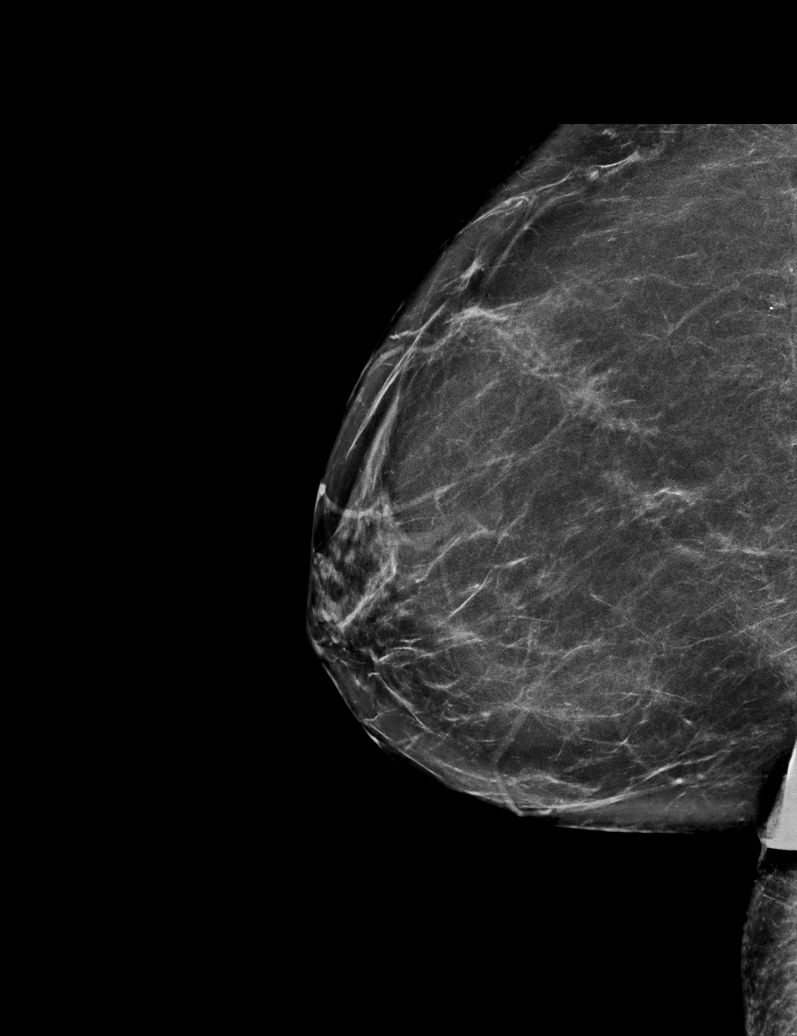

[L MLO synth-2D (2 of 2)]
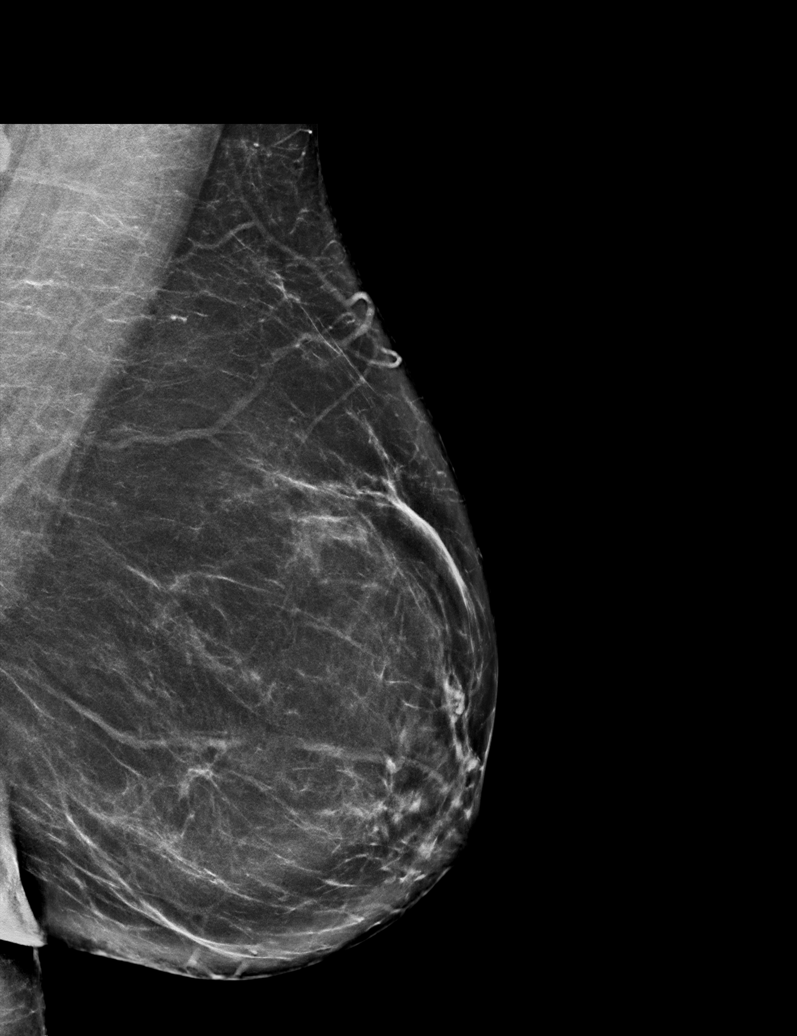

[R CC synth-2D]
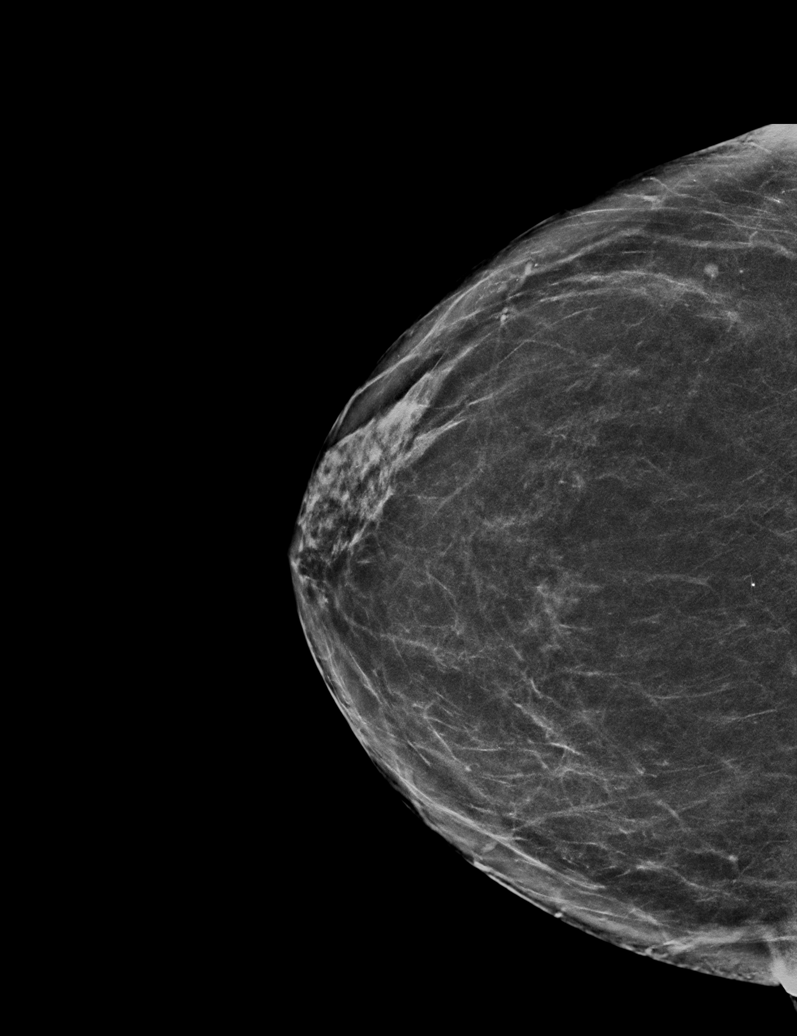

[L CC synth-2D]
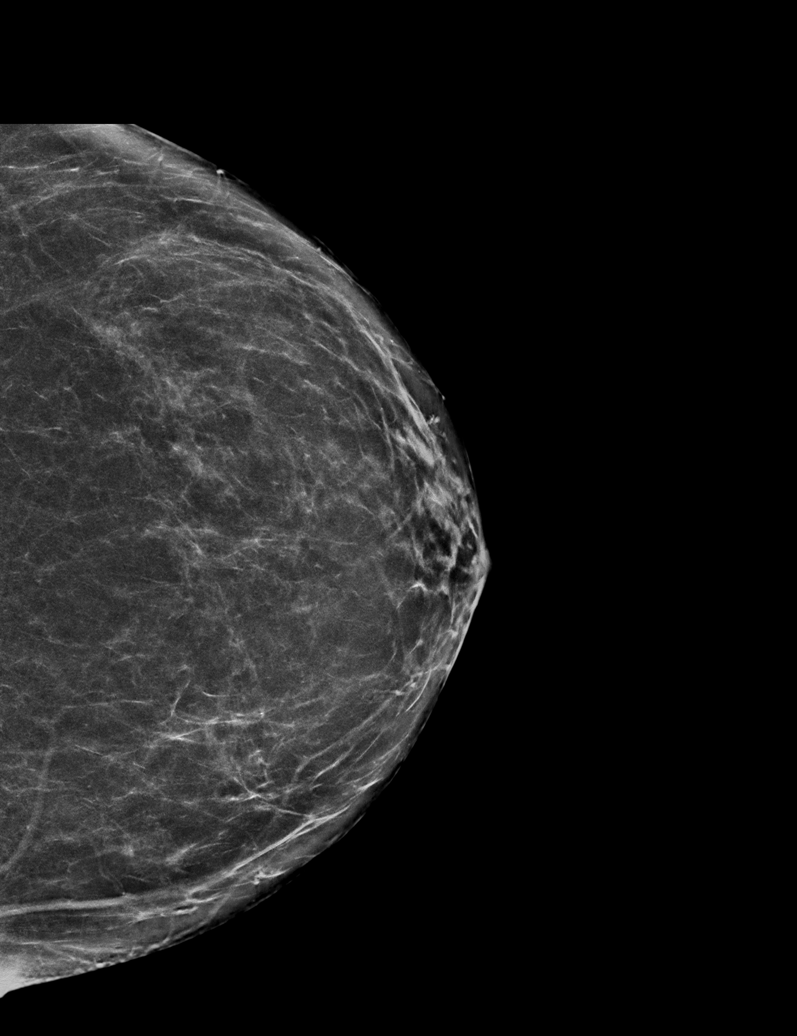

[6 of 36 positions shown; findings below may reference images not displayed]

ACR Breast Density Category b: There are scattered areas of
fibroglandular density.
FINDINGS: There are no findings suspicious for malignancy.
IMPRESSION: No mammographic evidence of malignancy. A result letter of this
screening mammogram will be mailed directly to the patient.

RECOMMENDATION:
Screening mammogram in one year. (Code:51-O-LD2)

BI-RADS CATEGORY  1: Negative.

## 2022-12-05 NOTE — Progress Notes (Signed)
Virtual Visit via Video Note  I connected with [name] on 12/11/22 at  9:30 AM EDT by a video enabled telemedicine application and verified that I am speaking with the correct person using two identifiers.  Location: Patient: home Provider: office Persons participated in the visit- patient, provider    I discussed the limitations of evaluation and management by telemedicine and the availability of in person appointments. The patient expressed understanding and agreed to proceed.     I discussed the assessment and treatment plan with the patient. The patient was provided an opportunity to ask questions and all were answered. The patient agreed with the plan and demonstrated an understanding of the instructions.   The patient was advised to call back or seek an in-person evaluation if the symptoms worsen or if the condition fails to improve as anticipated.  I provided 20 minutes of non-face-to-face time during this encounter.   [name], MD    [facility] MD/PA/NP OP Progress Note  12/11/2022 9:58 AM [name]  MRN:  [id number]  Chief Complaint:  Chief Complaint  Patient presents with   Follow-up   HPI:  This is a follow-up appointment for depression, anxiety and insomnia.  She states that things has been hectic.  She will be had a garage sale on her sisters house.  She has been getting there, and feels a little overwhelmed.  She feels glad that it will be over soon.  She restarted buspirone a few days after the previous visit with the hope to feel less anxious.  She is unsure if it has been helping.  She has leg pain, and has not been able to do much exercise.  She will be referred to a specialist for this.  She continues to have issues with intimacy.  She would like to try adjusting the medication.  She sleeps well with trazodone.  She denies change in appetite.  She denies SI.    Employment: unemployed, used to work at [employer]: husband Marital status:  married since 2008 Number of children:0   Visit Diagnosis:    ICD-10-CM   1. MDD (major depressive disorder), recurrent episode, mild (HCC)  F33.0     2. Insomnia, unspecified type  G47.00 traZODone (DESYREL) 150 MG tablet      Past Psychiatric History: Please see initial evaluation for full details. I have reviewed the history. No updates at this time.     Past Medical History:  Past Medical History:  Diagnosis Date   Anxiety    Depression    GERD (gastroesophageal reflux disease)    Hypertension    Meralgia paresthetica of right side 06/05/2017    Past Surgical History:  Procedure Laterality Date   KNEE CARTILAGE SURGERY Right    Workers Compensation, fell out of chair at work.     Family Psychiatric History: Please see initial evaluation for full details. I have reviewed the history. No updates at this time.    Family History:  Family History  Problem Relation Age of Onset   Alzheimer's disease Mother    Hypertension Sister    Diabetes Maternal Uncle    Hypertension Sister     Social History:  Social History   Socioeconomic History   Marital status: Married    Spouse name: Not on file   Number of children: Not on file   Years of education: Not on file   Highest education level: Not on file  Occupational History   Not on file  Tobacco Use   Smoking status: Former    Current packs/day: 0.00    Types: Cigarettes    Quit date: 10/13/1983    Years since quitting: 39.1   Smokeless tobacco: Never  Substance and Sexual Activity   Alcohol use: No   Drug use: No   Sexual activity: Not Currently    Birth control/protection: Post-menopausal  Other Topics Concern   Not on file  Social History Narrative   Lives   Caffeine use:    Married. 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    Spends every 3rd night at [employer].    Has been taking care of mother since 3.    Social Determinants of Health   Financial Resource Strain: Not on file   Food Insecurity: Not on file  Transportation Needs: Not on file  Physical Activity: Not on file  Stress: Not on file  Social Connections: Not on file    Allergies: No Known Allergies  Metabolic Disorder Labs: Lab Results  Component Value Date   HGBA1C 5.5 11/06/2016   HGBA1C 5.5 11/06/2016   MPG 111 11/06/2016   No results found for: "PROLACTIN" Lab Results  Component Value Date   CHOL 149 10/30/2017   TRIG 73 10/30/2017   HDL 48 (L) 10/30/2017   CHOLHDL 3.1 10/30/2017   LDLCALC 85 10/30/2017   LDLCALC 189 (H) 07/10/2017   Lab Results  Component Value Date   TSH 0.98 10/30/2017   TSH 1.23 11/06/2016   TSH 1.23 11/06/2016    Therapeutic Level Labs: No results found for: "LITHIUM" No results found for: "VALPROATE" Lab Results  Component Value Date   CBMZ 7.8 07/29/2017    Current Medications: Current Outpatient Medications  Medication Sig Dispense Refill   vortioxetine HBr (TRINTELLIX) 10 MG TABS tablet Take 1 tablet (10 mg total) by mouth daily. 30 tablet 1   aspirin EC 81 MG tablet Take 81 mg by mouth daily.     atorvastatin (LIPITOR) 20 MG tablet Take 1 tablet (20 mg total) by mouth daily. 90 tablet 3   Biotin 5 MG TABS Use one po qd  0   buPROPion (WELLBUTRIN XL) 150 MG 24 hr tablet Take 1 tablet (150 mg total) by mouth daily. Total of 450 mg daily, along with 300 mg tab 90 tablet 1   buPROPion (WELLBUTRIN XL) 300 MG 24 hr tablet Take 1 tablet (300 mg total) by mouth daily. Take total of 450 mg daily, take along with 150 mg tab 90 tablet 1   chlorhexidine (PERIDEX) 0.12 % solution 5 mLs by Mouth Rinse route 2 (two) times daily.     Cholecalciferol 25 MCG (1000 UT) capsule Take 4,000 Units by mouth daily.     gabapentin (NEURONTIN) 300 MG capsule TAKE TWO (2) CAPSULES BY MOUTH THREE TIMES DAILY. 180 capsule 1   hydrochlorothiazide (HYDRODIURIL) 25 MG tablet Take 1 tablet (25 mg total) by mouth daily. 90 tablet 1   meloxicam (MOBIC) 15 MG tablet Take 1 tablet  (15 mg total) by mouth daily. 30 tablet 3   metoprolol succinate (TOPROL-XL) 50 MG 24 hr tablet Take 1 tablet (50 mg total) by mouth daily. Take with or immediately following a meal. 90 tablet 0   Multiple Vitamin (MULTIVITAMIN) capsule Take 1 capsule by mouth daily.     traZODone (DESYREL) 150 MG tablet Take 1 tablet (150 mg total) by mouth at bedtime as needed for sleep. 90 tablet 1   No current facility-administered medications  for this visit.     Musculoskeletal: Strength & Muscle Tone:  N/A Gait & Station:  N/A Patient leans: N/A  Psychiatric Specialty Exam: Review of Systems  There were no vitals taken for this visit.There is no height or weight on file to calculate BMI.  General Appearance: Well Groomed  Eye Contact:  Good  Speech:  Clear and Coherent  Volume:  Normal  Mood:  Anxious  Affect:  Appropriate, Congruent, and calm, slightly brighter  Thought Process:  Coherent  Orientation:  Full (Time, Place, and Person)  Thought Content: Logical   Suicidal Thoughts:  No  Homicidal Thoughts:  No  Memory:  Immediate;   Good  Judgement:  Good  Insight:  Good  Psychomotor Activity:  Normal  Concentration:  Concentration: Good and Attention Span: Good  Recall:  Good  Fund of Knowledge: Good  Language: Good  Akathisia:  No  Handed:  Right  AIMS (if indicated): not done  Assets:  Communication Skills Desire for Improvement  ADL's:  Intact  Cognition: WNL  Sleep:  Good   Screenings: GAD-7    Flowsheet Row Virtual [facility] Phone Follow Up from 10/29/2017 in [facility] Virtual [facility] Phone Follow Up from 09/25/2017 in [facility] Virtual [facility] Phone Follow Up from 05/28/2017 in [facility]  Total GAD-7 Score 12 17 14       PHQ2-9    Flowsheet Row Office Visit from 08/05/2022 in [facility] Office Visit from 05/17/2021 in [facility] Video Visit from 10/10/2020 in [facility] Video Visit from 07/11/2020 in [facility] Video Visit from 04/13/2020 in [facility]  PHQ-2 Total Score 2 2 1 1 2   PHQ-9 Total Score 6 4 -- -- 2      Flowsheet Row Video Visit from 10/10/2020 in [facility] Video Visit from 04/13/2020 in [facility] Virtual [facility] Phone Follow Up from 10/29/2017 in [facility]  C-SSRS RISK CATEGORY No Risk No Risk No Risk        Assessment and Plan:  [name] is a 67 y.o. year old female with a history of depression,meralgia paresthetica, hyperlipidemia, who presents for follow up appointment for below.    2. MDD (major depressive disorder), recurrent episode, mild (HCC) Acute stressors include:loss of her sister in Dec 2023, uncle June 2024 Other stressors include: loss of her mother in Dec 2022   History: struggling with depression for many years    She experiences occasional heightened anxiety in the setting of stressors of doing garage sale.  Although it was discussed with the patient to consider adjustment in her medication to mitigate risk of sexual dysfunction, she would like to do this adjustment at this time.  Will cross taper from venlafaxine to Trintellix to see if it mitigates sexual side effect.  Discussed potential risk of nausea, and serotonin syndrome.  Will continue bupropion to target depression.  Noted that she self restarted BuSpar since the last visit;  will continue BuSpar for anxiety.   1. Insomnia, unspecified type - not interested in sleep evaluation despite snoring Overall improving.  Will continue current dose of trazodone as needed for insomnia.    Plan Decrease venlafaxine 150 mg daily for one week, then 75 mg daily for one week, then discontinue  Start  Trintellix 10 mg daily  Continue buspar 5 mg twice day  Continue  bupropion 450 mg daily  Continue Trazodone 150 mg at night  Next appointment: 10/23 at 9 30, video - on gabapentin 600 mg three times a day   Past trials of medication: sertraline, fluoxetine, venlafaxine, xanax, clonazepam, temazepam, Ambien   The patient demonstrates the following risk factors for suicide: Chronic risk factors for suicide include: psychiatric disorder of depression. Acute risk factors for suicide include: N/A. Protective factors for this patient include: coping skills and hope for the future. Considering these factors, the overall suicide risk at this point appears to be low. Patient is appropriate for outpatient follow up.    Collaboration of Care: Collaboration of Care: Other reviewed notes in Epic  Patient/Guardian was advised Release of Information must be obtained prior to any record release in order to collaborate their care with an outside provider. Patient/Guardian was advised if they have not already done so to contact the registration department to sign all necessary forms in order for [place] to release information regarding their care.   Consent: Patient/Guardian gives verbal consent for treatment and assignment of benefits for services provided during this visit. Patient/Guardian expressed understanding and agreed to proceed.    [name], MD 12/11/2022, 9:58 AM

## 2022-12-11 ENCOUNTER — Encounter: Payer: Self-pay | Admitting: Psychiatry

## 2022-12-11 ENCOUNTER — Telehealth (INDEPENDENT_AMBULATORY_CARE_PROVIDER_SITE_OTHER): Payer: Medicare HMO | Admitting: Psychiatry

## 2022-12-11 DIAGNOSIS — G4700 Insomnia, unspecified: Secondary | ICD-10-CM

## 2022-12-11 DIAGNOSIS — F330 Major depressive disorder, recurrent, mild: Secondary | ICD-10-CM | POA: Diagnosis not present

## 2022-12-11 MED ORDER — TRAZODONE HCL 150 MG PO TABS: 150.0000 mg | ORAL_TABLET | Freq: Every evening | ORAL | 1 refills | Status: DC | PRN

## 2022-12-11 MED ORDER — VORTIOXETINE HBR 10 MG PO TABS: 10.0000 mg | ORAL_TABLET | Freq: Every day | ORAL | 1 refills | Status: DC

## 2022-12-11 NOTE — Patient Instructions (Signed)
Decrease venlafaxine 150 mg daily for one week, then 75 mg daily for one week, then discontinue  Start Trintellix 10 mg daily  Continue buspar 5 mg twice day  Continue  bupropion 450 mg daily  Continue Trazodone 150 mg at night  Next appointment: 10/23 at 9 30

## 2022-12-19 ENCOUNTER — Ambulatory Visit: Payer: Medicare HMO | Admitting: Orthopaedic Surgery

## 2022-12-19 ENCOUNTER — Encounter: Payer: Self-pay | Admitting: Orthopaedic Surgery

## 2022-12-19 ENCOUNTER — Other Ambulatory Visit (INDEPENDENT_AMBULATORY_CARE_PROVIDER_SITE_OTHER): Payer: Medicare HMO

## 2022-12-19 VITALS — Ht 60.5 in | Wt 178.0 lb

## 2022-12-19 DIAGNOSIS — M25561 Pain in right knee: Secondary | ICD-10-CM

## 2022-12-19 DIAGNOSIS — G8929 Other chronic pain: Secondary | ICD-10-CM | POA: Diagnosis not present

## 2022-12-19 DIAGNOSIS — M25562 Pain in left knee: Secondary | ICD-10-CM

## 2022-12-19 DIAGNOSIS — M67432 Ganglion, left wrist: Secondary | ICD-10-CM | POA: Insufficient documentation

## 2022-12-19 DIAGNOSIS — M1711 Unilateral primary osteoarthritis, right knee: Secondary | ICD-10-CM

## 2022-12-19 MED ORDER — LIDOCAINE HCL 1 % IJ SOLN
0.5000 mL | INTRAMUSCULAR | Status: AC | PRN
  Administered 2022-12-19: .5 mL

## 2022-12-19 MED ORDER — LIDOCAINE HCL 1 % IJ SOLN
0.5000 mL | INTRAMUSCULAR | Status: DC | PRN
Start: 2022-12-19 — End: 2022-12-19

## 2022-12-19 MED ORDER — SODIUM HYALURONATE (VISCOSUP) 20 MG/2ML IX SOSY
20.0000 mg | PREFILLED_SYRINGE | INTRA_ARTICULAR | Status: DC | PRN
Start: 2022-12-19 — End: 2022-12-19

## 2022-12-19 NOTE — Progress Notes (Deleted)
Office Visit Note   Patient: [name]           Date of Birth: 1955/06/30           MRN: [id number] Visit Date: 12/19/2022              Requested by: Aliene Beams, MD [phone] [place],  [place] [address] PCP: Aliene Beams, MD   Assessment & Plan: Visit Diagnoses:  1. Bilateral chronic knee pain     Plan: Second Euflexxa injection performed right knee.  Follow-Up Instructions: No follow-ups on file.   Orders:  Orders Placed This Encounter  Procedures   XR KNEE 3 VIEW RIGHT   XR KNEE 3 VIEW LEFT   No orders of the defined types were placed in this encounter.     Procedures: Large Joint Inj: R knee on 12/19/2022 2:01 PM Indications: pain and joint swelling Details: 22 G 1.5 in needle, anterolateral approach  Arthrogram: No  Medications: 0.5 mL lidocaine 1 %; 20 mg Sodium Hyaluronate (Viscosup) 20 MG/2ML Outcome: tolerated well, no immediate complications Procedure, treatment alternatives, risks and benefits explained, specific risks discussed. Consent was given by the patient. Immediately prior to procedure a time out was called to verify the correct patient, procedure, equipment, support staff and site/side marked as required. Patient was prepped and draped in the usual sterile fashion.       Clinical Data: No additional findings.   Subjective: Chief Complaint  Patient presents with   Right Knee - Pain   Left Knee - Pain   Left Wrist - Follow-up    HPI patient here for second Euflexxa injection right knee.  Review of Systems no change   Objective: Vital Signs: Ht 5' 0.5" (1.537 m)   Wt 178 lb (80.7 kg)   BMI 34.19 kg/m   Physical Exam no change  Ortho Exam no change  Specialty Comments:  No specialty comments available.  Imaging: No results found.   PMFS History: Patient Active Problem List   Diagnosis Date Noted   Left wrist tendonitis 07/15/2019   MDD (major depressive disorder), recurrent, in full  remission (HCC) 04/01/2019   Benign lipomatous neoplasm of skin, subcu of right leg 06/27/2017   Meralgia paresthetica of right side 06/05/2017   Sciatica of right side 01/29/2017   Anxiety 11/13/2016   Need for immunization against influenza 11/13/2016   Hyperlipidemia LDL goal <100 11/13/2016   Insomnia 11/13/2016   HTN, goal below 140/90 11/13/2016   Past Medical History:  Diagnosis Date   Anxiety    Depression    GERD (gastroesophageal reflux disease)    Hypertension    Meralgia paresthetica of right side 06/05/2017    Family History  Problem Relation Age of Onset   Alzheimer's disease Mother    Hypertension Sister    Diabetes Maternal Uncle    Hypertension Sister     Past Surgical History:  Procedure Laterality Date   KNEE CARTILAGE SURGERY Right    Workers Compensation, fell out of chair at work.    Social History   Occupational History   Not on file  Tobacco Use   Smoking status: Former    Current packs/day: 0.00    Types: Cigarettes    Quit date: 10/13/1983    Years since quitting: 39.2   Smokeless tobacco: Never  Substance and Sexual Activity   Alcohol use: No   Drug use: No   Sexual activity: Not Currently    Birth control/protection:  Post-menopausal

## 2022-12-19 NOTE — Progress Notes (Signed)
Office Visit Note   Patient: [name]           Date of Birth: Jun 19, 1955           MRN: [id number] Visit Date: 12/19/2022              Requested by: Aliene Beams, MD [phone] [place],  [place] [address] PCP: Aliene Beams, MD   Assessment & Plan: Visit Diagnoses:  1. Bilateral chronic knee pain   2. Ganglion of left wrist   3. Unilateral primary osteoarthritis, right knee     Plan: Left wrist ganglion was aspirated.  Milking fluid out post aspiration.  She can call if she would like to have it excised if it recurs.  Reviewed x-rays of her knee which shows medial compartment narrowing  Follow-Up Instructions: No follow-ups on file.   Orders:  Orders Placed This Encounter  Procedures   XR KNEE 3 VIEW RIGHT   XR KNEE 3 VIEW LEFT   Meds ordered this encounter  Medications   DISCONTD: Sodium Hyaluronate (Viscosup) SOSY 20 mg   DISCONTD: lidocaine (XYLOCAINE) 1 % (with pres) injection 0.5 mL      Procedures: Medium Joint Inj on 12/19/2022 3:19 PM Indications: pain Details: 22 G 1.5 in needle, dorsal approach Medications: 0.5 mL lidocaine 1 % Outcome: tolerated well, no immediate complications Procedure, treatment alternatives, risks and benefits explained, specific risks discussed. Consent was given by the patient. Immediately prior to procedure a time out was called to verify the correct patient, procedure, equipment, support staff and site/side marked as required. Patient was prepped and draped in the usual sterile fashion.       Clinical Data: No additional findings.   Subjective: Chief Complaint  Patient presents with   Right Knee - Pain   Left Knee - Pain   Left Wrist - Follow-up    HPI six 67-year-old female seen with right worse than left knee pain.  Previous medial arthroscopy meniscectomy right knee by Dr. Terrilee Croak years ago.  She has stiffness and problems walking.  She has also had problems with recurrence of a ganglion on  the dorsal left wrist which was aspirated back in May 2021 and she does not recall how many months past before it recurred.  Recently its gotten larger and more painful and she is requesting repeat aspiration of the left dorsal wrist ganglion.  Review of Systems all other systems are reported HPI.  She has been on some Neurontin for Meralgia paresthetica   Objective: Vital Signs: Ht 5' 0.5" (1.537 m)   Wt 178 lb (80.7 kg)   BMI 34.19 kg/m   Physical Exam Constitutional:      Appearance: She is well-developed.  HENT:     Head: Normocephalic.     Right Ear: External ear normal.     Left Ear: External ear normal. There is no impacted cerumen.  Eyes:     Pupils: Pupils are equal, round, and reactive to light.  Neck:     Thyroid: No thyromegaly.     Trachea: No tracheal deviation.  Cardiovascular:     Rate and Rhythm: Normal rate.  Pulmonary:     Effort: Pulmonary effort is normal.  Abdominal:     Palpations: Abdomen is soft.  Musculoskeletal:     Cervical back: No rigidity.  Skin:    General: Skin is warm and dry.  Neurological:     Mental Status: She is alert and oriented to person, place,  and time.  Psychiatric:        Behavior: Behavior normal.     Ortho Exam mild varus right knee mild right knee limp full extension good flexion medial joint line tenderness.  2 cm tender painful dorsal wrist ganglion.  No limitation range of motion of the wrist ganglion is more prominent with wrist palmar flexion.  Specialty Comments:  No specialty comments available.  Imaging: No results found.   PMFS History: Patient Active Problem List   Diagnosis Date Noted   Ganglion of left wrist 12/19/2022   Left wrist tendonitis 07/15/2019   MDD (major depressive disorder), recurrent, in full remission (HCC) 04/01/2019   Benign lipomatous neoplasm of skin, subcu of right leg 06/27/2017   Meralgia paresthetica of right side 06/05/2017   Sciatica of right side 01/29/2017   Anxiety  11/13/2016   Need for immunization against influenza 11/13/2016   Hyperlipidemia LDL goal <100 11/13/2016   Insomnia 11/13/2016   HTN, goal below 140/90 11/13/2016   Past Medical History:  Diagnosis Date   Anxiety    Depression    GERD (gastroesophageal reflux disease)    Hypertension    Meralgia paresthetica of right side 06/05/2017    Family History  Problem Relation Age of Onset   Alzheimer's disease Mother    Hypertension Sister    Diabetes Maternal Uncle    Hypertension Sister     Past Surgical History:  Procedure Laterality Date   KNEE CARTILAGE SURGERY Right    Workers Compensation, fell out of chair at work.    Social History   Occupational History   Not on file  Tobacco Use   Smoking status: Former    Current packs/day: 0.00    Types: Cigarettes    Quit date: 10/13/1983    Years since quitting: 39.2   Smokeless tobacco: Never  Substance and Sexual Activity   Alcohol use: No   Drug use: No   Sexual activity: Not Currently    Birth control/protection: Post-menopausal

## 2022-12-19 NOTE — Addendum Note (Signed)
Addended by: Eldred Manges on: 12/19/2022 03:25 PM   Modules accepted: Level of Service

## 2023-01-19 NOTE — Progress Notes (Unsigned)
Virtual Visit via Telephone Note  I connected with [name] on 01/23/23 at 11:30 AM EST by telephone and verified that I am speaking with the correct person using two identifiers.  Location: Patient: home Provider: office Persons participated in the visit- patient, provider    I discussed the limitations, risks, security and privacy concerns of performing an evaluation and management service by telephone and the availability of in person appointments. I also discussed with the patient that there may be a patient responsible charge related to this service. The patient expressed understanding and agreed to proceed.   I discussed the assessment and treatment plan with the patient. The patient was provided an opportunity to ask questions and all were answered. The patient agreed with the plan and demonstrated an understanding of the instructions.   The patient was advised to call back or seek an in-person evaluation if the symptoms worsen or if the condition fails to improve as anticipated.  I provided 15 minutes of non-face-to-face time during this encounter.   [name], MD    [facility] MD/PA/NP OP Progress Note  01/23/2023 12:08 PM [name]  MRN:  [id number]  Chief Complaint:  Chief Complaint  Patient presents with   Follow-up   HPI:  This is a follow-up appointment for depression.  She states that she has cleaned up sister's house, and has close the house.  They are now negotiating with creditors.  She feels strange.  She misses her family member, with whom she celebrated on Thanksgiving the last year.  She is concerned about aging process, and death.  Although she has some depression as her loved one is gone, she denies concern about this.  She sleeps well.  She does not need trazodone at times.  She has good appetite.  She denies SI.  She continues to have decrease in libido.  She has not noticed any change since switching from venlafaxine to Trintellix.  She agrees with the  plan as outlined below.   Employment: unemployed, used to work at [employer]: husband Marital status: married since 2008 Number of children:0   Visit Diagnosis:    ICD-10-CM   1. MDD (major depressive disorder), recurrent episode, mild (HCC)  F33.0     2. Insomnia, unspecified type  G47.00       Past Psychiatric History: Please see initial evaluation for full details. I have reviewed the history. No updates at this time.     Past Medical History:  Past Medical History:  Diagnosis Date   Anxiety    Depression    GERD (gastroesophageal reflux disease)    Hypertension    Meralgia paresthetica of right side 06/05/2017    Past Surgical History:  Procedure Laterality Date   KNEE CARTILAGE SURGERY Right    Workers Compensation, fell out of chair at work.     Family Psychiatric History: Please see initial evaluation for full details. I have reviewed the history. No updates at this time.     Family History:  Family History  Problem Relation Age of Onset   Alzheimer's disease Mother    Hypertension Sister    Diabetes Maternal Uncle    Hypertension Sister     Social History:  Social History   Socioeconomic History   Marital status: Married    Spouse name: Not on file   Number of children: Not on file   Years of education: Not on file   Highest education level: Not on file  Occupational History  Not on file  Tobacco Use   Smoking status: Former    Current packs/day: 0.00    Types: Cigarettes    Quit date: 10/13/1983    Years since quitting: 39.3   Smokeless tobacco: Never  Substance and Sexual Activity   Alcohol use: No   Drug use: No   Sexual activity: Not Currently    Birth control/protection: Post-menopausal  Other Topics Concern   Not on file  Social History Narrative   Lives   Caffeine use:    Married. 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    Spends every 3rd night at [employer].    Has been taking care of mother since  70.    Social Determinants of Health   Financial Resource Strain: Not on file  Food Insecurity: Not on file  Transportation Needs: Not on file  Physical Activity: Not on file  Stress: Not on file  Social Connections: Not on file    Allergies: No Known Allergies  Metabolic Disorder Labs: Lab Results  Component Value Date   HGBA1C 5.5 11/06/2016   HGBA1C 5.5 11/06/2016   MPG 111 11/06/2016   No results found for: "PROLACTIN" Lab Results  Component Value Date   CHOL 149 10/30/2017   TRIG 73 10/30/2017   HDL 48 (L) 10/30/2017   CHOLHDL 3.1 10/30/2017   LDLCALC 85 10/30/2017   LDLCALC 189 (H) 07/10/2017   Lab Results  Component Value Date   TSH 0.98 10/30/2017   TSH 1.23 11/06/2016   TSH 1.23 11/06/2016    Therapeutic Level Labs: No results found for: "LITHIUM" No results found for: "VALPROATE" Lab Results  Component Value Date   CBMZ 7.8 07/29/2017    Current Medications: Current Outpatient Medications  Medication Sig Dispense Refill   aspirin EC 81 MG tablet Take 81 mg by mouth daily.     atorvastatin (LIPITOR) 20 MG tablet Take 1 tablet (20 mg total) by mouth daily. 90 tablet 3   Biotin 5 MG TABS Use one po qd  0   [START ON 03/20/2023] buPROPion (WELLBUTRIN XL) 150 MG 24 hr tablet Take 1 tablet (150 mg total) by mouth daily. Total of 450 mg daily, along with 300 mg tab 90 tablet 1   [START ON 03/08/2023] buPROPion (WELLBUTRIN XL) 300 MG 24 hr tablet Take 1 tablet (300 mg total) by mouth daily. Take total of 450 mg daily, take along with 150 mg tab 90 tablet 1   chlorhexidine (PERIDEX) 0.12 % solution 5 mLs by Mouth Rinse route 2 (two) times daily.     Cholecalciferol 25 MCG (1000 UT) capsule Take 4,000 Units by mouth daily.     gabapentin (NEURONTIN) 300 MG capsule TAKE TWO (2) CAPSULES BY MOUTH THREE TIMES DAILY. 180 capsule 1   hydrochlorothiazide (HYDRODIURIL) 25 MG tablet Take 1 tablet (25 mg total) by mouth daily. 90 tablet 1   meloxicam (MOBIC) 15 MG  tablet Take 1 tablet (15 mg total) by mouth daily. 30 tablet 3   metoprolol succinate (TOPROL-XL) 50 MG 24 hr tablet Take 1 tablet (50 mg total) by mouth daily. Take with or immediately following a meal. 90 tablet 0   Multiple Vitamin (MULTIVITAMIN) capsule Take 1 capsule by mouth daily.     traZODone (DESYREL) 150 MG tablet Take 1 tablet (150 mg total) by mouth at bedtime as needed for sleep. 90 tablet 1   [START ON 02/09/2023] vortioxetine HBr (TRINTELLIX) 10 MG TABS tablet Take 1 tablet (10 mg  total) by mouth daily. 90 tablet 0   No current facility-administered medications for this visit.     Musculoskeletal: Strength & Muscle Tone:  N/A Gait & Station:  N/A Patient leans: N/A  Psychiatric Specialty Exam: Review of Systems  Psychiatric/Behavioral:  Positive for dysphoric mood. Negative for agitation, behavioral problems, confusion, decreased concentration, hallucinations, self-injury, sleep disturbance and suicidal ideas. The patient is not nervous/anxious and is not hyperactive.   All other systems reviewed and are negative.   There were no vitals taken for this visit.There is no height or weight on file to calculate BMI.  General Appearance: NA  Eye Contact:  NA  Speech:  Clear and Coherent  Volume:  Normal  Mood:   good  Affect:  Appropriate, Congruent, and Full Range  Thought Process:  Coherent  Orientation:  Full (Time, Place, and Person)  Thought Content: Logical   Suicidal Thoughts:  No  Homicidal Thoughts:  No  Memory:  Immediate;   Good  Judgement:  Good  Insight:  Good  Psychomotor Activity:  Normal  Concentration:  Concentration: Good and Attention Span: Good  Recall:  Good  Fund of Knowledge: Good  Language: Good  Akathisia:  No  Handed:  Right  AIMS (if indicated): not done  Assets:  Communication Skills Desire for Improvement  ADL's:  Intact  Cognition: WNL  Sleep:  Good   Screenings: GAD-7    [employer] [facility] Phone Follow Up from  10/29/2017 in [facility] Virtual [facility] Phone Follow Up from 09/25/2017 in [facility] [facility] Virtual [facility] Phone Follow Up from 05/28/2017 in [facility]  Total GAD-7 Score 12 17 14       PHQ2-9    Flowsheet Row Office Visit from 08/05/2022 in [facility] Office Visit from 05/17/2021 in [facility] Video Visit from 10/10/2020 in [facility] Video Visit from 07/11/2020 in [facility] Video Visit from 04/13/2020 in [facility]  PHQ-2 Total Score 2 2 1 1 2   PHQ-9 Total Score 6 4 -- -- 2      Flowsheet Row Video Visit from 10/10/2020 in [facility] Video Visit from 04/13/2020 in [facility] Virtual [facility] Phone Follow Up from 10/29/2017 in [facility]  C-SSRS RISK CATEGORY No Risk No Risk No Risk        Assessment and Plan:  Alan Schwieger is a 67 y.o. year old female with a history of depression,meralgia paresthetica, hyperlipidemia, who presents for follow up appointment for below.   ,1. MDD (major depressive disorder), recurrent episode, mild (HCC) Acute stressors include:loss of her sister in Dec 2023, uncle June 2024 Other stressors include: loss of her mother in Dec 2022   History: struggling with depression for many years    Although she continues to experience down mood in the context of loss of loved one, it has been overall manageable since switching from venlafaxine to Trintellix.  This medication adjustment was done to see if it mitigates sexual side effect.  Will continue current dose at this time to see if it exerts more benefit over time.  Will continue bupropion adjunctive treatment for depression.  Will continue BuSpar  for anxiety.    2. Insomnia, unspecified type - not interested in sleep evaluation despite snoring  Overall improving.  Will continue trazodone as needed for insomnia.    Plan Continue Trintellix 10 mg daily  Continue buspar 5 mg twice day  Continue  bupropion 450 mg daily  Continue Trazodone 150 mg at night  Next appointment: 1/30 at 3 pm, IP - on gabapentin 600 mg three times a day   Past trials of medication: sertraline, fluoxetine, venlafaxine, xanax, clonazepam, temazepam, Ambien   The patient demonstrates the following risk factors for suicide: Chronic risk factors for suicide include: psychiatric disorder of depression. Acute risk factors for suicide include: N/A. Protective factors for this patient include: coping skills and hope for the future. Considering these factors, the overall suicide risk at this point appears to be low. Patient is appropriate for outpatient follow up.    Collaboration of Care: Collaboration of Care: Other reviewed notes in Epic  Patient/Guardian was advised Release of Information must be obtained prior to any record release in order to collaborate their care with an outside provider. Patient/Guardian was advised if they have not already done so to contact the registration department to sign all necessary forms in order for [place] to release information regarding their care.   Consent: Patient/Guardian gives verbal consent for treatment and assignment of benefits for services provided during this visit. Patient/Guardian expressed understanding and agreed to proceed.    [name], MD 01/23/2023, 12:08 PM

## 2023-01-23 ENCOUNTER — Telehealth: Payer: Medicare HMO | Admitting: Psychiatry

## 2023-01-23 ENCOUNTER — Encounter: Payer: Self-pay | Admitting: Psychiatry

## 2023-01-23 DIAGNOSIS — F330 Major depressive disorder, recurrent, mild: Secondary | ICD-10-CM | POA: Diagnosis not present

## 2023-01-23 DIAGNOSIS — G4700 Insomnia, unspecified: Secondary | ICD-10-CM | POA: Diagnosis not present

## 2023-01-23 MED ORDER — BUPROPION HCL ER (XL) 300 MG PO TB24: 300.0000 mg | ORAL_TABLET | Freq: Every day | ORAL | 1 refills | Status: DC

## 2023-01-23 MED ORDER — VORTIOXETINE HBR 10 MG PO TABS: 10.0000 mg | ORAL_TABLET | Freq: Every day | ORAL | 0 refills | Status: AC

## 2023-01-23 MED ORDER — BUPROPION HCL ER (XL) 150 MG PO TB24: 150.0000 mg | ORAL_TABLET | Freq: Every day | ORAL | 1 refills | Status: DC

## 2023-01-23 NOTE — Patient Instructions (Signed)
Continue Trintellix 10 mg daily  Continue buspar 5 mg twice day  Continue  bupropion 450 mg daily  Continue Trazodone 150 mg at night  Next appointment: 1/30 at 3 pm

## 2023-02-20 ENCOUNTER — Encounter: Payer: Self-pay | Admitting: Orthopaedic Surgery

## 2023-02-20 ENCOUNTER — Ambulatory Visit (INDEPENDENT_AMBULATORY_CARE_PROVIDER_SITE_OTHER): Payer: Medicare HMO | Admitting: Orthopaedic Surgery

## 2023-02-20 VITALS — Ht 60.0 in | Wt 178.0 lb

## 2023-02-20 DIAGNOSIS — M67432 Ganglion, left wrist: Secondary | ICD-10-CM

## 2023-02-20 NOTE — Progress Notes (Signed)
 Office Visit Note   Patient: [name]           Date of Birth: Aug 17, 1955           MRN: [id number] Visit Date: 02/20/2023              Requested by: Rolinda Millman, MD [id number] [address] [place],  [place] [address] PCP: Rolinda Millman, MD   Assessment & Plan: Visit Diagnoses:  1. Ganglion of left wrist     Plan: Patient may proceed with ganglion excision she has had a aspirated taken anti-inflammatories splinted.  It is enlarged and is more painful.  We discussed potential recurrence after surgery with 10% chance.  Previous plain radiographs showed no degenerative arthritic changes in her wrist.  We discussed outpatient surgery for removal of the ganglion risks of infection possible recurrence discussed Decision for surgery made today. Follow-Up Instructions: No follow-ups on file.   Orders:  No orders of the defined types were placed in this encounter.  No orders of the defined types were placed in this encounter.     Procedures: No procedures performed   Clinical Data: No additional findings.   Subjective: Chief Complaint  Patient presents with   Right Knee - Follow-up   Left Knee - Follow-up   Left Wrist - Follow-up    HPI 68 year old female with previous aspiration left wrist ganglion dorsally 12/19/2022.  Is gone for several weeks then has recurred and she states it is larger and more painful.  She is right-hand dominant states she was helping clean out her sister's house after she passed away in 02/24/24 and since then she has had significant increased swelling not responding to anti-inflammatories resting and splinting.  Aspiration previously showed typical ganglion fluid.  Previous carpal tunnel release without recurrence of symptoms.  She has had previous x-rays which were negative for degenerative changes but did show mild dorsal soft tissue swelling in February 24, 2020.  Review of Systems patient has some hypertension hyperlipidemia previous carpal tunnel  release history of sciatica on the right mild depression.  No neck pain.   Objective: Vital Signs: Ht 5' (1.524 m)   Wt 178 lb (80.7 kg)   BMI 34.76 kg/m   Physical Exam Constitutional:      Appearance: She is well-developed.  HENT:     Head: Normocephalic.     Right Ear: External ear normal.     Left Ear: External ear normal. There is no impacted cerumen.  Eyes:     Pupils: Pupils are equal, round, and reactive to light.  Neck:     Thyroid: No thyromegaly.     Trachea: No tracheal deviation.  Cardiovascular:     Rate and Rhythm: Normal rate.  Pulmonary:     Effort: Pulmonary effort is normal.  Abdominal:     Palpations: Abdomen is soft.  Musculoskeletal:     Cervical back: No rigidity.  Skin:    General: Skin is warm and dry.  Neurological:     Mental Status: She is alert and oriented to person, place, and time.  Psychiatric:        Behavior: Behavior normal.     Ortho Exam 3 x 3 cm left dorsal wrist ganglion tender appears to have at least 2 lobes to it.  She has full extension of her digits.  No tenderness over the scaphoid.  Well-healed carpal tunnel release no thenar atrophy good sensation the fingertips.  Specialty Comments:  No specialty comments available.  Imaging: No results found.   PMFS History: Patient Active Problem List   Diagnosis Date Noted   Ganglion of left wrist 12/19/2022   Left wrist tendonitis 07/15/2019   MDD (major depressive disorder), recurrent, in full remission (HCC) 04/01/2019   Benign lipomatous neoplasm of skin, subcu of right leg 06/27/2017   Meralgia paresthetica of right side 06/05/2017   Sciatica of right side 01/29/2017   Anxiety 11/13/2016   Need for immunization against influenza 11/13/2016   Hyperlipidemia LDL goal <100 11/13/2016   Insomnia 11/13/2016   HTN, goal below 140/90 11/13/2016   Past Medical History:  Diagnosis Date   Anxiety    Depression    GERD (gastroesophageal reflux disease)    Hypertension     Meralgia paresthetica of right side 06/05/2017    Family History  Problem Relation Age of Onset   Alzheimer's disease Mother    Hypertension Sister    Diabetes Maternal Uncle    Hypertension Sister     Past Surgical History:  Procedure Laterality Date   KNEE CARTILAGE SURGERY Right    Workers Compensation, fell out of chair at work.    Social History   Occupational History   Not on file  Tobacco Use   Smoking status: Former    Current packs/day: 0.00    Types: Cigarettes    Quit date: 10/13/1983    Years since quitting: 39.3   Smokeless tobacco: Never  Substance and Sexual Activity   Alcohol use: No   Drug use: No   Sexual activity: Not Currently    Birth control/protection: Post-menopausal

## 2023-03-20 ENCOUNTER — Ambulatory Visit: Payer: Medicare HMO | Admitting: Psychiatry

## 2023-03-24 ENCOUNTER — Other Ambulatory Visit: Payer: Self-pay | Admitting: Orthopaedic Surgery

## 2023-03-24 DIAGNOSIS — M67432 Ganglion, left wrist: Secondary | ICD-10-CM | POA: Diagnosis not present

## 2023-03-24 MED ORDER — HYDROCODONE-ACETAMINOPHEN 5-325 MG PO TABS: 1.0000 | ORAL_TABLET | Freq: Four times a day (QID) | ORAL | 0 refills | Status: AC | PRN

## 2023-04-03 ENCOUNTER — Ambulatory Visit: Payer: Medicare HMO

## 2023-04-09 ENCOUNTER — Encounter: Payer: Medicare HMO | Admitting: Orthopaedic Surgery

## 2023-04-16 ENCOUNTER — Ambulatory Visit (INDEPENDENT_AMBULATORY_CARE_PROVIDER_SITE_OTHER): Payer: Medicare HMO | Admitting: Orthopaedic Surgery

## 2023-04-16 DIAGNOSIS — M67432 Ganglion, left wrist: Secondary | ICD-10-CM

## 2023-04-17 NOTE — Progress Notes (Signed)
   Post-Op Visit Note   Patient: [name]           Date of Birth: Jan 06, 1956           MRN: [id number] Visit Date: 04/16/2023 PCP: Aliene Beams, MD   Assessment & Plan: Postop ganglion excision left wrist.  Subcuticular nylon suture removed today.  Steri-Strips applied recheck 2 weeks.  She has some mild puffiness not enough for aspiration.  Continue wrist splint some intermittent ice and elevation.  Chief Complaint:  Chief Complaint  Patient presents with   Left Wrist - Routine Post Op, Follow-up    03/24/2023 Excision left wrist ganglion   Visit Diagnoses:  1. Ganglion of left wrist     Plan: Wound CHEK2 weeks.  Follow-Up Instructions: No follow-ups on file.   Orders:  No orders of the defined types were placed in this encounter.  No orders of the defined types were placed in this encounter.   Imaging: No results found.  PMFS History: Patient Active Problem List   Diagnosis Date Noted   Ganglion of left wrist 12/19/2022   Left wrist tendonitis 07/15/2019   MDD (major depressive disorder), recurrent, in full remission (HCC) 04/01/2019   Benign lipomatous neoplasm of skin, subcu of right leg 06/27/2017   Meralgia paresthetica of right side 06/05/2017   Sciatica of right side 01/29/2017   Anxiety 11/13/2016   Need for immunization against influenza 11/13/2016   Hyperlipidemia LDL goal <100 11/13/2016   Insomnia 11/13/2016   HTN, goal below 140/90 11/13/2016   Past Medical History:  Diagnosis Date   Anxiety    Depression    GERD (gastroesophageal reflux disease)    Hypertension    Meralgia paresthetica of right side 06/05/2017    Family History  Problem Relation Age of Onset   Alzheimer's disease Mother    Hypertension Sister    Diabetes Maternal Uncle    Hypertension Sister     Past Surgical History:  Procedure Laterality Date   KNEE CARTILAGE SURGERY Right    Workers Compensation, fell out of chair at work.    Social History   Occupational  History   Not on file  Tobacco Use   Smoking status: Former    Current packs/day: 0.00    Types: Cigarettes    Quit date: 10/13/1983    Years since quitting: 39.5   Smokeless tobacco: Never  Substance and Sexual Activity   Alcohol use: No   Drug use: No   Sexual activity: Not Currently    Birth control/protection: Post-menopausal

## 2023-05-01 ENCOUNTER — Ambulatory Visit: Admitting: Orthopaedic Surgery

## 2023-05-01 DIAGNOSIS — M67432 Ganglion, left wrist: Secondary | ICD-10-CM

## 2023-05-01 NOTE — Progress Notes (Signed)
   Post-Op Visit Note   Patient: [name]           Date of Birth: 04/09/1955           MRN: [id number] Visit Date: 05/01/2023 PCP: Aliene Beams, MD   Assessment & Plan: Follow-up dorsal ganglion incision.  Midportion incision has tiny spot opening originally no ganglion fluid could be expressed after she moves her wrist some tiny drop of serosanguineous fluid.  1 Steri-Strip applied.  Stock[place]ette she can use and wear her wrist splint to immobilize the wrist.  I will check her next week on Wednesday in 6 days.  Chief Complaint: Left wrist-postop ganglion excision dorsal. Visit Diagnoses:  1. Ganglion of left wrist     Plan: ROV 6 days. Soap , water lotion, splint , elevation.   Follow-Up Instructions: No follow-ups on file.   Orders:  No orders of the defined types were placed in this encounter.  No orders of the defined types were placed in this encounter.   Imaging: No results found.  PMFS History: Patient Active Problem List   Diagnosis Date Noted   Ganglion of left wrist 12/19/2022   Left wrist tendonitis 07/15/2019   MDD (major depressive disorder), recurrent, in full remission (HCC) 04/01/2019   Benign lipomatous neoplasm of skin, subcu of right leg 06/27/2017   Meralgia paresthetica of right side 06/05/2017   Sciatica of right side 01/29/2017   Anxiety 11/13/2016   Need for immunization against influenza 11/13/2016   Hyperlipidemia LDL goal <100 11/13/2016   Insomnia 11/13/2016   HTN, goal below 140/90 11/13/2016   Past Medical History:  Diagnosis Date   Anxiety    Depression    GERD (gastroesophageal reflux disease)    Hypertension    Meralgia paresthetica of right side 06/05/2017    Family History  Problem Relation Age of Onset   Alzheimer's disease Mother    Hypertension Sister    Diabetes Maternal Uncle    Hypertension Sister     Past Surgical History:  Procedure Laterality Date   KNEE CARTILAGE SURGERY Right    Workers Compensation, fell  out of chair at work.    Social History   Occupational History   Not on file  Tobacco Use   Smoking status: Former    Current packs/day: 0.00    Types: Cigarettes    Quit date: 10/13/1983    Years since quitting: 39.5   Smokeless tobacco: Never  Substance and Sexual Activity   Alcohol use: No   Drug use: No   Sexual activity: Not Currently    Birth control/protection: Post-menopausal

## 2023-05-07 ENCOUNTER — Ambulatory Visit: Admitting: Orthopaedic Surgery

## 2023-05-07 DIAGNOSIS — M67432 Ganglion, left wrist: Secondary | ICD-10-CM

## 2023-05-07 NOTE — Progress Notes (Signed)
   Post-Op Visit Note   Patient: [name]           Date of Birth: April 04, 1955           MRN: [id number] Visit Date: 05/07/2023 PCP: Aliene Beams, MD   Assessment & Plan: Follow-up ganglion excision.  Swelling is down.  She can work her way out of the wrist splint of follow-up as needed incision looks good.  Tiny ganglion noted dorsum of the opposite right wrist which had not previously been present but is asymptomatic and is extremely small.  Chief Complaint:  Chief Complaint  Patient presents with   Left Wrist - Follow-up, Routine Post Op    03/24/23 Excision left wrist ganglion   Visit Diagnoses:  1. Ganglion of left wrist     Plan: Patient will return as needed.  Follow-Up Instructions: No follow-ups on file.   Orders:  No orders of the defined types were placed in this encounter.  No orders of the defined types were placed in this encounter.   Imaging: No results found.  PMFS History: Patient Active Problem List   Diagnosis Date Noted   Ganglion of left wrist 12/19/2022   Left wrist tendonitis 07/15/2019   MDD (major depressive disorder), recurrent, in full remission (HCC) 04/01/2019   Benign lipomatous neoplasm of skin, subcu of right leg 06/27/2017   Meralgia paresthetica of right side 06/05/2017   Sciatica of right side 01/29/2017   Anxiety 11/13/2016   Need for immunization against influenza 11/13/2016   Hyperlipidemia LDL goal <100 11/13/2016   Insomnia 11/13/2016   HTN, goal below 140/90 11/13/2016   Past Medical History:  Diagnosis Date   Anxiety    Depression    GERD (gastroesophageal reflux disease)    Hypertension    Meralgia paresthetica of right side 06/05/2017    Family History  Problem Relation Age of Onset   Alzheimer's disease Mother    Hypertension Sister    Diabetes Maternal Uncle    Hypertension Sister     Past Surgical History:  Procedure Laterality Date   KNEE CARTILAGE SURGERY Right    Workers Compensation, fell out of  chair at work.    Social History   Occupational History   Not on file  Tobacco Use   Smoking status: Former    Current packs/day: 0.00    Types: Cigarettes    Quit date: 10/13/1983    Years since quitting: 39.5   Smokeless tobacco: Never  Substance and Sexual Activity   Alcohol use: No   Drug use: No   Sexual activity: Not Currently    Birth control/protection: Post-menopausal

## 2023-05-10 NOTE — Progress Notes (Unsigned)
 BH MD/PA/NP OP Progress Note  05/13/2023 4:37 PM [name]  MRN:  [id number]  Chief Complaint:  Chief Complaint  Patient presents with   Follow-up   HPI:  This is a follow-up appointment for depression and insomnia.  She states that she sold her sister's house.  She thinks about her sister, mother and her uncle, who passed away.  She thinks about the aspects of caring, and she hates that they are not here.  She talks with her other sister.  She feels irritated by her husband, who is trying to be a boss.  She states that her sister also used to be a boss to her.  He makes comments about how she spends money.  She has started gardening.  She has an appointment for landscape, although she has not told this to him.  She continues to have issues with intimacy.  She does not incising medication has caused this, and there has been no difference since being on Trintellix.  She is concerned about the cost of Trintellix.  She sleeps good.  She denies change in appetite.  She stays depressed.  She denies SI.  She agrees with the plan as outlined below.   Wt Readings from Last 3 Encounters:  05/13/23 176 lb 9.6 oz (80.1 kg)  02/20/23 178 lb (80.7 kg)  12/19/22 178 lb (80.7 kg)     Employment: unemployed, used to work at [employer]: husband Marital status: married since 2008 Number of children:0   Visit Diagnosis:    ICD-10-CM   1. MDD (major depressive disorder), recurrent episode, mild (HCC)  F33.0     2. Insomnia, unspecified type  G47.00 traZODone (DESYREL) 150 MG tablet      Past Psychiatric History: Please see initial evaluation for full details. I have reviewed the history. No updates at this time.     Past Medical History:  Past Medical History:  Diagnosis Date   Anxiety    Depression    GERD (gastroesophageal reflux disease)    Hypertension    Meralgia paresthetica of right side 06/05/2017    Past Surgical History:  Procedure Laterality Date   KNEE CARTILAGE  SURGERY Right    Workers Compensation, fell out of chair at work.     Family Psychiatric History: Please see initial evaluation for full details. I have reviewed the history. No updates at this time.     Family History:  Family History  Problem Relation Age of Onset   Alzheimer's disease Mother    Hypertension Sister    Diabetes Maternal Uncle    Hypertension Sister     Social History:  Social History   Socioeconomic History   Marital status: Married    Spouse name: Not on file   Number of children: Not on file   Years of education: Not on file   Highest education level: Not on file  Occupational History   Not on file  Tobacco Use   Smoking status: Former    Current packs/day: 0.00    Types: Cigarettes    Quit date: 10/13/1983    Years since quitting: 39.6   Smokeless tobacco: Never  Substance and Sexual Activity   Alcohol use: No   Drug use: No   Sexual activity: Not Currently    Birth control/protection: Post-menopausal  Other Topics Concern   Not on file  Social History Narrative   Lives   Caffeine use:    Married. 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  Spends every 3rd night at [employer].    Has been taking care of mother since 66.    Social Drivers of Corporate investment banker Strain: Not on file  Food Insecurity: Not on file  Transportation Needs: Not on file  Physical Activity: Not on file  Stress: Not on file  Social Connections: Not on file    Allergies: No Known Allergies  Metabolic Disorder Labs: Lab Results  Component Value Date   HGBA1C 5.5 11/06/2016   HGBA1C 5.5 11/06/2016   MPG 111 11/06/2016   No results found for: "PROLACTIN" Lab Results  Component Value Date   CHOL 149 10/30/2017   TRIG 73 10/30/2017   HDL 48 (L) 10/30/2017   CHOLHDL 3.1 10/30/2017   LDLCALC 85 10/30/2017   LDLCALC 189 (H) 07/10/2017   Lab Results  Component Value Date   TSH 0.98 10/30/2017   TSH 1.23 11/06/2016   TSH 1.23  11/06/2016    Therapeutic Level Labs: No results found for: "LITHIUM" No results found for: "VALPROATE" Lab Results  Component Value Date   CBMZ 7.8 07/29/2017    Current Medications: Current Outpatient Medications  Medication Sig Dispense Refill   aspirin EC 81 MG tablet Take 81 mg by mouth daily.     atorvastatin (LIPITOR) 20 MG tablet Take 1 tablet (20 mg total) by mouth daily. 90 tablet 3   Biotin 5 MG TABS Use one po qd  0   buPROPion (WELLBUTRIN XL) 150 MG 24 hr tablet Take 1 tablet (150 mg total) by mouth daily. Total of 450 mg daily, along with 300 mg tab 90 tablet 1   buPROPion (WELLBUTRIN XL) 300 MG 24 hr tablet Take 1 tablet (300 mg total) by mouth daily. Take total of 450 mg daily, take along with 150 mg tab 90 tablet 1   chlorhexidine (PERIDEX) 0.12 % solution 5 mLs by Mouth Rinse route 2 (two) times daily.     Cholecalciferol 25 MCG (1000 UT) capsule Take 4,000 Units by mouth daily.     gabapentin (NEURONTIN) 300 MG capsule TAKE TWO (2) CAPSULES BY MOUTH THREE TIMES DAILY. 180 capsule 1   hydrochlorothiazide (HYDRODIURIL) 25 MG tablet Take 1 tablet (25 mg total) by mouth daily. 90 tablet 1   HYDROcodone-acetaminophen (NORCO/VICODIN) 5-325 MG tablet Take 1-2 tablets by mouth every 6 (six) hours as needed for moderate pain (pain score 4-6). 30 tablet 0   meloxicam (MOBIC) 15 MG tablet Take 1 tablet (15 mg total) by mouth daily. 30 tablet 3   metoprolol succinate (TOPROL-XL) 50 MG 24 hr tablet Take 1 tablet (50 mg total) by mouth daily. Take with or immediately following a meal. 90 tablet 0   Multiple Vitamin (MULTIVITAMIN) capsule Take 1 capsule by mouth daily.     venlafaxine XR (EFFEXOR-XR) 150 MG 24 hr capsule Take 1 capsule (150 mg total) by mouth daily. Start 75 mg daily for one week, then 150 mg daily for one week, then 225 mg daily. Take along with 75 mg cap 90 capsule 0   venlafaxine XR (EFFEXOR-XR) 75 MG 24 hr capsule Take 1 capsule (75 mg total) by mouth daily with  breakfast. Start 75 mg daily for one week, then 150 mg daily for one week, then 225 mg daily. Take along with 150 mg cap 90 capsule 0   [START ON 06/09/2023] traZODone (DESYREL) 150 MG tablet Take 1 tablet (150 mg total) by mouth at bedtime as needed for sleep. 90 tablet 1  No current facility-administered medications for this visit.     Musculoskeletal: Strength & Muscle Tone: within normal limits Gait & Station: normal Patient leans: N/A  Psychiatric Specialty Exam: Review of Systems  Psychiatric/Behavioral:  Positive for dysphoric mood. Negative for agitation, behavioral problems, confusion, decreased concentration, hallucinations, self-injury, sleep disturbance and suicidal ideas. The patient is nervous/anxious. The patient is not hyperactive.   All other systems reviewed and are negative.   Blood pressure 120/78, pulse 78, temperature 98.5 F (36.9 C), temperature source Temporal, height 5' (1.524 m), weight 176 lb 9.6 oz (80.1 kg), SpO2 98%.Body mass index is 34.49 kg/m.  General Appearance: Well Groomed  Eye Contact:  Good  Speech:  Clear and Coherent  Volume:  Normal  Mood:   depressed  Affect:  Appropriate, Congruent, and slightly down, but calm, reactive  Thought Process:  Coherent  Orientation:  Full (Time, Place, and Person)  Thought Content: Logical   Suicidal Thoughts:  No  Homicidal Thoughts:  No  Memory:  Immediate;   Good  Judgement:  Good  Insight:  Good  Psychomotor Activity:  Normal  Concentration:  Concentration: Good and Attention Span: Good  Recall:  Good  Fund of Knowledge: Good  Language: Good  Akathisia:  No  Handed:  Right  AIMS (if indicated): not done  Assets:  Communication Skills Desire for Improvement  ADL's:  Intact  Cognition: WNL  Sleep:  Fair   Screenings: GAD-7    Naval architect BH Phone Follow Up from 10/29/2017 in [facility] Virtual [facility] Phone Follow Up from 09/25/2017 in [facility] [place]  [facility] Phone Follow Up from 05/28/2017 in [facility]  Total GAD-7 Score 12 17 14       PHQ2-9    Flowsheet Row Office Visit from 08/05/2022 in [facility] Office Visit from 05/17/2021 in [facility] Video Visit from 10/10/2020 in [facility] Video Visit from 07/11/2020 in [facility] Video Visit from 04/13/2020 in [facility]  PHQ-2 Total Score 2 2 1 1 2   PHQ-9 Total Score 6 4 -- -- 2      Flowsheet Row Video Visit from 10/10/2020 in [facility] Video Visit from 04/13/2020 in [facility] Virtual [facility] Phone Follow Up from 10/29/2017 in [facility]  C-SSRS RISK CATEGORY No Risk No Risk No Risk        Assessment and Plan:  [name] is a 68 y.o. year old female with a history of depression,meralgia paresthetica, hyperlipidemia, who presents for follow up appointment for below.   1. MDD (major depressive disorder), recurrent episode, mild (HCC) Acute stressors include:loss of her sister in Dec 2023, uncle June 2024 Other stressors include: loss of her mother in Dec 2022   History: struggling with depression for many years    Although she continues to experience depressive symptoms, she has started to enjoy gardening, which coincided with switching from venlafaxine to Trintellix.  However, it is difficult to afford this medication. She is also uncertain whether she experienced an adverse reaction to venlafaxine or if her difficulty with intimacy is related to her lack of connection with her husband. Will cross taper from Trintellix to venlafaxine as it is more affordable.  Will continue bupropion adjunctive treatment for depression.  Will  continue BuSpar  for anxiety. Coached behavioral activation.   2. Insomnia, unspecified type - not interested in sleep evaluation despite snoring   Overall improving.  Will continue trazodone as needed for insomnia.    Plan Decrease Trintellix 5 mg daily for one week, then discontinue  Start venlafaxine 75 mg daily for one week, then increase to 150 mg daily for one week, then 225 mg daily  Continue buspar 5 mg twice day  Continue  bupropion 450 mg daily  Continue Trazodone 150 mg at night  Next appointment: 5/19 at 4 :30, IP - on gabapentin 600 mg three times a day   Past trials of medication: sertraline, fluoxetine, venlafaxine, xanax, clonazepam, temazepam, Ambien   The patient demonstrates the following risk factors for suicide: Chronic risk factors for suicide include: psychiatric disorder of depression. Acute risk factors for suicide include: N/A. Protective factors for this patient include: coping skills and hope for the future. Considering these factors, the overall suicide risk at this point appears to be low. Patient is appropriate for outpatient follow up.    Collaboration of Care: Collaboration of Care: Other reviewed notes in Epic  Patient/Guardian was advised Release of Information must be obtained prior to any record release in order to collaborate their care with an outside provider. Patient/Guardian was advised if they have not already done so to contact the registration department to sign all necessary forms in order for [place] to release information regarding their care.   Consent: Patient/Guardian gives verbal consent for treatment and assignment of benefits for services provided during this visit. Patient/Guardian expressed understanding and agreed to proceed.    [name], MD 05/13/2023, 4:37 PM

## 2023-05-13 ENCOUNTER — Ambulatory Visit (INDEPENDENT_AMBULATORY_CARE_PROVIDER_SITE_OTHER): Payer: Self-pay | Admitting: Psychiatry

## 2023-05-13 ENCOUNTER — Encounter: Payer: Self-pay | Admitting: Psychiatry

## 2023-05-13 VITALS — BP 120/78 | HR 78 | Temp 98.5°F | Ht 60.0 in | Wt 176.6 lb

## 2023-05-13 DIAGNOSIS — F330 Major depressive disorder, recurrent, mild: Secondary | ICD-10-CM | POA: Diagnosis not present

## 2023-05-13 DIAGNOSIS — G4700 Insomnia, unspecified: Secondary | ICD-10-CM | POA: Diagnosis not present

## 2023-05-13 MED ORDER — TRAZODONE HCL 150 MG PO TABS: 150.0000 mg | ORAL_TABLET | Freq: Every evening | ORAL | 1 refills | Status: DC | PRN

## 2023-05-13 MED ORDER — VENLAFAXINE HCL ER 75 MG PO CP24: 75.0000 mg | ORAL_CAPSULE | Freq: Every day | ORAL | 0 refills | Status: DC

## 2023-05-13 MED ORDER — VENLAFAXINE HCL ER 150 MG PO CP24: 150.0000 mg | ORAL_CAPSULE | Freq: Every day | ORAL | 0 refills | Status: DC

## 2023-05-13 NOTE — Patient Instructions (Signed)
 Decrease Trintellix 5 mg daily for one week, then discontinue  Start venlafaxine 75 mg daily for one week, then increase to 150 mg daily for one week, then 225 mg daily  Continue buspar 5 mg twice day  Continue  bupropion 450 mg daily  Continue Trazodone 150 mg at night  Next appointment: 5/19 at 4 :30

## 2023-07-01 NOTE — Progress Notes (Deleted)
 BH MD/PA/NP OP Progress Note  07/01/2023 12:45 PM [name]  MRN:  [id number]  Chief Complaint: No chief complaint on file.  HPI: ***  Employment: unemployed, used to work at [employer]: husband Marital status: married since 2008 Number of children:0   Visit Diagnosis: No diagnosis found.  Past Psychiatric History: Please see initial evaluation for full details. I have reviewed the history. No updates at this time.     Past Medical History:  Past Medical History:  Diagnosis Date   Anxiety    Depression    GERD (gastroesophageal reflux disease)    Hypertension    Meralgia paresthetica of right side 06/05/2017    Past Surgical History:  Procedure Laterality Date   KNEE CARTILAGE SURGERY Right    Workers Compensation, fell out of chair at work.     Family Psychiatric History: Please see initial evaluation for full details. I have reviewed the history. No updates at this time.     Family History:  Family History  Problem Relation Age of Onset   Alzheimer's disease Mother    Hypertension Sister    Diabetes Maternal Uncle    Hypertension Sister     Social History:  Social History   Socioeconomic History   Marital status: Married    Spouse name: Not on file   Number of children: Not on file   Years of education: Not on file   Highest education level: Not on file  Occupational History   Not on file  Tobacco Use   Smoking status: Former    Current packs/day: 0.00    Types: Cigarettes    Quit date: 10/13/1983    Years since quitting: 39.7   Smokeless tobacco: Never  Substance and Sexual Activity   Alcohol use: No   Drug use: No   Sexual activity: Not Currently    Birth control/protection: Post-menopausal  Other Topics Concern   Not on file  Social History Narrative   Lives   Caffeine use:    Married. 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    Spends every 3rd night at [employer].    Has been taking care of mother since  65.    Social Drivers of Corporate investment banker Strain: Not on file  Food Insecurity: Not on file  Transportation Needs: Not on file  Physical Activity: Not on file  Stress: Not on file  Social Connections: Not on file    Allergies: No Known Allergies  Metabolic Disorder Labs: Lab Results  Component Value Date   HGBA1C 5.5 11/06/2016   HGBA1C 5.5 11/06/2016   MPG 111 11/06/2016   No results found for: "PROLACTIN" Lab Results  Component Value Date   CHOL 149 10/30/2017   TRIG 73 10/30/2017   HDL 48 (L) 10/30/2017   CHOLHDL 3.1 10/30/2017   LDLCALC 85 10/30/2017   LDLCALC 189 (H) 07/10/2017   Lab Results  Component Value Date   TSH 0.98 10/30/2017   TSH 1.23 11/06/2016   TSH 1.23 11/06/2016    Therapeutic Level Labs: No results found for: "LITHIUM" No results found for: "VALPROATE" Lab Results  Component Value Date   CBMZ 7.8 07/29/2017    Current Medications: Current Outpatient Medications  Medication Sig Dispense Refill   aspirin EC 81 MG tablet Take 81 mg by mouth daily.     atorvastatin  (LIPITOR) 20 MG tablet Take 1 tablet (20 mg total) by mouth daily. 90 tablet 3   Biotin  5 MG  TABS Use one po qd  0   buPROPion  (WELLBUTRIN  XL) 150 MG 24 hr tablet Take 1 tablet (150 mg total) by mouth daily. Total of 450 mg daily, along with 300 mg tab 90 tablet 1   buPROPion  (WELLBUTRIN  XL) 300 MG 24 hr tablet Take 1 tablet (300 mg total) by mouth daily. Take total of 450 mg daily, take along with 150 mg tab 90 tablet 1   chlorhexidine (PERIDEX) 0.12 % solution 5 mLs by Mouth Rinse route 2 (two) times daily.     Cholecalciferol 25 MCG (1000 UT) capsule Take 4,000 Units by mouth daily.     gabapentin  (NEURONTIN ) 300 MG capsule TAKE TWO (2) CAPSULES BY MOUTH THREE TIMES DAILY. 180 capsule 1   hydrochlorothiazide  (HYDRODIURIL ) 25 MG tablet Take 1 tablet (25 mg total) by mouth daily. 90 tablet 1   HYDROcodone -acetaminophen  (NORCO/VICODIN) 5-325 MG tablet Take 1-2 tablets  by mouth every 6 (six) hours as needed for moderate pain (pain score 4-6). 30 tablet 0   meloxicam  (MOBIC ) 15 MG tablet Take 1 tablet (15 mg total) by mouth daily. 30 tablet 3   metoprolol  succinate (TOPROL -XL) 50 MG 24 hr tablet Take 1 tablet (50 mg total) by mouth daily. Take with or immediately following a meal. 90 tablet 0   Multiple Vitamin (MULTIVITAMIN) capsule Take 1 capsule by mouth daily.     traZODone  (DESYREL ) 150 MG tablet Take 1 tablet (150 mg total) by mouth at bedtime as needed for sleep. 90 tablet 1   venlafaxine  XR (EFFEXOR -XR) 150 MG 24 hr capsule Take 1 capsule (150 mg total) by mouth daily. Start 75 mg daily for one week, then 150 mg daily for one week, then 225 mg daily. Take along with 75 mg cap 90 capsule 0   venlafaxine  XR (EFFEXOR -XR) 75 MG 24 hr capsule Take 1 capsule (75 mg total) by mouth daily with breakfast. Start 75 mg daily for one week, then 150 mg daily for one week, then 225 mg daily. Take along with 150 mg cap 90 capsule 0   No current facility-administered medications for this visit.     Musculoskeletal: Strength & Muscle Tone: within normal limits Gait & Station: normal Patient leans: N/A  Psychiatric Specialty Exam: Review of Systems  There were no vitals taken for this visit.There is no height or weight on file to calculate BMI.  General Appearance: {Appearance:22683}  Eye Contact:  {BHH EYE CONTACT:22684}  Speech:  Clear and Coherent  Volume:  Normal  Mood:  {BHH MOOD:22306}  Affect:  {Affect (PAA):22687}  Thought Process:  Coherent  Orientation:  Full (Time, Place, and Person)  Thought Content: Logical   Suicidal Thoughts:  {ST/HT (PAA):22692}  Homicidal Thoughts:  {ST/HT (PAA):22692}  Memory:  Immediate;   Good  Judgement:  {Judgement (PAA):22694}  Insight:  {Insight (PAA):22695}  Psychomotor Activity:  Normal  Concentration:  Concentration: Good and Attention Span: Good  Recall:  Good  Fund of Knowledge: Good  Language: Good   Akathisia:  No  Handed:  Right  AIMS (if indicated): not done  Assets:  Communication Skills Desire for Improvement  ADL's:  Intact  Cognition: WNL  Sleep:  {BHH GOOD/FAIR/POOR:22877}   Screenings: GAD-7    Naval architect BH Phone Follow Up from 10/29/2017 in [facility] Phone Follow Up from 09/25/2017 in [facility] Phone Follow Up from 05/28/2017 in [facility]  Total GAD-7 Score  12 17 14       PHQ2-9    Flowsheet Row Office Visit from 08/05/2022 in [facility] Office Visit from 05/17/2021 in [facility] Video Visit from 10/10/2020 in [facility] Video Visit from 07/11/2020 in [facility] Video Visit from 04/13/2020 in [facility]  PHQ-2 Total Score 2 2 1 1 2   PHQ-9 Total Score 6 4 -- -- 2      Flowsheet Row Video Visit from 10/10/2020 in [facility] Video Visit from 04/13/2020 in [facility] Virtual [facility] Phone Follow Up from 10/29/2017 in [facility]  C-SSRS RISK CATEGORY No Risk No Risk No Risk        Assessment and Plan:  Shemaiah Demars is a 68 y.o. year old female with a history of depression,meralgia paresthetica, hyperlipidemia, who presents for follow up appointment for below.    1. MDD (major depressive disorder), recurrent episode, mild (HCC) Acute stressors include:loss of her sister in Dec 2023, uncle June 2024 Other stressors include: loss of her mother in Dec 2022   History: struggling with depression for many years    Although she continues to experience depressive symptoms, she has started to enjoy gardening, which coincided with switching from venlafaxine  to  Trintellix .  However, it is difficult to afford this medication. She is also uncertain whether she experienced an adverse reaction to venlafaxine  or if her difficulty with intimacy is related to her lack of connection with her husband. Will cross taper from Trintellix  to venlafaxine  as it is more affordable.  Will continue bupropion  adjunctive treatment for depression.  Will continue BuSpar  for anxiety. Coached behavioral activation.    2. Insomnia, unspecified type - not interested in sleep evaluation despite snoring   Overall improving.  Will continue trazodone  as needed for insomnia.    Plan Decrease Trintellix  5 mg daily for one week, then discontinue  Start venlafaxine  75 mg daily for one week, then increase to 150 mg daily for one week, then 225 mg daily  Continue buspar  5 mg twice day  Continue  bupropion  450 mg daily  Continue Trazodone  150 mg at night  Next appointment: 5/19 at 4 :30, IP - on gabapentin  600 mg three times a day   Past trials of medication: sertraline , fluoxetine, venlafaxine , xanax, clonazepam , temazepam , Ambien   The patient demonstrates the following risk factors for suicide: Chronic risk factors for suicide include: psychiatric disorder of depression. Acute risk factors for suicide include: N/A. Protective factors for this patient include: coping skills and hope for the future. Considering these factors, the overall suicide risk at this point appears to be low. Patient is appropriate for outpatient follow up.  Collaboration of Care: Collaboration of Care: {BH OP Collaboration of Care:21014065}  Patient/Guardian was advised Release of Information must be obtained prior to any record release in order to collaborate their care with an outside provider. Patient/Guardian was advised if they have not already done so to contact the registration department to sign all necessary forms in order for us  to release information regarding their care.   Consent: Patient/Guardian  gives verbal consent for treatment and assignment of benefits for services provided during this visit. Patient/Guardian expressed understanding and agreed to proceed.    Todd Fossa, MD 07/01/2023, 12:45 PM

## 2023-07-07 ENCOUNTER — Ambulatory Visit: Admitting: Psychiatry

## 2023-08-17 NOTE — Progress Notes (Deleted)
 BH MD/PA/NP OP Progress Note  08/17/2023 10:40 AM [name]  MRN:  [id number]  Chief Complaint: No chief complaint on file.  HPI: ***   Employment: unemployed, used to work at [employer]: husband Marital status: married since 2008 Number of children:0   Visit Diagnosis: No diagnosis found.  Past Psychiatric History: Please see initial evaluation for full details. I have reviewed the history. No updates at this time.     Past Medical History:  Past Medical History:  Diagnosis Date   Anxiety    Depression    GERD (gastroesophageal reflux disease)    Hypertension    Meralgia paresthetica of right side 06/05/2017    Past Surgical History:  Procedure Laterality Date   KNEE CARTILAGE SURGERY Right    Workers Compensation, fell out of chair at work.     Family Psychiatric History: Please see initial evaluation for full details. I have reviewed the history. No updates at this time.     Family History:  Family History  Problem Relation Age of Onset   Alzheimer's disease Mother    Hypertension Sister    Diabetes Maternal Uncle    Hypertension Sister     Social History:  Social History   Socioeconomic History   Marital status: Married    Spouse name: Not on file   Number of children: Not on file   Years of education: Not on file   Highest education level: Not on file  Occupational History   Not on file  Tobacco Use   Smoking status: Former    Current packs/day: 0.00    Types: Cigarettes    Quit date: 10/13/1983    Years since quitting: 39.8   Smokeless tobacco: Never  Substance and Sexual Activity   Alcohol use: No   Drug use: No   Sexual activity: Not Currently    Birth control/protection: Post-menopausal  Other Topics Concern   Not on file  Social History Narrative   Lives   Caffeine use:    Married. 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    Spends every 3rd night at [employer].    Has been taking care of mother since  10.    Social Drivers of Corporate investment banker Strain: Not on file  Food Insecurity: Not on file  Transportation Needs: Not on file  Physical Activity: Not on file  Stress: Not on file  Social Connections: Not on file    Allergies: No Known Allergies  Metabolic Disorder Labs: Lab Results  Component Value Date   HGBA1C 5.5 11/06/2016   HGBA1C 5.5 11/06/2016   MPG 111 11/06/2016   No results found for: PROLACTIN Lab Results  Component Value Date   CHOL 149 10/30/2017   TRIG 73 10/30/2017   HDL 48 (L) 10/30/2017   CHOLHDL 3.1 10/30/2017   LDLCALC 85 10/30/2017   LDLCALC 189 (H) 07/10/2017   Lab Results  Component Value Date   TSH 0.98 10/30/2017   TSH 1.23 11/06/2016   TSH 1.23 11/06/2016    Therapeutic Level Labs: No results found for: LITHIUM No results found for: VALPROATE Lab Results  Component Value Date   CBMZ 7.8 07/29/2017    Current Medications: Current Outpatient Medications  Medication Sig Dispense Refill   aspirin EC 81 MG tablet Take 81 mg by mouth daily.     atorvastatin  (LIPITOR) 20 MG tablet Take 1 tablet (20 mg total) by mouth daily. 90 tablet 3   Biotin  5  MG TABS Use one po qd  0   buPROPion  (WELLBUTRIN  XL) 150 MG 24 hr tablet Take 1 tablet (150 mg total) by mouth daily. Total of 450 mg daily, along with 300 mg tab 90 tablet 1   buPROPion  (WELLBUTRIN  XL) 300 MG 24 hr tablet Take 1 tablet (300 mg total) by mouth daily. Take total of 450 mg daily, take along with 150 mg tab 90 tablet 1   chlorhexidine (PERIDEX) 0.12 % solution 5 mLs by Mouth Rinse route 2 (two) times daily.     Cholecalciferol 25 MCG (1000 UT) capsule Take 4,000 Units by mouth daily.     gabapentin  (NEURONTIN ) 300 MG capsule TAKE TWO (2) CAPSULES BY MOUTH THREE TIMES DAILY. 180 capsule 1   hydrochlorothiazide  (HYDRODIURIL ) 25 MG tablet Take 1 tablet (25 mg total) by mouth daily. 90 tablet 1   HYDROcodone -acetaminophen  (NORCO/VICODIN) 5-325 MG tablet Take 1-2 tablets  by mouth every 6 (six) hours as needed for moderate pain (pain score 4-6). 30 tablet 0   meloxicam  (MOBIC ) 15 MG tablet Take 1 tablet (15 mg total) by mouth daily. 30 tablet 3   metoprolol  succinate (TOPROL -XL) 50 MG 24 hr tablet Take 1 tablet (50 mg total) by mouth daily. Take with or immediately following a meal. 90 tablet 0   Multiple Vitamin (MULTIVITAMIN) capsule Take 1 capsule by mouth daily.     traZODone  (DESYREL ) 150 MG tablet Take 1 tablet (150 mg total) by mouth at bedtime as needed for sleep. 90 tablet 1   venlafaxine  XR (EFFEXOR -XR) 150 MG 24 hr capsule Take 1 capsule (150 mg total) by mouth daily. Start 75 mg daily for one week, then 150 mg daily for one week, then 225 mg daily. Take along with 75 mg cap 90 capsule 0   venlafaxine  XR (EFFEXOR -XR) 75 MG 24 hr capsule Take 1 capsule (75 mg total) by mouth daily with breakfast. Start 75 mg daily for one week, then 150 mg daily for one week, then 225 mg daily. Take along with 150 mg cap 90 capsule 0   No current facility-administered medications for this visit.     Musculoskeletal: Strength & Muscle Tone: within normal limits Gait & Station: normal Patient leans: N/A  Psychiatric Specialty Exam: Review of Systems  There were no vitals taken for this visit.There is no height or weight on file to calculate BMI.  General Appearance: {Appearance:22683}  Eye Contact:  {BHH EYE CONTACT:22684}  Speech:  Clear and Coherent  Volume:  Normal  Mood:  {BHH MOOD:22306}  Affect:  {Affect (PAA):22687}  Thought Process:  Coherent  Orientation:  Full (Time, Place, and Person)  Thought Content: Logical   Suicidal Thoughts:  {ST/HT (PAA):22692}  Homicidal Thoughts:  {ST/HT (PAA):22692}  Memory:  Immediate;   Good  Judgement:  {Judgement (PAA):22694}  Insight:  {Insight (PAA):22695}  Psychomotor Activity:  Normal  Concentration:  Concentration: Good and Attention Span: Good  Recall:  Good  Fund of Knowledge: Good  Language: Good   Akathisia:  No  Handed:  Right  AIMS (if indicated): not done  Assets:  Communication Skills Desire for Improvement  ADL's:  Intact  Cognition: WNL  Sleep:  {BHH GOOD/FAIR/POOR:22877}   Screenings: GAD-7    [employer]al [employer] BH Phone Follow Up from 10/29/2017 in [facility] Phone Follow Up from 09/25/2017 in [facility] Phone Follow Up from 05/28/2017 in [facility]  Total GAD-7  Score 12 17 14    PHQ2-9    Flowsheet Row Office Visit from 08/05/2022 in [facility] Office Visit from 05/17/2021 in [facility] Video Visit from 10/10/2020 in [facility] Video Visit from 07/11/2020 in [facility] Video Visit from 04/13/2020 in [facility]  PHQ-2 Total Score 2 2 1 1 2   PHQ-9 Total Score 6 4 -- -- 2   Flowsheet Row Video Visit from 10/10/2020 in [facility] Video Visit from 04/13/2020 in [facility] Virtual [facility] Phone Follow Up from 10/29/2017 in [facility]  C-SSRS RISK CATEGORY No Risk No Risk No Risk     Assessment and Plan:  [name] is a 68 y.o. year old female with a history of depression,meralgia paresthetica, hyperlipidemia, who presents for follow up appointment for below.    1. MDD (major depressive disorder), recurrent episode, mild (HCC) Acute stressors include  Other stressors include: loss of her mother in Dec 2022, sister in Dec 2023, uncle in June 2024 History: struggling with depression for many years    Although she continues to experience depressive symptoms, she has started to enjoy gardening, which coincided with switching from venlafaxine  to Trintellix .  However, it  is difficult to afford this medication. She is also uncertain whether she experienced an adverse reaction to venlafaxine  or if her difficulty with intimacy is related to her lack of connection with her husband. Will cross taper from Trintellix  to venlafaxine  as it is more affordable.  Will continue bupropion  adjunctive treatment for depression.  Will continue BuSpar  for anxiety. Coached behavioral activation.    2. Insomnia, unspecified type - not interested in sleep evaluation despite snoring   Overall improving.  Will continue trazodone  as needed for insomnia.    Plan Decrease Trintellix  5 mg daily for one week, then discontinue  Start venlafaxine  75 mg daily for one week, then increase to 150 mg daily for one week, then 225 mg daily  Continue buspar  5 mg twice day  Continue  bupropion  450 mg daily  Continue Trazodone  150 mg at night  Next appointment: 5/19 at 4 :30, IP - on gabapentin  600 mg three times a day   Past trials of medication: sertraline , fluoxetine, venlafaxine , xanax, clonazepam , temazepam , Ambien   The patient demonstrates the following risk factors for suicide: Chronic risk factors for suicide include: psychiatric disorder of depression. Acute risk factors for suicide include: N/A. Protective factors for this patient include: coping skills and hope for the future. Considering these factors, the overall suicide risk at this point appears to be low. Patient is appropriate for outpatient follow up.  Collaboration of Care: Collaboration of Care: {BH OP Collaboration of Care:21014065}  Patient/Guardian was advised Release of Information must be obtained prior to any record release in order to collaborate their care with an outside provider. Patient/Guardian was advised if they have not already done so to contact the registration department to sign all necessary forms in order for us  to release information regarding their care.   Consent: Patient/Guardian gives verbal consent for  treatment and assignment of benefits for services provided during this visit. Patient/Guardian expressed understanding and agreed to proceed.    Katheren Sleet, MD 08/17/2023, 10:40 AM

## 2023-08-21 ENCOUNTER — Telehealth: Admitting: Psychiatry

## 2023-09-18 NOTE — Progress Notes (Signed)
 BH MD/PA/NP OP Progress Note  09/22/2023 5:14 PM [name]  MRN:  [id number]  Chief Complaint:  Chief Complaint  Patient presents with   Follow-up   HPI:  This is a follow-up appointment for depression, anxiety and insomnia. She has not seen since last March.  She states that she has been doing okay.  She cannot tell any difference since being back on the Effexor , although she thinks she has been doing better compared to the time she was on Trintellix .  She has been a stay inside due to the heat, and does gardening later in the day.  She was able to get her appointment done.  She is planning to go to the beach with her husband.  She is trying to get the swimming suit.  She agrees that she may also try going to the [facility] as she has difficulty walking due to knee pain.  She states that it is difficult to accept she may need to have knee replacement.  Her dog has been doing better.  Although there was a concern about the mass, they did not find any concern about cancer. The patient has mood symptoms as in PHQ-9/GAD-7.  She sleeps fair.  She wishes to lose weight.  She reports occasional anxiety.  She shares an example of her feeling very overwhelmed when she had issues with HOA payment. She denies panic attacks. She denies SI, HI, hallucinations.  She denies alcohol use or drug use.  She states that she has not been taking medication consistently.  She may miss to take it a few times per week.  She agrees with the plans as outlined below.     Wt Readings from Last 3 Encounters:  09/22/23 176 lb 3.2 oz (79.9 kg)  05/13/23 176 lb 9.6 oz (80.1 kg)  02/20/23 178 lb (80.7 kg)     Employment: unemployed, used to work at [employer]: husband Marital status: married since 2008 Number of children:0   Visit Diagnosis:    ICD-10-CM   1. MDD (major depressive disorder), recurrent episode, mild (HCC)  F33.0     2. Insomnia, unspecified type  G47.00       Past Psychiatric History:  Please see initial evaluation for full details. I have reviewed the history. No updates at this time.     Past Medical History:  Past Medical History:  Diagnosis Date   Anxiety    Depression    GERD (gastroesophageal reflux disease)    Hypertension    Meralgia paresthetica of right side 06/05/2017    Past Surgical History:  Procedure Laterality Date   KNEE CARTILAGE SURGERY Right    Workers Compensation, fell out of chair at work.     Family Psychiatric History: Please see initial evaluation for full details. I have reviewed the history. No updates at this time.     Family History:  Family History  Problem Relation Age of Onset   Alzheimer's disease Mother    Hypertension Sister    Hypertension Sister    Diabetes Maternal Uncle     Social History:  Social History   Socioeconomic History   Marital status: Married    Spouse name: Not on file   Number of children: Not on file   Years of education: Not on file   Highest education level: Bachelor's degree (e.g., BA, AB, BS)  Occupational History   Not on file  Tobacco Use   Smoking status: Former    Current packs/day: 0.00  Types: Cigarettes    Quit date: 10/13/1983    Years since quitting: 39.9   Smokeless tobacco: Never  Substance and Sexual Activity   Alcohol use: No   Drug use: No   Sexual activity: Not Currently    Birth control/protection: Post-menopausal  Other Topics Concern   Not on file  Social History Narrative   Lives   Caffeine use:    Married. 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    Spends every 3rd night at [employer].    Has been taking care of mother since 37.    Social Drivers of Corporate investment banker Strain: Not on file  Food Insecurity: Not on file  Transportation Needs: Not on file  Physical Activity: Not on file  Stress: Not on file  Social Connections: Not on file    Allergies: No Known Allergies  Metabolic Disorder Labs: Lab Results  Component  Value Date   HGBA1C 5.5 11/06/2016   HGBA1C 5.5 11/06/2016   MPG 111 11/06/2016   No results found for: PROLACTIN Lab Results  Component Value Date   CHOL 149 10/30/2017   TRIG 73 10/30/2017   HDL 48 (L) 10/30/2017   CHOLHDL 3.1 10/30/2017   LDLCALC 85 10/30/2017   LDLCALC 189 (H) 07/10/2017   Lab Results  Component Value Date   TSH 0.98 10/30/2017   TSH 1.23 11/06/2016   TSH 1.23 11/06/2016    Therapeutic Level Labs: No results found for: LITHIUM No results found for: VALPROATE Lab Results  Component Value Date   CBMZ 7.8 07/29/2017    Current Medications: Current Outpatient Medications  Medication Sig Dispense Refill   busPIRone  (BUSPAR ) 5 MG tablet Take 1 tablet (5 mg total) by mouth 2 (two) times daily. 180 tablet 0   aspirin EC 81 MG tablet Take 81 mg by mouth daily.     atorvastatin  (LIPITOR) 20 MG tablet Take 1 tablet (20 mg total) by mouth daily. 90 tablet 3   Biotin  5 MG TABS Use one po qd  0   buPROPion  (WELLBUTRIN  XL) 150 MG 24 hr tablet Take 1 tablet (150 mg total) by mouth daily. Total of 450 mg daily, along with 300 mg tab 90 tablet 1   buPROPion  (WELLBUTRIN  XL) 300 MG 24 hr tablet Take 1 tablet (300 mg total) by mouth daily. Take total of 450 mg daily, take along with 150 mg tab 90 tablet 1   chlorhexidine (PERIDEX) 0.12 % solution 5 mLs by Mouth Rinse route 2 (two) times daily.     Cholecalciferol 25 MCG (1000 UT) capsule Take 4,000 Units by mouth daily.     gabapentin  (NEURONTIN ) 300 MG capsule TAKE TWO (2) CAPSULES BY MOUTH THREE TIMES DAILY. 180 capsule 1   hydrochlorothiazide  (HYDRODIURIL ) 25 MG tablet Take 1 tablet (25 mg total) by mouth daily. 90 tablet 1   HYDROcodone -acetaminophen  (NORCO/VICODIN) 5-325 MG tablet Take 1-2 tablets by mouth every 6 (six) hours as needed for moderate pain (pain score 4-6). 30 tablet 0   meloxicam  (MOBIC ) 15 MG tablet Take 1 tablet (15 mg total) by mouth daily. 30 tablet 3   metoprolol  succinate (TOPROL -XL) 50 MG  24 hr tablet Take 1 tablet (50 mg total) by mouth daily. Take with or immediately following a meal. 90 tablet 0   Multiple Vitamin (MULTIVITAMIN) capsule Take 1 capsule by mouth daily.     traZODone  (DESYREL ) 150 MG tablet Take 1 tablet (150 mg total) by mouth at bedtime as  needed for sleep. 90 tablet 1   venlafaxine  XR (EFFEXOR -XR) 150 MG 24 hr capsule Take 1 capsule (150 mg total) by mouth daily. Start 75 mg daily for one week, then 150 mg daily for one week, then 225 mg daily. Take along with 75 mg cap 90 capsule 0   venlafaxine  XR (EFFEXOR -XR) 75 MG 24 hr capsule Take 1 capsule (75 mg total) by mouth daily with breakfast. Start 75 mg daily for one week, then 150 mg daily for one week, then 225 mg daily. Take along with 150 mg cap 90 capsule 0   No current facility-administered medications for this visit.     Musculoskeletal: Strength & Muscle Tone: within normal limits Gait & Station: normal Patient leans: N/A  Psychiatric Specialty Exam: Review of Systems  Psychiatric/Behavioral:  Positive for dysphoric mood. Negative for agitation, behavioral problems, confusion, decreased concentration, hallucinations, self-injury, sleep disturbance and suicidal ideas. The patient is nervous/anxious. The patient is not hyperactive.   All other systems reviewed and are negative.   Blood pressure 134/82, pulse (!) 55, temperature (!) 97.5 F (36.4 C), temperature source Temporal, height 5' (1.524 m), weight 176 lb 3.2 oz (79.9 kg).Body mass index is 34.41 kg/m.  General Appearance: Well Groomed  Eye Contact:  Good  Speech:  Clear and Coherent  Volume:  Normal  Mood:  fine  Affect:  Appropriate, Congruent, and calm  Thought Process:  Coherent  Orientation:  Full (Time, Place, and Person)  Thought Content: Logical   Suicidal Thoughts:  No  Homicidal Thoughts:  No  Memory:  Immediate;   Good  Judgement:  Good  Insight:  Good  Psychomotor Activity:  Normal  Concentration:  Concentration: Good  and Attention Span: Good  Recall:  Good  Fund of Knowledge: Good  Language: Good  Akathisia:  No  Handed:  Right  AIMS (if indicated): not done  Assets:  Communication Skills Desire for Improvement  ADL's:  Intact  Cognition: WNL  Sleep:  Good   Screenings: GAD-7    Flowsheet Row Office Visit from 09/22/2023 in [facility] Phone Follow Up from 10/29/2017 in [facility] Phone Follow Up from 09/25/2017 in [facility] Phone Follow Up from 05/28/2017 in [facility]  Total GAD-7 Score 6 12 17 14    PHQ2-9    Flowsheet Row Office Visit from 09/22/2023 in [facility] Office Visit from 08/05/2022 in [facility] Office Visit from 05/17/2021 in [facility] Video Visit from 10/10/2020 in [facility] Video Visit from 07/11/2020 in [facility]  PHQ-2 Total Score 2 2 2 1 1   PHQ-9 Total Score 5 6 4  -- --   Flowsheet Row Video Visit from 10/10/2020 in [facility] Video Visit from 04/13/2020 in [facility] Virtual [facility] Phone Follow Up from 10/29/2017 in [facility]  C-SSRS RISK CATEGORY No Risk No Risk No Risk     Assessment and Plan:  [name] is a 68 y.o. year old female with a history of depression,meralgia paresthetica, hyperlipidemia, who presents for follow up appointment for below.   1. MDD (major depressive disorder), recurrent episode, mild (HCC) Other stressors include: loss of her mother in Dec 2022, sister in Dec 2023, uncle in June  2024 History: struggling with depression for many years. Not interested in therapy, feeling pressured to  accomplish goal    Although there has been overall improvement in behavioral activation, she continues to experience occasional depressive symptoms and anxiety since the last visit.  There is concern of medication adherence.   she is willing to take it consistently before adjusting her medication.  Will continue venlafaxine  to target depression and anxiety.  Will continue bupropion  as adjunctive treatment for depression, and BuSpar  for anxiety.   2. Insomnia, unspecified type Overall improving.  Will continue trazodone  as needed for insomnia.    Plan Continue venlafaxine  225 mg daily  Continue buspar  5 mg twice day  Continue  bupropion  450 mg daily  Continue Trazodone  150 mg at night  Next appointment:  9/16 at 2:30, IP - on gabapentin  600 mg three times a day   Past trials of medication: sertraline , fluoxetine, venlafaxine , xanax, clonazepam , temazepam , Ambien   The patient demonstrates the following risk factors for suicide: Chronic risk factors for suicide include: psychiatric disorder of depression. Acute risk factors for suicide include: N/A. Protective factors for this patient include: coping skills and hope for the future. Considering these factors, the overall suicide risk at this point appears to be low. Patient is appropriate for outpatient follow up.  Collaboration of Care: Collaboration of Care: Other reviewed notes in Epic  Patient/Guardian was advised Release of Information must be obtained prior to any record release in order to collaborate their care with an outside provider. Patient/Guardian was advised if they have not already done so to contact the registration department to sign all necessary forms in order for us  to release information regarding their care.   Consent: Patient/Guardian gives verbal consent for treatment and assignment of benefits for services provided during this visit. Patient/Guardian expressed understanding and agreed to proceed.    Katheren Sleet,  MD 09/22/2023, 5:14 PM

## 2023-09-22 ENCOUNTER — Ambulatory Visit: Admitting: Psychiatry

## 2023-09-22 ENCOUNTER — Other Ambulatory Visit: Payer: Self-pay

## 2023-09-22 ENCOUNTER — Encounter: Payer: Self-pay | Admitting: Psychiatry

## 2023-09-22 VITALS — BP 134/82 | HR 55 | Temp 97.5°F | Ht 60.0 in | Wt 176.2 lb

## 2023-09-22 DIAGNOSIS — G4700 Insomnia, unspecified: Secondary | ICD-10-CM

## 2023-09-22 DIAGNOSIS — F330 Major depressive disorder, recurrent, mild: Secondary | ICD-10-CM

## 2023-09-22 MED ORDER — BUSPIRONE HCL 5 MG PO TABS: 5.0000 mg | ORAL_TABLET | Freq: Two times a day (BID) | ORAL | 0 refills | Status: AC

## 2023-09-22 MED ORDER — VENLAFAXINE HCL ER 150 MG PO CP24
150.0000 mg | ORAL_CAPSULE | Freq: Every day | ORAL | 0 refills | Status: DC
Start: 2023-09-22 — End: 2023-12-24

## 2023-09-22 MED ORDER — VENLAFAXINE HCL ER 75 MG PO CP24: 75.0000 mg | ORAL_CAPSULE | Freq: Every day | ORAL | 0 refills | Status: DC

## 2023-09-22 MED ORDER — BUPROPION HCL ER (XL) 300 MG PO TB24: 300.0000 mg | ORAL_TABLET | Freq: Every day | ORAL | 1 refills | Status: AC

## 2023-09-22 MED ORDER — BUPROPION HCL ER (XL) 150 MG PO TB24: 150.0000 mg | ORAL_TABLET | Freq: Every day | ORAL | 1 refills | Status: AC

## 2023-09-22 NOTE — Patient Instructions (Signed)
 Continue venlafaxine  225 mg daily  Continue buspar  5 mg twice day  Continue  bupropion  450 mg daily  Continue Trazodone  150 mg at night  Next appointment:  9/16 at 2:30

## 2023-09-23 ENCOUNTER — Telehealth: Payer: Self-pay

## 2023-09-23 NOTE — Telephone Encounter (Signed)
 pharamcy was faxed and confirmed the approval notice.

## 2023-09-23 NOTE — Telephone Encounter (Signed)
 prior auth was submited and approved from 1-1-5 to 02-18-24

## 2023-09-23 NOTE — Telephone Encounter (Signed)
 received notice from [email or web address]/ [email or web address] that a prior auth was needed for the venlafaxine 

## 2023-11-04 ENCOUNTER — Ambulatory Visit: Admitting: Psychiatry

## 2023-12-22 ENCOUNTER — Other Ambulatory Visit (HOSPITAL_COMMUNITY): Payer: Self-pay | Admitting: Family Medicine

## 2023-12-22 ENCOUNTER — Encounter: Payer: Self-pay | Admitting: Radiology

## 2023-12-22 DIAGNOSIS — Z1231 Encounter for screening mammogram for malignant neoplasm of breast: Secondary | ICD-10-CM

## 2023-12-22 NOTE — Progress Notes (Deleted)
 BH MD/PA/NP OP Progress Note  12/22/2023 9:03 AM [name]  MRN:  [id number]  Chief Complaint: No chief complaint on file.  HPI: ***  Employment: unemployed, used to work at [employer]: husband Marital status: married since 2008 Number of children:0   Visit Diagnosis: No diagnosis found.  Past Psychiatric History: Please see initial evaluation for full details. I have reviewed the history. No updates at this time.     Past Medical History:  Past Medical History:  Diagnosis Date   Anxiety    Depression    GERD (gastroesophageal reflux disease)    Hypertension    Meralgia paresthetica of right side 06/05/2017    Past Surgical History:  Procedure Laterality Date   KNEE CARTILAGE SURGERY Right    Workers Compensation, fell out of chair at work.     Family Psychiatric History: Please see initial evaluation for full details. I have reviewed the history. No updates at this time.    Family History:  Family History  Problem Relation Age of Onset   Alzheimer's disease Mother    Hypertension Sister    Hypertension Sister    Diabetes Maternal Uncle     Social History:  Social History   Socioeconomic History   Marital status: Married    Spouse name: Not on file   Number of children: Not on file   Years of education: Not on file   Highest education level: Bachelor's degree (e.g., BA, AB, BS)  Occupational History   Not on file  Tobacco Use   Smoking status: Former    Current packs/day: 0.00    Types: Cigarettes    Quit date: 10/13/1983    Years since quitting: 40.2   Smokeless tobacco: Never  Substance and Sexual Activity   Alcohol use: No   Drug use: No   Sexual activity: Not Currently    Birth control/protection: Post-menopausal  Other Topics Concern   Not on file  Social History Narrative   Lives   Caffeine use:    Married. 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    Spends every 3rd night at [employer].    Has been taking  care of mother since 13.    Social Drivers of Corporate Investment Banker Strain: Not on file  Food Insecurity: Not on file  Transportation Needs: Not on file  Physical Activity: Not on file  Stress: Not on file  Social Connections: Not on file    Allergies: No Known Allergies  Metabolic Disorder Labs: Lab Results  Component Value Date   HGBA1C 5.5 11/06/2016   HGBA1C 5.5 11/06/2016   MPG 111 11/06/2016   No results found for: PROLACTIN Lab Results  Component Value Date   CHOL 149 10/30/2017   TRIG 73 10/30/2017   HDL 48 (L) 10/30/2017   CHOLHDL 3.1 10/30/2017   LDLCALC 85 10/30/2017   LDLCALC 189 (H) 07/10/2017   Lab Results  Component Value Date   TSH 0.98 10/30/2017   TSH 1.23 11/06/2016   TSH 1.23 11/06/2016    Therapeutic Level Labs: No results found for: LITHIUM No results found for: VALPROATE Lab Results  Component Value Date   CBMZ 7.8 07/29/2017    Current Medications: Current Outpatient Medications  Medication Sig Dispense Refill   aspirin EC 81 MG tablet Take 81 mg by mouth daily.     atorvastatin  (LIPITOR) 20 MG tablet Take 1 tablet (20 mg total) by mouth daily. 90 tablet 3   Biotin   5 MG TABS Use one po qd  0   buPROPion  (WELLBUTRIN  XL) 150 MG 24 hr tablet Take 1 tablet (150 mg total) by mouth daily. Total of 450 mg daily, along with 300 mg tab 90 tablet 1   buPROPion  (WELLBUTRIN  XL) 300 MG 24 hr tablet Take 1 tablet (300 mg total) by mouth daily. Take total of 450 mg daily, take along with 150 mg tab 90 tablet 1   chlorhexidine (PERIDEX) 0.12 % solution 5 mLs by Mouth Rinse route 2 (two) times daily.     Cholecalciferol 25 MCG (1000 UT) capsule Take 4,000 Units by mouth daily.     gabapentin  (NEURONTIN ) 300 MG capsule TAKE TWO (2) CAPSULES BY MOUTH THREE TIMES DAILY. 180 capsule 1   hydrochlorothiazide  (HYDRODIURIL ) 25 MG tablet Take 1 tablet (25 mg total) by mouth daily. 90 tablet 1   HYDROcodone -acetaminophen  (NORCO/VICODIN) 5-325 MG  tablet Take 1-2 tablets by mouth every 6 (six) hours as needed for moderate pain (pain score 4-6). 30 tablet 0   meloxicam  (MOBIC ) 15 MG tablet Take 1 tablet (15 mg total) by mouth daily. 30 tablet 3   metoprolol  succinate (TOPROL -XL) 50 MG 24 hr tablet Take 1 tablet (50 mg total) by mouth daily. Take with or immediately following a meal. 90 tablet 0   Multiple Vitamin (MULTIVITAMIN) capsule Take 1 capsule by mouth daily.     traZODone  (DESYREL ) 150 MG tablet Take 1 tablet (150 mg total) by mouth at bedtime as needed for sleep. 90 tablet 1   venlafaxine  XR (EFFEXOR -XR) 150 MG 24 hr capsule Take 1 capsule (150 mg total) by mouth daily. Start 75 mg daily for one week, then 150 mg daily for one week, then 225 mg daily. Take along with 75 mg cap 90 capsule 0   venlafaxine  XR (EFFEXOR -XR) 75 MG 24 hr capsule Take 1 capsule (75 mg total) by mouth daily with breakfast. Start 75 mg daily for one week, then 150 mg daily for one week, then 225 mg daily. Take along with 150 mg cap 90 capsule 0   No current facility-administered medications for this visit.     Musculoskeletal: Strength & Muscle Tone: within normal limits Gait & Station: normal Patient leans: N/A  Psychiatric Specialty Exam: Review of Systems  There were no vitals taken for this visit.There is no height or weight on file to calculate BMI.  General Appearance: {Appearance:22683}  Eye Contact:  {BHH EYE CONTACT:22684}  Speech:  Clear and Coherent  Volume:  Normal  Mood:  {BHH MOOD:22306}  Affect:  {Affect (PAA):22687}  Thought Process:  Coherent  Orientation:  Full (Time, Place, and Person)  Thought Content: Logical   Suicidal Thoughts:  {ST/HT (PAA):22692}  Homicidal Thoughts:  {ST/HT (PAA):22692}  Memory:  Immediate;   Good  Judgement:  {Judgement (PAA):22694}  Insight:  {Insight (PAA):22695}  Psychomotor Activity:  Normal  Concentration:  Concentration: Good and Attention Span: Good  Recall:  Good  Fund of Knowledge: Good   Language: Good  Akathisia:  No  Handed:  Right  AIMS (if indicated): not done  Assets:  Communication Skills Desire for Improvement  ADL's:  Intact  Cognition: WNL  Sleep:  {BHH GOOD/FAIR/POOR:22877}   Screenings: GAD-7    Loss Adjuster, Chartered Office Visit from 09/22/2023 in [facility] Phone Follow Up from 10/29/2017 in [facility] Phone Follow Up from 09/25/2017 in [facility] Phone Follow Up  from 05/28/2017 in [facility]  Total GAD-7 Score 6 12 17 14    PHQ2-9    Flowsheet Row Office Visit from 09/22/2023 in [facility] Office Visit from 08/05/2022 in [facility] Office Visit from 05/17/2021 in [facility] Video Visit from 10/10/2020 in [facility] Video Visit from 07/11/2020 in [facility]  PHQ-2 Total Score 2 2 2 1 1   PHQ-9 Total Score 5 6 4  -- --   Flowsheet Row Video Visit from 10/10/2020 in [facility] Video Visit from 04/13/2020 in [facility] Phone Follow Up from 10/29/2017 in [facility]  C-SSRS RISK CATEGORY No Risk No Risk No Risk     Assessment and Plan:  [name] is a 68 y.o. year old female with a history of depression,meralgia paresthetica, hyperlipidemia, who presents for follow up appointment for below.    1. MDD (major depressive disorder), recurrent episode, mild (HCC) Other stressors include: loss of her mother in Dec 2022, sister in Dec 2023, uncle in June 2024 History: struggling with depression for many years. Not interested in therapy, feeling pressured to accomplish goal    Although there has been  overall improvement in behavioral activation, she continues to experience occasional depressive symptoms and anxiety since the last visit.  There is concern of medication adherence.   she is willing to take it consistently before adjusting her medication.  Will continue venlafaxine  to target depression and anxiety.  Will continue bupropion  as adjunctive treatment for depression, and BuSpar  for anxiety.    2. Insomnia, unspecified type Overall improving.  Will continue trazodone  as needed for insomnia.   Plan Continue venlafaxine  225 mg daily  Continue buspar  5 mg twice day  Continue  bupropion  450 mg daily  Continue Trazodone  150 mg at night  Next appointment:  9/16 at 2:30, IP - on gabapentin  600 mg three times a day   Past trials of medication: sertraline , fluoxetine, venlafaxine , xanax, clonazepam , temazepam , Ambien   The patient demonstrates the following risk factors for suicide: Chronic risk factors for suicide include: psychiatric disorder of depression. Acute risk factors for suicide include: N/A. Protective factors for this patient include: coping skills and hope for the future. Considering these factors, the overall suicide risk at this point appears to be low. Patient is appropriate for outpatient follow up.  Collaboration of Care: Collaboration of Care: {BH OP Collaboration of Care:21014065}  Patient/Guardian was advised Release of Information must be obtained prior to any record release in order to collaborate their care with an outside provider. Patient/Guardian was advised if they have not already done so to contact the registration department to sign all necessary forms in order for us  to release information regarding their care.   Consent: Patient/Guardian gives verbal consent for treatment and assignment of benefits for services provided during this visit. Patient/Guardian expressed understanding and agreed to proceed.    Katheren Sleet, MD 12/22/2023, 9:03 AM

## 2023-12-24 ENCOUNTER — Telehealth (INDEPENDENT_AMBULATORY_CARE_PROVIDER_SITE_OTHER): Admitting: Psychiatry

## 2023-12-24 ENCOUNTER — Encounter: Payer: Self-pay | Admitting: Psychiatry

## 2023-12-24 DIAGNOSIS — G4700 Insomnia, unspecified: Secondary | ICD-10-CM

## 2023-12-24 DIAGNOSIS — F330 Major depressive disorder, recurrent, mild: Secondary | ICD-10-CM

## 2023-12-24 MED ORDER — TRAZODONE HCL 150 MG PO TABS: 150.0000 mg | ORAL_TABLET | Freq: Every evening | ORAL | 1 refills | Status: AC | PRN

## 2023-12-24 MED ORDER — VENLAFAXINE HCL ER 75 MG PO CP24: 75.0000 mg | ORAL_CAPSULE | Freq: Every day | ORAL | 1 refills | Status: AC

## 2023-12-24 MED ORDER — VENLAFAXINE HCL ER 150 MG PO CP24: 150.0000 mg | ORAL_CAPSULE | Freq: Every day | ORAL | 1 refills | Status: AC

## 2023-12-24 NOTE — Progress Notes (Signed)
 Virtual Visit via Video Note  I connected with [name] on 12/24/23 at  1:20 PM EST by a video enabled telemedicine application and verified that I am speaking with the correct person using two identifiers.  Location: Patient: home Provider: home office Persons participated in the visit- patient, provider    I discussed the limitations of evaluation and management by telemedicine and the availability of in person appointments. The patient expressed understanding and agreed to proceed.    I discussed the assessment and treatment plan with the patient. The patient was provided an opportunity to ask questions and all were answered. The patient agreed with the plan and demonstrated an understanding of the instructions.   The patient was advised to call back or seek an in-person evaluation if the symptoms worsen or if the condition fails to improve as anticipated.   Renee Sleet, MD    [facility] MD/PA/NP OP Progress Note  12/24/2023 1:42 PM [name]  MRN:  [id number]  Chief Complaint:  Chief Complaint  Patient presents with   Follow-up   HPI:  This is a follow-up appointment for depression, insomnia.  She states that she occasionally feels depressed, thinking about the loved ones, who passed away.  She reports most of her family in the same month.  She tries to live at the present moment each day.  She has been trying to keep herself busy doing things.  Although she has not been able to take a walk due to knee pain, she has been working on household work.  Although she hopes to do chair exercises, she has not been able to get it started yet.  She is willing to try even only once.  She has fair sleep.  She takes trazodone  only occasionally for insomnia.  She denies anxiety.  She denies panic attacks.  She denies SI, HI, hallucinations.  She agrees with the plans as outlined below.   Visit Diagnosis:    ICD-10-CM   1. MDD (major depressive disorder), recurrent episode, mild  F33.0      2. Insomnia, unspecified type  G47.00 traZODone  (DESYREL ) 150 MG tablet      Past Psychiatric History: Please see initial evaluation for full details. I have reviewed the history. No updates at this time.     Past Medical History:  Past Medical History:  Diagnosis Date   Anxiety    Depression    GERD (gastroesophageal reflux disease)    Hypertension    Meralgia paresthetica of right side 06/05/2017    Past Surgical History:  Procedure Laterality Date   KNEE CARTILAGE SURGERY Right    Workers Compensation, fell out of chair at work.     Family Psychiatric History: Please see initial evaluation for full details. I have reviewed the history. No updates at this time.     Family History:  Family History  Problem Relation Age of Onset   Alzheimer's disease Mother    Hypertension Sister    Hypertension Sister    Diabetes Maternal Uncle     Social History:  Social History   Socioeconomic History   Marital status: Married    Spouse name: Not on file   Number of children: Not on file   Years of education: Not on file   Highest education level: Bachelor's degree (e.g., BA, AB, BS)  Occupational History   Not on file  Tobacco Use   Smoking status: Former    Current packs/day: 0.00    Types: Cigarettes  Quit date: 10/13/1983    Years since quitting: 40.2   Smokeless tobacco: Never  Substance and Sexual Activity   Alcohol use: No   Drug use: No   Sexual activity: Not Currently    Birth control/protection: Post-menopausal  Other Topics Concern   Not on file  Social History Narrative   Lives   Caffeine use:    Married. 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    Spends every 3rd night at [employer].    Has been taking care of mother since 71.    Social Drivers of Corporate Investment Banker Strain: Not on file  Food Insecurity: Not on file  Transportation Needs: Not on file  Physical Activity: Not on file  Stress: Not on file  Social  Connections: Not on file    Allergies: No Known Allergies  Metabolic Disorder Labs: Lab Results  Component Value Date   HGBA1C 5.5 11/06/2016   HGBA1C 5.5 11/06/2016   MPG 111 11/06/2016   No results found for: PROLACTIN Lab Results  Component Value Date   CHOL 149 10/30/2017   TRIG 73 10/30/2017   HDL 48 (L) 10/30/2017   CHOLHDL 3.1 10/30/2017   LDLCALC 85 10/30/2017   LDLCALC 189 (H) 07/10/2017   Lab Results  Component Value Date   TSH 0.98 10/30/2017   TSH 1.23 11/06/2016   TSH 1.23 11/06/2016    Therapeutic Level Labs: No results found for: LITHIUM No results found for: VALPROATE Lab Results  Component Value Date   CBMZ 7.8 07/29/2017    Current Medications: Current Outpatient Medications  Medication Sig Dispense Refill   chlorthalidone (HYGROTON) 25 MG tablet Take 25 mg by mouth daily.     aspirin EC 81 MG tablet Take 81 mg by mouth daily.     atorvastatin  (LIPITOR) 20 MG tablet Take 1 tablet (20 mg total) by mouth daily. 90 tablet 3   Biotin  5 MG TABS Use one po qd  0   buPROPion  (WELLBUTRIN  XL) 150 MG 24 hr tablet Take 1 tablet (150 mg total) by mouth daily. Total of 450 mg daily, along with 300 mg tab 90 tablet 1   buPROPion  (WELLBUTRIN  XL) 300 MG 24 hr tablet Take 1 tablet (300 mg total) by mouth daily. Take total of 450 mg daily, take along with 150 mg tab 90 tablet 1   chlorhexidine (PERIDEX) 0.12 % solution 5 mLs by Mouth Rinse route 2 (two) times daily.     Cholecalciferol 25 MCG (1000 UT) capsule Take 4,000 Units by mouth daily.     gabapentin  (NEURONTIN ) 300 MG capsule TAKE TWO (2) CAPSULES BY MOUTH THREE TIMES DAILY. 180 capsule 1   hydrochlorothiazide  (HYDRODIURIL ) 25 MG tablet Take 1 tablet (25 mg total) by mouth daily. (Patient not taking: Reported on 12/24/2023) 90 tablet 1   HYDROcodone -acetaminophen  (NORCO/VICODIN) 5-325 MG tablet Take 1-2 tablets by mouth every 6 (six) hours as needed for moderate pain (pain score 4-6). 30 tablet 0    meloxicam  (MOBIC ) 15 MG tablet Take 1 tablet (15 mg total) by mouth daily. 30 tablet 3   metoprolol  succinate (TOPROL -XL) 50 MG 24 hr tablet Take 1 tablet (50 mg total) by mouth daily. Take with or immediately following a meal. 90 tablet 0   Multiple Vitamin (MULTIVITAMIN) capsule Take 1 capsule by mouth daily.     traZODone  (DESYREL ) 150 MG tablet Take 1 tablet (150 mg total) by mouth at bedtime as needed for sleep. 90 tablet 1  venlafaxine  XR (EFFEXOR -XR) 150 MG 24 hr capsule Take 1 capsule (150 mg total) by mouth daily. Start 75 mg daily for one week, then 150 mg daily for one week, then 225 mg daily. Take along with 75 mg cap 90 capsule 1   venlafaxine  XR (EFFEXOR -XR) 75 MG 24 hr capsule Take 1 capsule (75 mg total) by mouth daily with breakfast. Total of 225 mg daily. Take along with 150 mg cap 90 capsule 1   No current facility-administered medications for this visit.     Musculoskeletal: Strength & Muscle Tone: N/A Gait & Station: N/A Patient leans: N/A  Psychiatric Specialty Exam: Review of Systems  Psychiatric/Behavioral:  Positive for dysphoric mood. Negative for agitation, behavioral problems, confusion, decreased concentration, hallucinations, self-injury, sleep disturbance and suicidal ideas. The patient is not nervous/anxious and is not hyperactive.   All other systems reviewed and are negative.   There were no vitals taken for this visit.There is no height or weight on file to calculate BMI.  General Appearance: Well Groomed  Eye Contact:  Good  Speech:  Clear and Coherent  Volume:  Normal  Mood:  okay  Affect:  Appropriate, Congruent, and calm  Thought Process:  Coherent  Orientation:  Full (Time, Place, and Person)  Thought Content: Logical   Suicidal Thoughts:  No  Homicidal Thoughts:  No  Memory:  Immediate;   Good  Judgement:  Good  Insight:  Good  Psychomotor Activity:  Normal  Concentration:  Concentration: Good and Attention Span: Good  Recall:  Good   Fund of Knowledge: Good  Language: Good  Akathisia:  No  Handed:  Right  AIMS (if indicated): not done  Assets:  Communication Skills Desire for Improvement  ADL's:  Intact  Cognition: WNL  Sleep:  Good   Screenings: GAD-7    Flowsheet Row Office Visit from 09/22/2023 in [facility] Phone Follow Up from 10/29/2017 in [facility] Phone Follow Up from 09/25/2017 in [facility] Phone Follow Up from 05/28/2017 in [facility]  Total GAD-7 Score 6 12 17 14    PHQ2-9    Flowsheet Row Office Visit from 09/22/2023 in [facility] Office Visit from 08/05/2022 in [facility] Office Visit from 05/17/2021 in [facility] Video Visit from 10/10/2020 in [facility] Video Visit from 07/11/2020 in [facility]  PHQ-2 Total Score 2 2 2 1 1   PHQ-9 Total Score 5 6 4  -- --   Flowsheet Row Video Visit from 10/10/2020 in [facility] Video Visit from 04/13/2020 in [facility] Phone Follow Up from 10/29/2017 in [facility]  C-SSRS RISK CATEGORY No Risk No Risk No Risk     Assessment and Plan:  Evamaria Detore is a 68 y.o. year old female with a history of depression,meralgia paresthetica, hyperlipidemia, who presents for follow up appointment for below.   2. MDD (major depressive disorder), recurrent episode, mild Other stressors include: loss of her mother in Dec 2022, sister in Dec 2023, uncle in June 2024 History: struggling with depression for many years. Not interested in therapy, feeling pressured to accomplish goal     Although she reports occasional  down mood, she has been able to stay active since her  last visit.  Will continue current dose of venlafaxine  to target depression and anxiety.  Will continue bupropion  as adjunctive treatment for depression, and BuSpar  for anxiety.   1. Insomnia, unspecified type Overall improving.  Will continue trazodone  as needed for insomnia.   Plan Continue venlafaxine  225 mg daily  Continue buspar  5 mg twice day  Continue  bupropion  450 mg daily  Continue Trazodone  150 mg at night  Next appointment:  1/16 at 9 am, video - on gabapentin  600 mg three times a day   Past trials of medication: sertraline , fluoxetine, venlafaxine , xanax, clonazepam , temazepam , Ambien   The patient demonstrates the following risk factors for suicide: Chronic risk factors for suicide include: psychiatric disorder of depression. Acute risk factors for suicide include: N/A. Protective factors for this patient include: coping skills and hope for the future. Considering these factors, the overall suicide risk at this point appears to be low. Patient is appropriate for outpatient follow up.  Collaboration of Care: Collaboration of Care: Other reviewed notes in Epic  Patient/Guardian was advised Release of Information must be obtained prior to any record release in order to collaborate their care with an outside provider. Patient/Guardian was advised if they have not already done so to contact the registration department to sign all necessary forms in order for us  to release information regarding their care.   Consent: Patient/Guardian gives verbal consent for treatment and assignment of benefits for services provided during this visit. Patient/Guardian expressed understanding and agreed to proceed.    Renee Sleet, MD 12/24/2023, 1:42 PM

## 2023-12-24 NOTE — Patient Instructions (Signed)
 Continue venlafaxine  225 mg daily  Continue buspar  5 mg twice day  Continue  bupropion  450 mg daily  Continue Trazodone  150 mg at night  Next appointment:  1/16 at 9 am

## 2023-12-25 ENCOUNTER — Ambulatory Visit: Admitting: Psychiatry

## 2023-12-29 ENCOUNTER — Encounter (HOSPITAL_COMMUNITY): Payer: Self-pay

## 2023-12-29 ENCOUNTER — Ambulatory Visit (HOSPITAL_COMMUNITY)
Admission: RE | Admit: 2023-12-29 | Discharge: 2023-12-29 | Disposition: A | Discharge status: Home / Self Care | Source: Ambulatory Visit | Attending: Family Medicine | Admitting: Family Medicine

## 2023-12-29 DIAGNOSIS — Z1231 Encounter for screening mammogram for malignant neoplasm of breast: Secondary | ICD-10-CM | POA: Insufficient documentation

## 2024-02-29 NOTE — Progress Notes (Unsigned)
 BH MD/PA/NP OP Progress Note  02/29/2024 11:18 AM [name]  MRN:  [id number]  Chief Complaint: No chief complaint on file.  HPI: *** Visit Diagnosis: No diagnosis found.  Past Psychiatric History: Please see initial evaluation for full details. I have reviewed the history. No updates at this time.     Past Medical History:  Past Medical History:  Diagnosis Date   Anxiety    Depression    GERD (gastroesophageal reflux disease)    Hypertension    Meralgia paresthetica of right side 06/05/2017    Past Surgical History:  Procedure Laterality Date   KNEE CARTILAGE SURGERY Right    Workers Compensation, fell out of chair at work.     Family Psychiatric History: Please see initial evaluation for full details. I have reviewed the history. No updates at this time.     Family History:  Family History  Problem Relation Age of Onset   Alzheimer's disease Mother    Hypertension Sister    Hypertension Sister    Diabetes Maternal Uncle     Social History:  Social History   Socioeconomic History   Marital status: Married    Spouse name: Not on file   Number of children: Not on file   Years of education: Not on file   Highest education level: Bachelor's degree (e.g., BA, AB, BS)  Occupational History   Not on file  Tobacco Use   Smoking status: Former    Current packs/day: 0.00    Types: Cigarettes    Quit date: 10/13/1983    Years since quitting: 40.4   Smokeless tobacco: Never  Substance and Sexual Activity   Alcohol use: No   Drug use: No   Sexual activity: Not Currently    Birth control/protection: Post-menopausal  Other Topics Concern   Not on file  Social History Narrative   Lives   Caffeine use:    Married. 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    Spends every 3rd night at [employer].    Has been taking care of mother since 49.    Social Drivers of Health   Tobacco Use: Medium Risk (12/24/2023)   Patient History    Smoking Tobacco  Use: Former    Smokeless Tobacco Use: Never    Passive Exposure: Not on Actuary Strain: Not on file  Food Insecurity: Not on file  Transportation Needs: Not on file  Physical Activity: Not on file  Stress: Not on file  Social Connections: Not on file  Depression (EYV7-0): Medium Risk (09/22/2023)   Depression (PHQ2-9)    PHQ-2 Score: 5  Alcohol Screen: Not on file  Housing: Not on file  Utilities: Not on file  Health Literacy: Not on file    Allergies: Allergies[1]  Metabolic Disorder Labs: Lab Results  Component Value Date   HGBA1C 5.5 11/06/2016   HGBA1C 5.5 11/06/2016   MPG 111 11/06/2016   No results found for: PROLACTIN Lab Results  Component Value Date   CHOL 149 10/30/2017   TRIG 73 10/30/2017   HDL 48 (L) 10/30/2017   CHOLHDL 3.1 10/30/2017   LDLCALC 85 10/30/2017   LDLCALC 189 (H) 07/10/2017   Lab Results  Component Value Date   TSH 0.98 10/30/2017   TSH 1.23 11/06/2016   TSH 1.23 11/06/2016    Therapeutic Level Labs: No results found for: LITHIUM No results found for: VALPROATE Lab Results  Component Value Date   CBMZ 7.8 07/29/2017  Current Medications: Current Outpatient Medications  Medication Sig Dispense Refill   aspirin EC 81 MG tablet Take 81 mg by mouth daily.     atorvastatin  (LIPITOR) 20 MG tablet Take 1 tablet (20 mg total) by mouth daily. 90 tablet 3   Biotin  5 MG TABS Use one po qd  0   buPROPion  (WELLBUTRIN  XL) 150 MG 24 hr tablet Take 1 tablet (150 mg total) by mouth daily. Total of 450 mg daily, along with 300 mg tab 90 tablet 1   buPROPion  (WELLBUTRIN  XL) 300 MG 24 hr tablet Take 1 tablet (300 mg total) by mouth daily. Take total of 450 mg daily, take along with 150 mg tab 90 tablet 1   chlorhexidine (PERIDEX) 0.12 % solution 5 mLs by Mouth Rinse route 2 (two) times daily.     chlorthalidone (HYGROTON) 25 MG tablet Take 25 mg by mouth daily.     Cholecalciferol 25 MCG (1000 UT) capsule Take 4,000 Units by  mouth daily.     gabapentin  (NEURONTIN ) 300 MG capsule TAKE TWO (2) CAPSULES BY MOUTH THREE TIMES DAILY. 180 capsule 1   hydrochlorothiazide  (HYDRODIURIL ) 25 MG tablet Take 1 tablet (25 mg total) by mouth daily. (Patient not taking: Reported on 12/24/2023) 90 tablet 1   HYDROcodone -acetaminophen  (NORCO/VICODIN) 5-325 MG tablet Take 1-2 tablets by mouth every 6 (six) hours as needed for moderate pain (pain score 4-6). 30 tablet 0   meloxicam  (MOBIC ) 15 MG tablet Take 1 tablet (15 mg total) by mouth daily. 30 tablet 3   metoprolol  succinate (TOPROL -XL) 50 MG 24 hr tablet Take 1 tablet (50 mg total) by mouth daily. Take with or immediately following a meal. 90 tablet 0   Multiple Vitamin (MULTIVITAMIN) capsule Take 1 capsule by mouth daily.     traZODone  (DESYREL ) 150 MG tablet Take 1 tablet (150 mg total) by mouth at bedtime as needed for sleep. 90 tablet 1   venlafaxine  XR (EFFEXOR -XR) 150 MG 24 hr capsule Take 1 capsule (150 mg total) by mouth daily. Start 75 mg daily for one week, then 150 mg daily for one week, then 225 mg daily. Take along with 75 mg cap 90 capsule 1   venlafaxine  XR (EFFEXOR -XR) 75 MG 24 hr capsule Take 1 capsule (75 mg total) by mouth daily with breakfast. Total of 225 mg daily. Take along with 150 mg cap 90 capsule 1   No current facility-administered medications for this visit.     Musculoskeletal: Strength & Muscle Tone: N/A Gait & Station: N/A Patient leans: N/A  Psychiatric Specialty Exam: Review of Systems  There were no vitals taken for this visit.There is no height or weight on file to calculate BMI.  General Appearance: {Appearance:22683}  Eye Contact:  {BHH EYE CONTACT:22684}  Speech:  Clear and Coherent  Volume:  Normal  Mood:  {BHH MOOD:22306}  Affect:  {Affect (PAA):22687}  Thought Process:  Coherent  Orientation:  Full (Time, Place, and Person)  Thought Content: Logical   Suicidal Thoughts:  {ST/HT (PAA):22692}  Homicidal Thoughts:  {ST/HT  (PAA):22692}  Memory:  Immediate;   Good  Judgement:  {Judgement (PAA):22694}  Insight:  {Insight (PAA):22695}  Psychomotor Activity:  Normal  Concentration:  Concentration: Good and Attention Span: Good  Recall:  Good  Fund of Knowledge: Good  Language: Good  Akathisia:  No  Handed:  Right  AIMS (if indicated): not done  Assets:  Communication Skills Desire for Improvement  ADL's:  Intact  Cognition: WNL  Sleep:  {  BHH GOOD/FAIR/POOR:22877}   Screenings: GAD-7    Garment/textile Technologist Visit from 09/22/2023 in [facility] Virtual [facility] Phone Follow Up from 10/29/2017 in [facility] Care Virtual [facility] Phone Follow Up from 09/25/2017 in [facility] Care Virtual [facility] Phone Follow Up from 05/28/2017 in [facility]  Total GAD-7 Score 6 12 17 14    PHQ2-9    Flowsheet Row Office Visit from 09/22/2023 in [facility] Office Visit from 08/05/2022 in [facility] Office Visit from 05/17/2021 in [facility] Video Visit from 10/10/2020 in [facility] Video Visit from 07/11/2020 in [facility]  PHQ-2 Total Score 2 2 2 1 1   PHQ-9 Total Score 5 6 4  -- --   Flowsheet Row Video Visit from 10/10/2020 in [facility] Video Visit from 04/13/2020 in [facility] Associates Virtual [facility] Phone Follow Up from 10/29/2017 in [facility] Primary Care  C-SSRS RISK CATEGORY No Risk No Risk No Risk     Assessment and Plan:  [name] is a 69 year old female with a history of depression,meralgia paresthetica, hyperlipidemia, who presents for follow up appointment for below.    2. MDD (major depressive disorder), recurrent episode, mild She  lost her mother in Dec 2022, sister in Dec 2023, uncle in June 2024 History: struggling with depression for many years. Not interested in therapy, feeling pressured to accomplish goal     Although she reports occasional down mood, she has been able to stay active since her last visit.  Will continue current dose of venlafaxine  to target depression and anxiety.  Will continue bupropion  as adjunctive treatment for depression, and BuSpar  for anxiety.    1. Insomnia, unspecified type Overall improving.  Will continue trazodone  as needed for insomnia.    Plan Continue venlafaxine  225 mg daily  Continue buspar  5 mg twice day  Continue  bupropion  450 mg daily  Continue Trazodone  150 mg at night  Next appointment:  1/16 at 9 am, video - on gabapentin  600 mg three times a day   Past trials of medication: sertraline , fluoxetine, venlafaxine , xanax, clonazepam , temazepam , Ambien   The patient demonstrates the following risk factors for suicide: Chronic risk factors for suicide include: psychiatric disorder of depression. Acute risk factors for suicide include: N/A. Protective factors for this patient include: coping skills and hope for the future. Considering these factors, the overall suicide risk at this point appears to be low. Patient is appropriate for outpatient follow up.  Collaboration of Care: Collaboration of Care: {BH OP Collaboration of Care:21014065}  Patient/Guardian was advised Release of Information must be obtained prior to any record release in order to collaborate their care with an outside provider. Patient/Guardian was advised if they have not already done so to contact the registration department to sign all necessary forms in order for us  to release information regarding their care.   Consent: Patient/Guardian gives verbal consent for treatment and assignment of benefits for services provided during this visit. Patient/Guardian expressed understanding and agreed to proceed.     Katheren Sleet, MD 02/29/2024, 11:18 AM     [1] No Known Allergies

## 2024-03-05 ENCOUNTER — Telehealth: Admitting: Psychiatry

## 2024-03-13 NOTE — Progress Notes (Unsigned)
 BH MD/PA/NP OP Progress Note  03/13/2024 6:22 PM Kashana Breach  MRN:  [id number]  Chief Complaint: No chief complaint on file.  HPI: *** Visit Diagnosis: No diagnosis found.  Past Psychiatric History: Please see initial evaluation for full details. I have reviewed the history. No updates at this time.     Past Medical History:  Past Medical History:  Diagnosis Date   Anxiety    Depression    GERD (gastroesophageal reflux disease)    Hypertension    Meralgia paresthetica of right side 06/05/2017    Past Surgical History:  Procedure Laterality Date   KNEE CARTILAGE SURGERY Right    Workers Compensation, fell out of chair at work.     Family Psychiatric History: Please see initial evaluation for full details. I have reviewed the history. No updates at this time.     Family History:  Family History  Problem Relation Age of Onset   Alzheimer's disease Mother    Hypertension Sister    Hypertension Sister    Diabetes Maternal Uncle     Social History:  Social History   Socioeconomic History   Marital status: Married    Spouse name: Not on file   Number of children: Not on file   Years of education: Not on file   Highest education level: Bachelor's degree (e.g., BA, AB, BS)  Occupational History   Not on file  Tobacco Use   Smoking status: Former    Current packs/day: 0.00    Types: Cigarettes    Quit date: 10/13/1983    Years since quitting: 40.4   Smokeless tobacco: Never  Substance and Sexual Activity   Alcohol use: No   Drug use: No   Sexual activity: Not Currently    Birth control/protection: Post-menopausal  Other Topics Concern   Not on file  Social History Narrative   Lives   Caffeine use:    Married. 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    Spends every 3rd night at [employer].    Has been taking care of mother since 36.    Social Drivers of Health   Tobacco Use: Medium Risk (12/24/2023)   Patient History    Smoking Tobacco  Use: Former    Smokeless Tobacco Use: Never    Passive Exposure: Not on Actuary Strain: Not on file  Food Insecurity: Not on file  Transportation Needs: Not on file  Physical Activity: Not on file  Stress: Not on file  Social Connections: Not on file  Depression (EYV7-0): Medium Risk (09/22/2023)   Depression (PHQ2-9)    PHQ-2 Score: 5  Alcohol Screen: Not on file  Housing: Not on file  Utilities: Not on file  Health Literacy: Not on file    Allergies: Allergies[1]  Metabolic Disorder Labs: Lab Results  Component Value Date   HGBA1C 5.5 11/06/2016   HGBA1C 5.5 11/06/2016   MPG 111 11/06/2016   No results found for: PROLACTIN Lab Results  Component Value Date   CHOL 149 10/30/2017   TRIG 73 10/30/2017   HDL 48 (L) 10/30/2017   CHOLHDL 3.1 10/30/2017   LDLCALC 85 10/30/2017   LDLCALC 189 (H) 07/10/2017   Lab Results  Component Value Date   TSH 0.98 10/30/2017   TSH 1.23 11/06/2016   TSH 1.23 11/06/2016    Therapeutic Level Labs: No results found for: LITHIUM No results found for: VALPROATE Lab Results  Component Value Date   CBMZ 7.8 07/29/2017  Current Medications: Current Outpatient Medications  Medication Sig Dispense Refill   aspirin EC 81 MG tablet Take 81 mg by mouth daily.     atorvastatin  (LIPITOR) 20 MG tablet Take 1 tablet (20 mg total) by mouth daily. 90 tablet 3   Biotin  5 MG TABS Use one po qd  0   buPROPion  (WELLBUTRIN  XL) 150 MG 24 hr tablet Take 1 tablet (150 mg total) by mouth daily. Total of 450 mg daily, along with 300 mg tab 90 tablet 1   buPROPion  (WELLBUTRIN  XL) 300 MG 24 hr tablet Take 1 tablet (300 mg total) by mouth daily. Take total of 450 mg daily, take along with 150 mg tab 90 tablet 1   chlorhexidine (PERIDEX) 0.12 % solution 5 mLs by Mouth Rinse route 2 (two) times daily.     chlorthalidone (HYGROTON) 25 MG tablet Take 25 mg by mouth daily.     Cholecalciferol 25 MCG (1000 UT) capsule Take 4,000 Units by  mouth daily.     gabapentin  (NEURONTIN ) 300 MG capsule TAKE TWO (2) CAPSULES BY MOUTH THREE TIMES DAILY. 180 capsule 1   hydrochlorothiazide  (HYDRODIURIL ) 25 MG tablet Take 1 tablet (25 mg total) by mouth daily. (Patient not taking: Reported on 12/24/2023) 90 tablet 1   HYDROcodone -acetaminophen  (NORCO/VICODIN) 5-325 MG tablet Take 1-2 tablets by mouth every 6 (six) hours as needed for moderate pain (pain score 4-6). 30 tablet 0   meloxicam  (MOBIC ) 15 MG tablet Take 1 tablet (15 mg total) by mouth daily. 30 tablet 3   metoprolol  succinate (TOPROL -XL) 50 MG 24 hr tablet Take 1 tablet (50 mg total) by mouth daily. Take with or immediately following a meal. 90 tablet 0   Multiple Vitamin (MULTIVITAMIN) capsule Take 1 capsule by mouth daily.     traZODone  (DESYREL ) 150 MG tablet Take 1 tablet (150 mg total) by mouth at bedtime as needed for sleep. 90 tablet 1   venlafaxine  XR (EFFEXOR -XR) 150 MG 24 hr capsule Take 1 capsule (150 mg total) by mouth daily. Start 75 mg daily for one week, then 150 mg daily for one week, then 225 mg daily. Take along with 75 mg cap 90 capsule 1   venlafaxine  XR (EFFEXOR -XR) 75 MG 24 hr capsule Take 1 capsule (75 mg total) by mouth daily with breakfast. Total of 225 mg daily. Take along with 150 mg cap 90 capsule 1   No current facility-administered medications for this visit.     Musculoskeletal: Strength & Muscle Tone: N/A Gait & Station: N/A Patient leans: N/A  Psychiatric Specialty Exam: Review of Systems  There were no vitals taken for this visit.There is no height or weight on file to calculate BMI.  General Appearance: {Appearance:22683}  Eye Contact:  {BHH EYE CONTACT:22684}  Speech:  Clear and Coherent  Volume:  Normal  Mood:  {BHH MOOD:22306}  Affect:  {Affect (PAA):22687}  Thought Process:  Coherent  Orientation:  Full (Time, Place, and Person)  Thought Content: Logical   Suicidal Thoughts:  {ST/HT (PAA):22692}  Homicidal Thoughts:  {ST/HT  (PAA):22692}  Memory:  Immediate;   Good  Judgement:  {Judgement (PAA):22694}  Insight:  {Insight (PAA):22695}  Psychomotor Activity:  Normal  Concentration:  Concentration: Good and Attention Span: Good  Recall:  Good  Fund of Knowledge: Good  Language: Good  Akathisia:  No  Handed:  Right  AIMS (if indicated): not done  Assets:  Communication Skills Desire for Improvement  ADL's:  Intact  Cognition: WNL  Sleep:  {  BHH GOOD/FAIR/POOR:22877}   Screenings: GAD-7    Garment/textile Technologist Visit from 09/22/2023 in [facility] Virtual [facility] Phone Follow Up from 10/29/2017 in [facility] Care Virtual [facility] Phone Follow Up from 09/25/2017 in [facility] Primary Care Virtual [facility] Phone Follow Up from 05/28/2017 in [facility]  Total GAD-7 Score 6 12 17 14    PHQ2-9    Flowsheet Row Office Visit from 09/22/2023 in [facility] Office Visit from 08/05/2022 in [facility] Office Visit from 05/17/2021 in [facility] Video Visit from 10/10/2020 in [facility] Video Visit from 07/11/2020 in [facility] Associates  PHQ-2 Total Score 2 2 2 1 1   PHQ-9 Total Score 5 6 4  -- --   Flowsheet Row Video Visit from 10/10/2020 in [facility] Video Visit from 04/13/2020 in [facility] Virtual [facility] Phone Follow Up from 10/29/2017 in [facility] Care  C-SSRS RISK CATEGORY No Risk No Risk No Risk     Assessment and Plan:  Malka Bocek is a 69 year old female with a history of depression,meralgia paresthetica, hyperlipidemia, who presents for follow up appointment for below.    2. MDD (major depressive disorder), recurrent episode, mild She  lost her mother in Dec 2022, sister in Dec 2023, uncle in June 2024 History: struggling with depression for many years. Not interested in therapy, feeling pressured to accomplish goal     Although she reports occasional down mood, she has been able to stay active since her last visit.  Will continue current dose of venlafaxine  to target depression and anxiety.  Will continue bupropion  as adjunctive treatment for depression, and BuSpar  for anxiety.    1. Insomnia, unspecified type Overall improving.  Will continue trazodone  as needed for insomnia.    Plan Continue venlafaxine  225 mg daily  Continue buspar  5 mg twice day  Continue  bupropion  450 mg daily  Continue Trazodone  150 mg at night  Next appointment:  1/16 at 9 am, video - on gabapentin  600 mg three times a day   Past trials of medication: sertraline , fluoxetine, venlafaxine , xanax, clonazepam , temazepam , Ambien   The patient demonstrates the following risk factors for suicide: Chronic risk factors for suicide include: psychiatric disorder of depression. Acute risk factors for suicide include: N/A. Protective factors for this patient include: coping skills and hope for the future. Considering these factors, the overall suicide risk at this point appears to be low. Patient is appropriate for outpatient follow up.  Collaboration of Care: Collaboration of Care: {BH OP Collaboration of Care:21014065}  Patient/Guardian was advised Release of Information must be obtained prior to any record release in order to collaborate their care with an outside provider. Patient/Guardian was advised if they have not already done so to contact the registration department to sign all necessary forms in order for us  to release information regarding their care.   Consent: Patient/Guardian gives verbal consent for treatment and assignment of benefits for services provided during this visit. Patient/Guardian expressed understanding and agreed to proceed.     Katheren Sleet, MD 03/13/2024, 6:22 PM      [1] No Known Allergies

## 2024-03-16 ENCOUNTER — Encounter: Payer: Self-pay | Admitting: Psychiatry

## 2024-03-16 ENCOUNTER — Telehealth: Admitting: Psychiatry

## 2024-03-16 DIAGNOSIS — G4700 Insomnia, unspecified: Secondary | ICD-10-CM | POA: Diagnosis not present

## 2024-03-16 DIAGNOSIS — F330 Major depressive disorder, recurrent, mild: Secondary | ICD-10-CM
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
## 2024-03-16 NOTE — Patient Instructions (Signed)
 Continue venlafaxine  225 mg daily  Continue buspar  5 mg twice day  Continue  bupropion  450 mg daily  Continue Trazodone  150 mg at night  Next appointment:  3/18 at 11 30

## 2024-03-17 ENCOUNTER — Telehealth: Admitting: Psychiatry

## 2024-05-05 ENCOUNTER — Telehealth: Admitting: Psychiatry
